# Patient Record
Sex: Female | Born: 1960 | Race: White | Hispanic: No | Marital: Married | Smoking: Former smoker
Health system: Southern US, Community
[De-identification: ages and names within clinical notes are randomized; demographics above are authoritative.]

## PROBLEM LIST (undated history)

## (undated) DIAGNOSIS — C801 Malignant (primary) neoplasm, unspecified: Secondary | ICD-10-CM

## (undated) DIAGNOSIS — I10 Essential (primary) hypertension: Secondary | ICD-10-CM

## (undated) DIAGNOSIS — Z8711 Personal history of peptic ulcer disease: Secondary | ICD-10-CM

## (undated) DIAGNOSIS — F419 Anxiety disorder, unspecified: Secondary | ICD-10-CM

## (undated) DIAGNOSIS — Z8719 Personal history of other diseases of the digestive system: Secondary | ICD-10-CM

## (undated) DIAGNOSIS — R87619 Unspecified abnormal cytological findings in specimens from cervix uteri: Secondary | ICD-10-CM

## (undated) DIAGNOSIS — IMO0002 Reserved for concepts with insufficient information to code with codable children: Secondary | ICD-10-CM

## (undated) DIAGNOSIS — D649 Anemia, unspecified: Secondary | ICD-10-CM

## (undated) DIAGNOSIS — F32A Depression, unspecified: Secondary | ICD-10-CM

## (undated) DIAGNOSIS — N9089 Other specified noninflammatory disorders of vulva and perineum: Secondary | ICD-10-CM

## (undated) DIAGNOSIS — G4730 Sleep apnea, unspecified: Secondary | ICD-10-CM

## (undated) DIAGNOSIS — N871 Moderate cervical dysplasia: Secondary | ICD-10-CM

## (undated) DIAGNOSIS — F329 Major depressive disorder, single episode, unspecified: Secondary | ICD-10-CM

## (undated) DIAGNOSIS — E46 Unspecified protein-calorie malnutrition: Secondary | ICD-10-CM

## (undated) HISTORY — DX: Moderate cervical dysplasia: N87.1

## (undated) HISTORY — PX: CHOLECYSTECTOMY: SHX55

## (undated) HISTORY — DX: Reserved for concepts with insufficient information to code with codable children: IMO0002

## (undated) HISTORY — DX: Unspecified abnormal cytological findings in specimens from cervix uteri: R87.619

## (undated) HISTORY — DX: Other specified noninflammatory disorders of vulva and perineum: N90.89

## (undated) HISTORY — DX: Unspecified protein-calorie malnutrition: E46

## (undated) HISTORY — DX: Essential (primary) hypertension: I10

## (undated) HISTORY — PX: REVISION GASTRIC RESTRICTIVE PROCEDURE FOR MORBID OBESITY: SUR1273

---
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
## 1999-09-02 HISTORY — PX: GASTRIC BYPASS OPEN: SUR638

## 2000-01-19 ENCOUNTER — Emergency Department (HOSPITAL_COMMUNITY): Admission: EM | Admit: 2000-01-19 | Discharge: 2000-01-19 | Payer: Self-pay | Admitting: Emergency Medicine

## 2000-01-19 ENCOUNTER — Encounter: Payer: Self-pay | Admitting: Emergency Medicine

## 2000-09-01 HISTORY — PX: LAPAROSCOPIC CHOLECYSTECTOMY: SUR755

## 2002-09-01 HISTORY — PX: ABDOMINOPLASTY: SUR9

## 2009-01-18 ENCOUNTER — Ambulatory Visit: Payer: Self-pay | Admitting: Hematology & Oncology

## 2009-06-01 HISTORY — PX: LEEP: SHX91

## 2009-08-27 ENCOUNTER — Emergency Department (HOSPITAL_COMMUNITY): Admission: EM | Admit: 2009-08-27 | Discharge: 2009-08-27 | Payer: Self-pay | Admitting: Emergency Medicine

## 2010-12-02 LAB — CBC
HCT: 39.7 % (ref 36.0–46.0)
Hemoglobin: 13.2 g/dL (ref 12.0–15.0)
MCHC: 33.3 g/dL (ref 30.0–36.0)
MCV: 84 fL (ref 78.0–100.0)
RDW: 14.6 % (ref 11.5–15.5)

## 2010-12-02 LAB — COMPREHENSIVE METABOLIC PANEL
Alkaline Phosphatase: 157 U/L — ABNORMAL HIGH (ref 39–117)
BUN: 12 mg/dL (ref 6–23)
Glucose, Bld: 119 mg/dL — ABNORMAL HIGH (ref 70–99)
Potassium: 3.2 mEq/L — ABNORMAL LOW (ref 3.5–5.1)
Total Protein: 6 g/dL (ref 6.0–8.3)

## 2010-12-02 LAB — URINALYSIS, ROUTINE W REFLEX MICROSCOPIC
Ketones, ur: 15 mg/dL — AB
Nitrite: NEGATIVE
Protein, ur: NEGATIVE mg/dL
Urobilinogen, UA: 1 mg/dL (ref 0.0–1.0)
pH: 6 (ref 5.0–8.0)

## 2010-12-02 LAB — DIFFERENTIAL
Basophils Absolute: 0.1 10*3/uL (ref 0.0–0.1)
Basophils Relative: 1 % (ref 0–1)
Monocytes Relative: 6 % (ref 3–12)
Neutro Abs: 8.2 10*3/uL — ABNORMAL HIGH (ref 1.7–7.7)
Neutrophils Relative %: 77 % (ref 43–77)

## 2010-12-02 LAB — POCT I-STAT, CHEM 8
BUN: 15 mg/dL (ref 6–23)
Chloride: 101 mEq/L (ref 96–112)
Creatinine, Ser: 0.6 mg/dL (ref 0.4–1.2)
Glucose, Bld: 122 mg/dL — ABNORMAL HIGH (ref 70–99)
Potassium: 3.1 mEq/L — ABNORMAL LOW (ref 3.5–5.1)

## 2010-12-02 LAB — LACTIC ACID, PLASMA: Lactic Acid, Venous: 1.3 mmol/L (ref 0.5–2.2)

## 2012-08-26 ENCOUNTER — Ambulatory Visit: Payer: Self-pay | Admitting: Family Medicine

## 2012-08-26 VITALS — BP 124/82 | HR 96 | Temp 98.2°F | Resp 18

## 2012-08-26 DIAGNOSIS — K121 Other forms of stomatitis: Secondary | ICD-10-CM

## 2012-08-26 DIAGNOSIS — K140 Glossitis: Secondary | ICD-10-CM

## 2012-08-26 DIAGNOSIS — K0510 Chronic gingivitis, plaque induced: Secondary | ICD-10-CM

## 2012-08-26 DIAGNOSIS — E538 Deficiency of other specified B group vitamins: Secondary | ICD-10-CM

## 2012-08-26 LAB — VITAMIN B12: Vitamin B-12: 382 pg/mL (ref 211–911)

## 2012-08-26 MED ORDER — CYANOCOBALAMIN 1000 MCG/ML IJ SOLN
1000.0000 ug | Freq: Once | INTRAMUSCULAR | Status: AC
  Administered 2012-08-26: 1000 ug via INTRAMUSCULAR

## 2012-08-26 NOTE — Progress Notes (Signed)
Is a 51 year old woman who years ago had a bypass surgery for obesity at which time she was wearing close to 400 pounds. She now weighs 87 pounds.  Over the last month she's developed stomatitis, glossitis, and gingivitis. The pain involves only her mouth but radiates into her years. She also has nasal source. She is miserable. She seen with her son.  Objective: Ulcerations in the nasal opening, gingivitis, stomatitis, and diffuse white patches at along the mucosa of her mouth.  Patient is no acute distress but she is obviously had significant wasting.  Assessment: Stomatitis, gingivitis  Plan: Diflucan 150 by mouth, B12 injections 1 mg every week x4, then every month  Differential Magic mouthwash 4 times a day, Hydromet when necessary every 4 hours

## 2012-09-01 DIAGNOSIS — Z0271 Encounter for disability determination: Secondary | ICD-10-CM

## 2012-09-02 ENCOUNTER — Telehealth: Payer: Self-pay

## 2012-09-02 MED ORDER — FLUCONAZOLE 150 MG PO TABS: 150.0000 mg | ORAL_TABLET | Freq: Once | ORAL | Status: DC

## 2012-09-02 NOTE — Telephone Encounter (Signed)
Patient asking for longer course of diflucan. Please advise.

## 2012-09-02 NOTE — Telephone Encounter (Signed)
I have sent 150mg  Diflucan to the pharmacy.  If she is still symptomatic in one week she will need to RTC for further evaluation

## 2012-09-02 NOTE — Telephone Encounter (Signed)
Yeast infection pills need more than one day to clear this up.   [phone]

## 2012-09-03 NOTE — Telephone Encounter (Signed)
Patient advised this was sent in.

## 2012-09-30 ENCOUNTER — Telehealth: Payer: Self-pay

## 2012-09-30 NOTE — Telephone Encounter (Signed)
Pt said we gave her needles and b12 stuff to take home with her she is out and has no insurance would like to know if she could have more of this [phone]

## 2012-09-30 NOTE — Telephone Encounter (Signed)
Pt said we gave her the materials to do her own b12 shots at home she is out and would like to know if we could supply this again please call [phone]

## 2012-10-01 ENCOUNTER — Telehealth: Payer: Self-pay

## 2012-10-01 MED ORDER — CYANOCOBALAMIN 1000 MCG/ML IJ SOLN: INTRAMUSCULAR | Status: DC

## 2012-10-01 MED ORDER — NEEDLES & SYRINGES MISC: Status: DC

## 2012-10-01 MED ORDER — CYANOCOBALAMIN 1000 MCG/ML IJ SOLN: 1000.0000 ug | Freq: Once | INTRAMUSCULAR | Status: DC

## 2012-10-01 NOTE — Telephone Encounter (Signed)
This is done, called patient to advise these were sent in to [facility]

## 2012-10-01 NOTE — Telephone Encounter (Signed)
Yes.  Please call in vitamin b12 vial of 10 cc, 1mg  per cc, and a box of needles.

## 2012-10-01 NOTE — Addendum Note (Signed)
Addended byCaffie Damme on: 10/01/2012 12:05 PM   Modules accepted: Orders

## 2012-10-01 NOTE — Telephone Encounter (Signed)
Pt called back and I advised her that her Rxs were sent to pharmacy.

## 2012-10-01 NOTE — Telephone Encounter (Signed)
Can we give to pt?

## 2012-10-01 NOTE — Telephone Encounter (Signed)
Pharmacist called for clarification on B12 Rx because it reads to inject "once". After reviewing OV notes and discussing w/Heather I am resending the Rx to read "once a month", which is the instr's in OV notes.

## 2012-10-05 ENCOUNTER — Ambulatory Visit: Payer: Self-pay | Admitting: Family Medicine

## 2012-10-05 VITALS — BP 146/87 | HR 87 | Temp 97.8°F | Resp 16 | Ht 64.75 in | Wt 101.4 lb

## 2012-10-05 DIAGNOSIS — B370 Candidal stomatitis: Secondary | ICD-10-CM

## 2012-10-05 DIAGNOSIS — K1379 Other lesions of oral mucosa: Secondary | ICD-10-CM

## 2012-10-05 DIAGNOSIS — N898 Other specified noninflammatory disorders of vagina: Secondary | ICD-10-CM

## 2012-10-05 MED ORDER — FLUCONAZOLE 150 MG PO TABS: 150.0000 mg | ORAL_TABLET | Freq: Once | ORAL | Status: DC

## 2012-10-05 MED ORDER — TRAMADOL HCL 50 MG PO TABS: 50.0000 mg | ORAL_TABLET | Freq: Three times a day (TID) | ORAL | Status: DC | PRN

## 2012-10-05 NOTE — Progress Notes (Signed)
52 yo woman with recurrence of mouth sores.  She did better with the Diflucan, but the soreness has returned.  Also, has sore on labia. Ears feel blocked. S/P gastric bypass, but weight has stabilized.  No polyuria  She also has left labial sore that has started as nonpainful growth about a year.  It has gotten sore over the last two months.  Objective:  NAD Broken tooth number 8, white patches in mouth c/w thrush. Ears: no cerumen, dull TM's   2 cm eroding lesion left labia with overlying yellow eschar  Assessment:  Vulvar cancer, thrush  Plan:  1. Thrush  fluconazole (DIFLUCAN) 150 MG tablet  2. Oral pain  traMADol (ULTRAM) 50 MG tablet  3. Vaginal lesion  Ambulatory referral to Gynecology

## 2012-10-20 ENCOUNTER — Telehealth: Payer: Self-pay

## 2012-10-20 NOTE — Telephone Encounter (Signed)
Pt is wanting someone to call [facility] to see if they can move up her appt from April for a lesion  Best number [phone]

## 2012-10-20 NOTE — Telephone Encounter (Signed)
Can you see if we can get her in sooner?  

## 2012-10-27 ENCOUNTER — Encounter: Payer: Self-pay | Admitting: Obstetrics & Gynecology

## 2012-11-03 ENCOUNTER — Telehealth: Payer: Self-pay

## 2012-11-03 DIAGNOSIS — K1379 Other lesions of oral mucosa: Secondary | ICD-10-CM

## 2012-11-03 MED ORDER — TRAMADOL HCL 50 MG PO TABS: 50.0000 mg | ORAL_TABLET | Freq: Three times a day (TID) | ORAL | Status: DC | PRN

## 2012-11-03 NOTE — Telephone Encounter (Signed)
He sent this in with 3 refills tramadol she is using this more than prescribed she uses this more frequently than every 8 hours. Please advise if we can send in something else for her.

## 2012-11-03 NOTE — Telephone Encounter (Signed)
Pt needs a refill on pain medication.  She is a patient of Dr. Milus Glazier.  She found out she has a tumor and won't be able to get in to see her other doctor for a while.  Hopes we can refill. [phone]

## 2012-11-03 NOTE — Telephone Encounter (Signed)
I have sent tramadol to the pharmacy.  Pt needs to take as directed.  And pt needs to f/u with Dr. Milus Glazier for more.

## 2012-11-04 NOTE — Telephone Encounter (Signed)
Spoke with pt, advised Rx sent to pharmacy and to follow up with Dr Milus Glazier.

## 2012-11-05 ENCOUNTER — Other Ambulatory Visit: Payer: Self-pay | Admitting: Family Medicine

## 2012-11-10 ENCOUNTER — Telehealth: Payer: Self-pay

## 2012-11-10 NOTE — Telephone Encounter (Signed)
Dr Milus Glazier  Patient does not have insurance  Requesting fluconazole (DIFLUCAN) 150 MG tablet - Was being treated for yeast infection in mouth.  Has an appointment next week with a specialist.   [phone]

## 2012-11-11 ENCOUNTER — Other Ambulatory Visit: Payer: Self-pay | Admitting: Family Medicine

## 2012-11-11 DIAGNOSIS — B370 Candidal stomatitis: Secondary | ICD-10-CM

## 2012-11-11 MED ORDER — FLUCONAZOLE 150 MG PO TABS: 150.0000 mg | ORAL_TABLET | Freq: Once | ORAL | Status: DC

## 2012-11-17 ENCOUNTER — Encounter: Payer: Self-pay | Admitting: Gynecologic Oncology

## 2012-11-18 ENCOUNTER — Encounter: Payer: Self-pay | Admitting: Gynecologic Oncology

## 2012-11-18 ENCOUNTER — Ambulatory Visit: Payer: Self-pay | Attending: Gynecologic Oncology | Admitting: Gynecologic Oncology

## 2012-11-18 VITALS — BP 120/74 | HR 86 | Temp 98.4°F | Resp 20 | Ht 66.0 in | Wt 99.7 lb

## 2012-11-18 DIAGNOSIS — C519 Malignant neoplasm of vulva, unspecified: Secondary | ICD-10-CM | POA: Insufficient documentation

## 2012-11-18 NOTE — Progress Notes (Signed)
Consult Note: Gyn-Onc  Autumn Turner 52 y.o. female  CC:  Chief Complaint  Patient presents with  . Vulvar Cancer    New patient    HPI: Patient is seen today in consultation at the request of Dr. Richardson for newly diagnosed vulvar cancer.  Patient is a 52-year-old gravida 2 has been having itching and pain in the vulva for more than 6 months. She states it would flare about every 3-4 months with burning and irritation and then go away. Past summer of 2013 it flared much more significantly and she had significant increase in pain and itching. She looks that the lesion with the mirror she sought white exudate and she took a fungal medication that needed improved. She got that was most likely yeast infection as the antifungal medication helped. She went back to the physician to get a refill on the fungal medication and her daughter encouraged her to have a pelvic exam. At that time the pelvic exam revealed a probable vulvar cancer she was referred to Dr. Richardson.  Dr. 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 Dr. Richardson was not able to do a Pap smear at that time as the patient refused. The patient is a history of abnormal Pap smears and underwent a LEEP in 2010 and had one normal Pap smear in 2012 in his had no followup since that time as she has lost her insurance. She underwent a gastric bypass in 2001 and in 2010 she had complications from her bypass to scarring and could not eat and became no nourished her weight is 85 pounds. She revision of her bypass in May of 2013 with lysis of adhesions and was able to gain back to just under 100 pounds has lost weight again. She states that she has fairly significant nausea she has difficulty keeping her food and without growing up. It sounds report that she has a gastric ulcers and may have developed a gastric outlet obstruction. Today her weight is fairly  stable.  Review of Systems: She denies any vaginal bleeding. She has a bleeding from the ulcer. She has a significant change about bladder habits. She does have gastric cramping and abdominal pain. She has pain from the lesion and she did not use the lidocaine jelly as it cannot really relieve her symptoms. She's never had a mammogram. Her last colonoscopy was in 2013. She does smoke and smokes half a pack to pack per day she's temperature 20 years. 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 She states that due to her malnutrition and weakness she's not able to work a full day.  Current Meds:  Outpatient Encounter Prescriptions as of 11/18/2012  Medication Sig Dispense Refill  . cyanocobalamin (,VITAMIN B-12,) 1000 MCG/ML injection Inject 1 mL (1,000 mcg total) into the muscle once a month.  10 mL  0  . Needles & Syringes MISC 3 cc syringes with 25 gauge  1/2 needle  1 each  0  . fluconazole (DIFLUCAN) 150 MG tablet Take 1 tablet (150 mg total) by mouth once. Repeat if needed  7 tablet  2  . traMADol (ULTRAM) 50 MG tablet Take 1 tablet (50 mg total) by mouth every 8 (eight) hours as needed for pain.  30 tablet  0   No facility-administered encounter medications   on file as of 11/18/2012.    Allergy:  Allergies  Allergen Reactions  . Ciprocinonide (Fluocinolone)   . Iohexol      Code: HIVES   . Nsaids     Due to Hx of gastric bypass    Social Hx:   History   Social History  . Marital Status: Divorced    Spouse Name: N/A    Number of Children: N/A  . Years of Education: N/A   Occupational History  . Not on file.   Social History Main Topics  . Smoking status: Current Every Day Smoker -- 0.50 packs/day for 20 years    Types: Cigarettes  . Smokeless tobacco: Not on file  . Alcohol Use: No  . Drug Use: No  . Sexually Active: Not on file   Other Topics Concern   . Not on file   Social History Narrative  . No narrative on file    Past Surgical Hx:  Past Surgical History  Procedure Laterality Date  . Cesarean section    . Gastric bypass  2001  . Gallbladder surgery  2002  . Tummy tuck  2004  . Leep  06/2009    Past Medical Hx:  Past Medical History  Diagnosis Date  . Hypertension   . Abnormal Pap smear   . Vulvar lesion   . CIN II (cervical intraepithelial neoplasia II)   . Malnourished     Family Hx:  Family History  Problem Relation Age of Onset  . Cancer Father   . Hypertension Father   . Lung cancer Father     Vitals:  Blood pressure 120/74, pulse 86, temperature 98.4 F (36.9 C), resp. rate 20, height 5' 6" (1.676 m), weight 99 lb 11.2 oz (45.224 kg).  Physical Exam: Thin ill-appearing female in no acute distress.  Neck: Supple, no lymphadenopathy, no thyromegaly.  Lungs: Clear to auscultation bilaterally.  Cardiac Astro: Regular rate rhythm.  Abdomen: Well-healed surgical incisions and drain sites. Abdomen is soft, nontender, nondistended. There is no palpable masses or hepatosplenomegaly. Evidence of a plastic surgery procedures evident.  Groins: 2 (2 pulse is no lymphadenopathy.  Extremities: No edema.  Pelvic: External genitalia is notable for a 3 x 2 cm ulcerative lesion arising in the left inferior aspect of the labia majora crossing the perineal body at the midline. There is approximate centimeter and a half from the anal verge. Patient is not tolerate a digital vaginal finger or speculum examination. Rectal was performed. Rectal on for this patient does have a rectocele grade 1 and on rectal exam I was able to lift up the vaginal mucosa showed that the vaginal mucosa is not involved.  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 I believe she is a candidate for a radical vulvectomy and bilateral groin dissection. I spoke to the patient regarding the  surgery and postoperative care that would be required. She understands this lesion does cross midline that she'll require a bilateral vulvectomy's with drains. She is prior experience managing and taking care of drains she's comfortable with this. We also discussed postoperative care. Her surgery is scheduled for April 8. She understands that Dr. Wendy Brewster will be her surgeon and she'll have the opportunity to see her the day of surgery. She will have a Pap smear under anesthesia. Her questions were elicited in answer to her satisfaction. Risks of surgery including bleeding, infection, injury to surrounding organs, vulvar breakdown, lymphedema were discussed with   the patient. She wishes to proceed. She was given a prescription today for narcotics. She'll come in for preop visit. Smoking cessation was encouraged.  Farhana Fellows A., MD 11/18/2012, 3:06 PM  

## 2012-11-18 NOTE — Patient Instructions (Signed)
Vulvectomy  Care After The vulva is the external female genitalia, outside and around the vagina and pubic bone. It consists of:  The skin on, and in front of, the pubic bone.  The clitoris.  The labia majora (large lips) on the outside of the vagina.  The labia minora (small lips) around the opening of the vagina.  The opening and the skin in and around the vagina. A vulvectomy is the removal of the tissue of the vulva, which sometimes includes removal of the lymph nodes and tissue in the groin areas. These discharge instructions provide you with general information on caring for yourself after you leave the hospital. It is also important that you know the warning signs of complications, so that you can seek treatment. Please read the instructions outlined below and refer to this sheet in the next few weeks. Your caregiver may also give you specific information and medicines. If you have any questions or complications after discharge, please call your caregiver. ACTIVITY  Rest as much as possible the first two weeks after discharge.  Arrange to have help from family or others with your daily activities when you go home.  Avoid heavy lifting (more than 5 pounds), pushing, or pulling.  If you feel tired, balance your activity with rest periods.  Follow your caregiver's instruction about climbing stairs and driving a car.  Increase activity gradually.  Do not exercise until you have permission from your caregiver. LEG AND FOOT CARE If your doctor has removed lymph nodes from your groin area, there may be an increase in swelling of your legs and feet. You can help prevent swelling by doing the following:  Elevate your legs while sitting or lying down.  If your caregiver has ordered special stockings, wear them according to instructions.  Avoid standing in one place for long periods of time.  Call the physical therapy department if you have any questions about swelling or treatment  for swelling.  Avoid salt in your diet. It can cause fluid retention and swelling.  Do not cross your legs, especially when sitting. NUTRITION  You may resume your normal diet.  Drink 6 to 8 glasses of fluids a day.  Eat a healthy, balanced diet including portions of food from the meat (protein), milk, fruit, vegetable, and bread groups.  Your caregiver may recommend you take a multivitamin with iron. ELIMINATION  You may notice that your stream of urine is at a different angle, and may tend to spray. Using a plastic funnel may help to decrease urine spray.  If constipation occurs, drink more liquids, and add more fruits, vegetables, and bran to your diet. You may take a mild laxative, such as Milk of Magnesia, Metamucil or a stool softener, such as Colace with permission from your caregiver. HYGIENE  You may shower and wash your hair.  Check with your caregiver about tub baths.  Do not add any bath oils or chemicals to your bath water, after you have permission to take baths.  Clean yourself well after moving your bowels.  After urinating, wipe from top to bottom.  A sitz bath will help keep your perineal area clean, reduce swelling, and provide comfort. HOME CARE INSTRUCTIONS   Take your temperature twice a day and record it, especially if you feel feverish or have chills.  Follow your caregiver's instructions about medicines, activity, and follow-up appointments after surgery.  Do not drink alcohol while taking pain medicine.  Change your dressing as advised by your caregiver.    You may take over-the-counter medicine for pain, recommended by your caregiver.  If your pain is not relieved with medicine, call your caregiver.  Do not take aspirin, because it can cause bleeding.  Do not douche or use tampons (use a non-perfumed sanitary pad).  Do not have sexual intercourse until your caregiver gives you permission. Hugging, kissing, and playful sexual activity is  fine with your caregiver's permission.  Warm sitz baths, with your caregiver's permission, are helpful to control swelling and discomfort.  Take showers instead of baths, until your caregiver gives you permission to take baths.  You may take a mild medicine for constipation, recommended by your caregiver. Bran foods and drinking a lot of fluids will help with constipation.  Make sure your family understands everything about your operation and recovery. SEEK MEDICAL CARE IF:   You notice swelling and redness around the wound area.  You notice a foul smell coming from the wound or on the surgical dressing.  You notice the wound is separating.  You have painful or bloody urination.  You develop nausea and vomiting.  You develop diarrhea.  You develop a rash.  You have a reaction or allergy from the medicine.  You feel dizzy or light headed.  You need stronger pain medicine. SEEK IMMEDIATE MEDICAL CARE IF:   You develop a temperature of 102 F (38.9 C) or higher.  You pass out.  You develop leg or chest pain.  You develop abdominal pain.  You develop shortness of breath.  You develop bleeding from the wound area.  You see pus in the wound area. MAKE SURE YOU:   Understand these instructions.  Will watch your condition.  Will get help right away if you are not doing well or get worse. Document Released: 04/01/2004 Document Revised: 11/10/2011 Document Reviewed: 07/20/2009 ExitCare Patient Information 2013 [employer].  

## 2012-11-29 ENCOUNTER — Encounter (HOSPITAL_COMMUNITY): Payer: Self-pay | Admitting: Pharmacy Technician

## 2012-12-02 ENCOUNTER — Other Ambulatory Visit (HOSPITAL_COMMUNITY): Payer: Self-pay | Admitting: Gynecologic Oncology

## 2012-12-02 NOTE — Patient Instructions (Signed)
[name]  12/02/2012   Your procedure is scheduled on: 12-07-2012  Report to [facility] at 1045 AM.  Call this number if you have problems the morning of surgery [phone]   Remember:   Do not eat food or drink liquids :After Midnight.     Take these medicines the morning of surgery with A SIP OF WATER:                                 SEE [facility] PREPARING FOR SURGERY SHEET   Do not wear jewelry, make-up or nail polish.  Do not wear lotions, powders, or perfumes. You may wear deodorant.   Men may shave face and neck.  Do not bring valuables to the hospital.  Contacts, dentures or bridgework may not be worn into surgery.  Leave suitcase in the car. After surgery it may be brought to your room.  For patients admitted to the hospital, checkout time is 11:00 AM the day of discharge.   Patients discharged the day of surgery will not be allowed to drive home.  Name and phone number of your driver:  Special Instructions: N/A   Please read over the following fact sheets that you were given: MRSA Information., incentive spirometer fact sheet Call Birdie Sons RN pre op nurse if needed [phone]    FAILURE TO FOLLOW THESE INSTRUCTIONS MAY RESULT IN THE CANCELLATION OF YOUR SURGERY. PATIENT SIGNATURE___________________________________________

## 2012-12-03 ENCOUNTER — Encounter (HOSPITAL_COMMUNITY)
Admission: RE | Admit: 2012-12-03 | Discharge: 2012-12-03 | Disposition: A | Discharge status: Home / Self Care | Payer: Self-pay | Source: Ambulatory Visit | Attending: Gynecologic Oncology | Admitting: Gynecologic Oncology

## 2012-12-03 ENCOUNTER — Encounter (HOSPITAL_COMMUNITY): Payer: Self-pay

## 2012-12-03 ENCOUNTER — Ambulatory Visit (HOSPITAL_COMMUNITY)
Admission: RE | Admit: 2012-12-03 | Discharge: 2012-12-03 | Disposition: A | Discharge status: Home / Self Care | Payer: Self-pay | Source: Ambulatory Visit | Attending: Gynecologic Oncology | Admitting: Gynecologic Oncology

## 2012-12-03 DIAGNOSIS — C519 Malignant neoplasm of vulva, unspecified: Secondary | ICD-10-CM | POA: Insufficient documentation

## 2012-12-03 DIAGNOSIS — R9431 Abnormal electrocardiogram [ECG] [EKG]: Secondary | ICD-10-CM | POA: Insufficient documentation

## 2012-12-03 DIAGNOSIS — Z01812 Encounter for preprocedural laboratory examination: Secondary | ICD-10-CM | POA: Insufficient documentation

## 2012-12-03 DIAGNOSIS — Z01810 Encounter for preprocedural cardiovascular examination: Secondary | ICD-10-CM | POA: Insufficient documentation

## 2012-12-03 DIAGNOSIS — Z01818 Encounter for other preprocedural examination: Secondary | ICD-10-CM | POA: Insufficient documentation

## 2012-12-03 HISTORY — DX: Malignant (primary) neoplasm, unspecified: C80.1

## 2012-12-03 HISTORY — DX: Anemia, unspecified: D64.9

## 2012-12-03 HISTORY — DX: Sleep apnea, unspecified: G47.30

## 2012-12-03 LAB — COMPREHENSIVE METABOLIC PANEL
ALT: 12 U/L (ref 0–35)
AST: 23 U/L (ref 0–37)
CO2: 32 mEq/L (ref 19–32)
Chloride: 104 mEq/L (ref 96–112)
GFR calc Af Amer: >90 mL/min (ref >90)
GFR calc non Af Amer: >90 mL/min (ref >90)
Glucose, Bld: 93 mg/dL (ref 70–99)
Sodium: 144 mEq/L (ref 135–145)
Total Bilirubin: 0.1 mg/dL — ABNORMAL LOW (ref 0.3–1.2)

## 2012-12-03 LAB — CBC WITH DIFFERENTIAL/PLATELET
Basophils Relative: 2 % — ABNORMAL HIGH (ref 0–1)
Eosinophils Relative: 6 % — ABNORMAL HIGH (ref 0–5)
Hemoglobin: 8.6 g/dL — ABNORMAL LOW (ref 12.0–15.0)
Lymphs Abs: 1.6 10*3/uL (ref 0.7–4.0)
MCH: 18 pg — ABNORMAL LOW (ref 26.0–34.0)
MCV: 64.5 fL — ABNORMAL LOW (ref 78.0–100.0)
Monocytes Absolute: 0.4 10*3/uL (ref 0.1–1.0)
Monocytes Relative: 7 % (ref 3–12)
Neutrophils Relative %: 59 % (ref 43–77)
RBC: 4.79 MIL/uL (ref 3.87–5.11)

## 2012-12-03 NOTE — Pre-Procedure Instructions (Addendum)
[name]  12/03/2012   Your procedure is scheduled on:  Tuesday, April 08,2014  Report to [facility] at  AM.1045  Call this number if you have problems the morning of surgery: [phone]   Remember:   Do not drink liquids or  eat food:After Midnight. Clear liquids until 0715 12-07-2012 then Nothing by mouth until surgery      Take these medicines the morning of surgery with A SIP OF WATER: Hydrocodone if needed                          SEE [facility] PREPARING FOR SURGERY SHEET    Do not wear jewelry, make-up or nail polish.  Do not wear lotions, powders, or perfumes. You may wear deodorant.             Men may shave face and neck.  Do not bring valuables to the hospital.  Contacts, dentures or bridgework may not be worn into surgery.  Leave suitcase in the car. After surgery it may be brought to your room.  For patients admitted to the hospital, checkout time is 11:00 AM the day of                         discharge.   Patients discharged the day of surgery will not be allowed to drive home.  Name and phone number of your driver: Lajuan Lines [phone]     Please read over the following fact sheets that you were given:MRSA Information,Incentive spirometry                    Call Jolyn Nap, RN pre op nurse if needed [phone]               FAILURE TO FOLLOW THESE INSTRUCTIONS MAY RESULT IN THE CANCELLATION OF YOUR SURGERY. PATIENT SIGNATURE___________________________________________________

## 2012-12-06 NOTE — Progress Notes (Addendum)
Late entry for 12-03-2012 abnormal labs reported to Dagmar Hait, R.N. For Dr. Nelly Rout CBC w diff, CMET and PCR screen. 12-06-2012 Spoke with Dr. Forrestine Him office this am for follow up no additional orders needed for surgery will proceed, aware that patient will be on contact isolation because of positive PCR screen started her treatment Friday evening. EKG confirmed from 12-03-2012  and CXR final report from  12-03-2012 in Epic.

## 2012-12-07 ENCOUNTER — Encounter (HOSPITAL_COMMUNITY)
Admission: RE | Disposition: A | Discharge status: Home / Self Care | Payer: Self-pay | Source: Ambulatory Visit | Attending: Gynecologic Oncology

## 2012-12-07 ENCOUNTER — Encounter (HOSPITAL_COMMUNITY): Payer: Self-pay | Admitting: *Deleted

## 2012-12-07 ENCOUNTER — Ambulatory Visit (HOSPITAL_COMMUNITY): Payer: Self-pay | Admitting: *Deleted

## 2012-12-07 ENCOUNTER — Ambulatory Visit (HOSPITAL_COMMUNITY)
Admission: RE | Admit: 2012-12-07 | Discharge: 2012-12-08 | Disposition: A | Discharge status: Home / Self Care | Payer: Self-pay | Source: Ambulatory Visit | Attending: Obstetrics & Gynecology | Admitting: Obstetrics & Gynecology

## 2012-12-07 DIAGNOSIS — R110 Nausea: Secondary | ICD-10-CM | POA: Insufficient documentation

## 2012-12-07 DIAGNOSIS — R5381 Other malaise: Secondary | ICD-10-CM | POA: Insufficient documentation

## 2012-12-07 DIAGNOSIS — E46 Unspecified protein-calorie malnutrition: Secondary | ICD-10-CM | POA: Insufficient documentation

## 2012-12-07 DIAGNOSIS — C519 Malignant neoplasm of vulva, unspecified: Secondary | ICD-10-CM

## 2012-12-07 DIAGNOSIS — K259 Gastric ulcer, unspecified as acute or chronic, without hemorrhage or perforation: Secondary | ICD-10-CM | POA: Insufficient documentation

## 2012-12-07 DIAGNOSIS — Z9884 Bariatric surgery status: Secondary | ICD-10-CM | POA: Insufficient documentation

## 2012-12-07 DIAGNOSIS — I10 Essential (primary) hypertension: Secondary | ICD-10-CM | POA: Insufficient documentation

## 2012-12-07 HISTORY — PX: VULVECTOMY: SHX1086

## 2012-12-07 SURGERY — VULVECTOMY, WITH LYMPHADENECTOMY
Anesthesia: General | Laterality: Bilateral | Wound class: Clean Contaminated

## 2012-12-07 MED ORDER — NEOSTIGMINE METHYLSULFATE 1 MG/ML IJ SOLN
INTRAMUSCULAR | Status: DC | PRN
  Administered 2012-12-07: 3 mg via INTRAVENOUS

## 2012-12-07 MED ORDER — BUPIVACAINE HCL (PF) 0.25 % IJ SOLN
INTRAMUSCULAR | Status: DC | PRN
  Administered 2012-12-07: 10 mL

## 2012-12-07 MED ORDER — HYDROMORPHONE HCL PF 1 MG/ML IJ SOLN
0.5000 mg | INTRAMUSCULAR | Status: DC | PRN
  Administered 2012-12-08: 1 mg via INTRAVENOUS
  Filled 2012-12-07 (×2): qty 1

## 2012-12-07 MED ORDER — FENTANYL CITRATE 0.05 MG/ML IJ SOLN: 25.0000 ug | INTRAMUSCULAR | Status: DC | PRN

## 2012-12-07 MED ORDER — HYDROMORPHONE HCL PF 1 MG/ML IJ SOLN
INTRAMUSCULAR | Status: DC | PRN
  Administered 2012-12-07 (×2): 1 mg via INTRAVENOUS

## 2012-12-07 MED ORDER — ACETAMINOPHEN 10 MG/ML IV SOLN
INTRAVENOUS | Status: AC
  Filled 2012-12-07: qty 100

## 2012-12-07 MED ORDER — ACETAMINOPHEN 10 MG/ML IV SOLN
INTRAVENOUS | Status: DC | PRN
  Administered 2012-12-07: 1000 mg via INTRAVENOUS

## 2012-12-07 MED ORDER — MIDAZOLAM HCL 5 MG/5ML IJ SOLN
INTRAMUSCULAR | Status: DC | PRN
  Administered 2012-12-07: 2 mg via INTRAVENOUS

## 2012-12-07 MED ORDER — LACTATED RINGERS IV SOLN
INTRAVENOUS | Status: DC | PRN
  Administered 2012-12-07 (×2): via INTRAVENOUS

## 2012-12-07 MED ORDER — LIDOCAINE HCL (PF) 2 % IJ SOLN
INTRAMUSCULAR | Status: DC | PRN
  Administered 2012-12-07: 10 mL

## 2012-12-07 MED ORDER — ACETAMINOPHEN 10 MG/ML IV SOLN
1000.0000 mg | Freq: Four times a day (QID) | INTRAVENOUS | Status: DC
  Administered 2012-12-07: 1000 mg via INTRAVENOUS
  Filled 2012-12-07 (×2): qty 100

## 2012-12-07 MED ORDER — LIDOCAINE HCL 2 % IJ SOLN
INTRAMUSCULAR | Status: AC
  Filled 2012-12-07: qty 20

## 2012-12-07 MED ORDER — OXYCODONE-ACETAMINOPHEN 5-325 MG PO TABS
1.0000 | ORAL_TABLET | ORAL | Status: DC | PRN
  Administered 2012-12-07 (×2): 2 via ORAL
  Administered 2012-12-08: 1 via ORAL
  Administered 2012-12-08 (×3): 2 via ORAL
  Filled 2012-12-07: qty 2
  Filled 2012-12-07: qty 1
  Filled 2012-12-07 (×4): qty 2

## 2012-12-07 MED ORDER — GLYCOPYRROLATE 0.2 MG/ML IJ SOLN
INTRAMUSCULAR | Status: DC | PRN
  Administered 2012-12-07: 0.4 mg via INTRAVENOUS

## 2012-12-07 MED ORDER — FENTANYL CITRATE 0.05 MG/ML IJ SOLN
INTRAMUSCULAR | Status: AC
  Filled 2012-12-07: qty 2

## 2012-12-07 MED ORDER — HYDROMORPHONE HCL PF 1 MG/ML IJ SOLN
0.2500 mg | INTRAMUSCULAR | Status: DC | PRN
  Administered 2012-12-07: 0.5 mg via INTRAVENOUS

## 2012-12-07 MED ORDER — ROCURONIUM BROMIDE 100 MG/10ML IV SOLN
INTRAVENOUS | Status: DC | PRN
  Administered 2012-12-07: 40 mg via INTRAVENOUS

## 2012-12-07 MED ORDER — DEXAMETHASONE SODIUM PHOSPHATE 10 MG/ML IJ SOLN
INTRAMUSCULAR | Status: DC | PRN
  Administered 2012-12-07: 5 mg via INTRAVENOUS

## 2012-12-07 MED ORDER — ONDANSETRON HCL 4 MG/2ML IJ SOLN
INTRAMUSCULAR | Status: DC | PRN
  Administered 2012-12-07: 4 mg via INTRAVENOUS

## 2012-12-07 MED ORDER — KCL IN DEXTROSE-NACL 20-5-0.45 MEQ/L-%-% IV SOLN
INTRAVENOUS | Status: DC
  Administered 2012-12-07 – 2012-12-08 (×2): via INTRAVENOUS
  Filled 2012-12-07 (×4): qty 1000

## 2012-12-07 MED ORDER — PROPOFOL 10 MG/ML IV BOLUS
INTRAVENOUS | Status: DC | PRN
  Administered 2012-12-07: 150 mg via INTRAVENOUS

## 2012-12-07 MED ORDER — LIDOCAINE HCL (CARDIAC) 20 MG/ML IV SOLN
INTRAVENOUS | Status: DC | PRN
  Administered 2012-12-07: 50 mg via INTRAVENOUS

## 2012-12-07 MED ORDER — PROMETHAZINE HCL 25 MG/ML IJ SOLN: 6.2500 mg | INTRAMUSCULAR | Status: DC | PRN

## 2012-12-07 MED ORDER — HYDROMORPHONE HCL PF 1 MG/ML IJ SOLN
INTRAMUSCULAR | Status: AC
  Filled 2012-12-07: qty 1

## 2012-12-07 MED ORDER — LACTATED RINGERS IV SOLN: INTRAVENOUS | Status: DC

## 2012-12-07 MED ORDER — ZOLPIDEM TARTRATE 5 MG PO TABS: 5.0000 mg | ORAL_TABLET | Freq: Every evening | ORAL | Status: DC | PRN

## 2012-12-07 MED ORDER — BUPIVACAINE HCL (PF) 0.25 % IJ SOLN
INTRAMUSCULAR | Status: AC
  Filled 2012-12-07: qty 30

## 2012-12-07 MED ORDER — MEPERIDINE HCL 50 MG/ML IJ SOLN: 6.2500 mg | INTRAMUSCULAR | Status: DC | PRN

## 2012-12-07 MED ORDER — PHENYLEPHRINE HCL 10 MG/ML IJ SOLN
INTRAMUSCULAR | Status: DC | PRN
  Administered 2012-12-07: 80 ug via INTRAVENOUS

## 2012-12-07 MED ORDER — FENTANYL CITRATE 0.05 MG/ML IJ SOLN
INTRAMUSCULAR | Status: DC | PRN
  Administered 2012-12-07 (×7): 50 ug via INTRAVENOUS

## 2012-12-07 SURGICAL SUPPLY — 42 items
APL SKNCLS STERI-STRIP NONHPOA (GAUZE/BANDAGES/DRESSINGS) ×2
BANDAGE GAUZE ELAST BULKY 4 IN (GAUZE/BANDAGES/DRESSINGS) ×1 IMPLANT
BENZOIN TINCTURE PRP APPL 2/3 (GAUZE/BANDAGES/DRESSINGS) ×2 IMPLANT
BLADE SURG 15 STRL LF DISP TIS (BLADE) ×2 IMPLANT
BLADE SURG 15 STRL SS (BLADE) ×2
CANISTER SUCTION 2500CC (MISCELLANEOUS) ×2 IMPLANT
CLOTH BEACON ORANGE TIMEOUT ST (SAFETY) ×2 IMPLANT
CLSR STERI-STRIP ANTIMIC 1/2X4 (GAUZE/BANDAGES/DRESSINGS) ×1 IMPLANT
COVER SURGICAL LIGHT HANDLE (MISCELLANEOUS) ×1 IMPLANT
DRAIN CHANNEL 10M FLAT 3/4 FLT (DRAIN) ×4 IMPLANT
DRAPE LG THREE QUARTER DISP (DRAPES) ×4 IMPLANT
DRSG TEGADERM 4X4.75 (GAUZE/BANDAGES/DRESSINGS) ×1 IMPLANT
ELECT NEEDLE TIP 2.8 STRL (NEEDLE) ×2 IMPLANT
EVACUATOR SILICONE 100CC (DRAIN) ×4 IMPLANT
GLOVE BIO SURGEON STRL SZ 6.5 (GLOVE) ×1 IMPLANT
GLOVE BIO SURGEON STRL SZ7.5 (GLOVE) ×7 IMPLANT
GLOVE BIOGEL PI IND STRL 6.5 (GLOVE) ×2 IMPLANT
GLOVE BIOGEL PI IND STRL 7.5 (GLOVE) IMPLANT
GLOVE BIOGEL PI INDICATOR 6.5 (GLOVE) ×2
GLOVE BIOGEL PI INDICATOR 7.5 (GLOVE) ×3
GLOVE INDICATOR 8.0 STRL GRN (GLOVE) ×2 IMPLANT
GOWN PREVENTION PLUS XLARGE (GOWN DISPOSABLE) ×1 IMPLANT
GOWN STRL NON-REIN LRG LVL3 (GOWN DISPOSABLE) ×8 IMPLANT
NDL SAFETY ECLIPSE 18X1.5 (NEEDLE) IMPLANT
NEEDLE HYPO 18GX1.5 SHARP (NEEDLE) ×2
NS IRRIG 1000ML POUR BTL (IV SOLUTION) ×1 IMPLANT
PACK COOL COMFORT PERI COLD (SET/KITS/TRAYS/PACK) ×6 IMPLANT
PACK MINOR VAGINAL W LONG (CUSTOM PROCEDURE TRAY) ×2 IMPLANT
PENCIL BUTTON HOLSTER BLD 10FT (ELECTRODE) ×2 IMPLANT
SHEET LAVH (DRAPES) ×1 IMPLANT
SPONGE GAUZE 4X4 12PLY (GAUZE/BANDAGES/DRESSINGS) ×2 IMPLANT
SPONGE LAP 18X18 X RAY DECT (DISPOSABLE) ×4 IMPLANT
STRIP CLOSURE SKIN 1/2X4 (GAUZE/BANDAGES/DRESSINGS) ×2 IMPLANT
SUT ETHILON 3 0 PS 1 (SUTURE) ×4 IMPLANT
SUT MNCRL AB 3-0 PS2 18 (SUTURE) ×4 IMPLANT
SUT VIC AB 2-0 SH 27 (SUTURE) ×14
SUT VIC AB 2-0 SH 27X BRD (SUTURE) IMPLANT
SUT VIC AB 3-0 SH 27 (SUTURE) ×8
SUT VIC AB 3-0 SH 27X BRD (SUTURE) IMPLANT
SYR CONTROL 10ML LL (SYRINGE) ×2 IMPLANT
TOWEL NATURAL 10PK STERILE (DISPOSABLE) ×1 IMPLANT
TOWEL OR NON WOVEN STRL DISP B (DISPOSABLE) ×2 IMPLANT

## 2012-12-07 NOTE — Op Note (Signed)
3:24 PM  PATIENT:  Autumn Turner  52 y.o. female  PRE-OPERATIVE DIAGNOSIS:  VULVAR CANCER   POST-OPERATIVE DIAGNOSIS:  VULVAR CANCER   PROCEDURE:  Procedure(s): VULVECTOMY AND BILATERAL INGUINAL LYMPH NODE DISSECTION, PAP SMEAR (Bilateral) Pap test  SURGEON:  Surgeon(s) and Role:    * Laurette Schimke, MD - Primary    * Antionette Char, MD - Assisting  ANESTHESIA:   General  FINDINGS:  3 cm perineal mass predominantly left-sided.  No inguinal inguinal adenopathy normal appearing cervix;  PROCEDURE:  Autumn Turner was taken to the operating room and the appropriate time out procedures performed. She was intubated and then placed in the dorsolithotomy position.  A Pap test was collected inguinal lymph nodes were assessed and were noted to be negative she was then prepped and draped in usual sterile fashion.  An 8 cm incision was made in the right inguinal crease. Dissection was taken down to the adductor muscle fascia and the sartorius muscle fascia. Skin flaps were created and were inferior to Scarpa's fascia.  Nodal tissue at this period aspect of the inguinal canal was dissected. Nodal tissue deep within the femoral triangle was also dissected. Large vessels were ligated. The saphenous vein was identified as it coursed towards the inguinal fossa. Nodal tissue was dissected off this vessel and the vein was preserved. The nodal tissue medial to the vessels in no case area were resected .  The surgical site was copiously irrigated and drained.  A JP drain was placed within the area of dissection. The drain was secured with nylon. The subcutaneou tissues were reapproximated with a running 2-0 Vicryl suture.  The skin was closed with a running subcuticular 3-0 Vicryl.  On the left similarly an 8 cm incision was made. Flaps were created both inferiorly and superiorly for a depth of at least 4 mm an inferior to Scarpa's fascia.  Nodal tissue was removed from the area of the inguinal canal and  to the femoral triangle. The left saphenous vein was identified as it coursed medially to the inguinal fossa. The saphenous vein was preserved. Tributaries to this vessel were ligated with suture. The nodal tissue medial to the inguinal vein that was resected as was the tissue in the area for case node. The area was copiously irrigated and drained hemostasis was assured. A flat J-P drain was then placed in the area of dissection and TRAM secured to the skin with nylon. The subcutaneous tissue was reapproximated with a running 2-0 Vicryl suture and the skin was closed uterine subcuticular 3-0 Vicryl suture.  Dressings were placed over the inguinal incision and attention was turned inferiorly.   A perineal block was administered with Marcaine and lidocaine.  The perineal lesion was appreciated and a margin of  1.5 - 2 cm was obtained circumferentially in an elliptical fashion. The digit placed in the rectum the dissection was continued inferiorly and care taken not to enter the rectum. Hemostasis was assured with the the Bovie cautery or placement of figure-of-eight suture. The deep  dissection was taken to the perineal aponeurosis. The dissection was copiously irrigated and hemostasis was assured. The defect was closed in several layers. Deep interrupted layers of interrupted 2-0 Vicryl. The skin edge and vaginal which were approximated with undyed 3-0 Vicryl suture.   EBL:  Total I/O In: 1000 [I.V.:1000] Out: 250 [Urine:200; Blood:50]  BLOOD ADMINISTERED:none  DRAINS: Penrose drain in the bilateral groins   LOCAL MEDICATIONS USED:  MARCAINE  and LIDOCAINE   SPECIMEN: :  Bilateral inguinal lymph nodes; posterior radical hemipelvectomy, Pap test  DISPOSITION OF SPECIMEN:  PATHOLOGY  COUNTS:  YES  PLAN OF CARE: Admit for overnight observation  PATIENT DISPOSITION:  PACU - hemodynamically stable.

## 2012-12-07 NOTE — H&P (View-Only) (Signed)
Consult Note: Gyn-Onc  Autumn Turner 52 y.o. female  CC:  Chief Complaint  Patient presents with  . Vulvar Cancer    New patient    HPI: Patient is seen today in consultation at the request of Dr. Senaida Ores for newly diagnosed vulvar cancer.  Patient is a 52 year old gravida 2 has been having itching and pain in the vulva for more than 6 months. She states it would flare about every 3-4 months with burning and irritation and then go away. Past summer of 2013 it flared much more significantly and she had significant increase in pain and itching. She looks that the lesion with the mirror she sought white exudate and she took a fungal medication that needed improved. She got that was most likely yeast infection as the antifungal medication helped. She went back to the physician to get a refill on the fungal medication and her daughter encouraged her to have a pelvic exam. At that time the pelvic exam revealed a probable vulvar cancer she was referred to Dr. Senaida Ores.  Dr. 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 Dr. Senaida Ores was not able to do a Pap smear at that time as the patient refused. The patient is a history of abnormal Pap smears and underwent a LEEP in 2010 and had one normal Pap smear in 2012 in his had no followup since that time as she has lost her insurance. She underwent a gastric bypass in 2001 and in 2010 she had complications from her bypass to scarring and could not eat and became no nourished her weight is 85 pounds. She revision of her bypass in May of 2013 with lysis of adhesions and was able to gain back to just under 100 pounds has lost weight again. She states that she has fairly significant nausea she has difficulty keeping her food and without growing up. It sounds report that she has a gastric ulcers and may have developed a gastric outlet obstruction. Today her weight is fairly  stable.  Review of Systems: She denies any vaginal bleeding. She has a bleeding from the ulcer. She has a significant change about bladder habits. She does have gastric cramping and abdominal pain. She has pain from the lesion and she did not use the lidocaine jelly as it cannot really relieve her symptoms. She's never had a mammogram. Her last colonoscopy was in 2013. She does smoke and smokes half a pack to pack per day she's temperature 20 years. 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 She states that due to her malnutrition and weakness she's not able to work a full day.  Current Meds:  Outpatient Encounter Prescriptions as of 11/18/2012  Medication Sig Dispense Refill  . cyanocobalamin (,VITAMIN B-12,) 1000 MCG/ML injection Inject 1 mL (1,000 mcg total) into the muscle once a month.  10 mL  0  . Needles & Syringes MISC 3 cc syringes with 25 gauge  1/2 needle  1 each  0  . fluconazole (DIFLUCAN) 150 MG tablet Take 1 tablet (150 mg total) by mouth once. Repeat if needed  7 tablet  2  . traMADol (ULTRAM) 50 MG tablet Take 1 tablet (50 mg total) by mouth every 8 (eight) hours as needed for pain.  30 tablet  0   No facility-administered encounter medications  on file as of 11/18/2012.    Allergy:  Allergies  Allergen Reactions  . Ciprocinonide (Fluocinolone)   . Iohexol      Code: HIVES   . Nsaids     Due to Hx of gastric bypass    Social Hx:   History   Social History  . Marital Status: Divorced    Spouse Name: N/A    Number of Children: N/A  . Years of Education: N/A   Occupational History  . Not on file.   Social History Main Topics  . Smoking status: Current Every Day Smoker -- 0.50 packs/day for 20 years    Types: Cigarettes  . Smokeless tobacco: Not on file  . Alcohol Use: No  . Drug Use: No  . Sexually Active: Not on file   Other Topics Concern   . Not on file   Social History Narrative  . No narrative on file    Past Surgical Hx:  Past Surgical History  Procedure Laterality Date  . Cesarean section    . Gastric bypass  2001  . Gallbladder surgery  2002  . Tummy tuck  2004  . Leep  06/2009    Past Medical Hx:  Past Medical History  Diagnosis Date  . Hypertension   . Abnormal Pap smear   . Vulvar lesion   . CIN II (cervical intraepithelial neoplasia II)   . Malnourished     Family Hx:  Family History  Problem Relation Age of Onset  . Cancer Father   . Hypertension Father   . Lung cancer Father     Vitals:  Blood pressure 120/74, pulse 86, temperature 98.4 F (36.9 C), resp. rate 20, height 5\' 6"  (1.676 m), weight 99 lb 11.2 oz (45.224 kg).  Physical Exam: Thin ill-appearing female in no acute distress.  Neck: Supple, no lymphadenopathy, no thyromegaly.  Lungs: Clear to auscultation bilaterally.  Cardiac Astro: Regular rate rhythm.  Abdomen: Well-healed surgical incisions and drain sites. Abdomen is soft, nontender, nondistended. There is no palpable masses or hepatosplenomegaly. Evidence of a plastic surgery procedures evident.  Groins: 2 (2 pulse is no lymphadenopathy.  Extremities: No edema.  Pelvic: External genitalia is notable for a 3 x 2 cm ulcerative lesion arising in the left inferior aspect of the labia majora crossing the perineal body at the midline. There is approximate centimeter and a half from the anal verge. Patient is not tolerate a digital vaginal finger or speculum examination. Rectal was performed. Rectal on for this patient does have a rectocele grade 1 and on rectal exam I was able to lift up the vaginal mucosa showed that the vaginal mucosa is not involved.  Assessment/Plan:  52 year old with a stage clinical stage IB invasive squamous cell carcinoma vulva. I believe she is a candidate for a radical vulvectomy and bilateral groin dissection. I spoke to the patient regarding the  surgery and postoperative care that would be required. She understands this lesion does cross midline that she'll require a bilateral vulvectomy's with drains. She is prior experience managing and taking care of drains she's comfortable with this. We also discussed postoperative care. Her surgery is scheduled for April 8. She understands that Dr. Laurette Schimke will be her surgeon and she'll have the opportunity to see her the day of surgery. She will have a Pap smear under anesthesia. Her questions were elicited in answer to her satisfaction. Risks of surgery including bleeding, infection, injury to surrounding organs, vulvar breakdown, lymphedema were discussed with  the patient. She wishes to proceed. She was given a prescription today for narcotics. She'll come in for preop visit. Smoking cessation was encouraged.  Leilene Diprima A., MD 11/18/2012, 3:06 PM

## 2012-12-07 NOTE — Interval H&P Note (Signed)
History and Physical Interval Note:  12/07/2012 11:43 AM  Autumn Turner  has presented today for surgery, with the diagnosis of VULVAR CANCER   The various methods of treatment have been discussed with the patient and family. After consideration of risks, benefits and other options for treatment, the patient has consented to  Procedure(s): VULVECTOMY AND BILATERAL INGUINAL LYMPH NODE DISSECTION (Bilateral) as a surgical intervention .  The patient's history has been reviewed, patient examined, no change in status, stable for surgery.  I have reviewed the patient's chart and labs.  Questions were answered to the patient's satisfaction.     [place], [facility]

## 2012-12-07 NOTE — Transfer of Care (Signed)
Immediate Anesthesia Transfer of Care Note  Patient: Autumn Turner  Procedure(s) Performed: Procedure(s): VULVECTOMY AND BILATERAL INGUINAL LYMPH NODE DISSECTION, PAP SMEAR (Bilateral)  Patient Location: PACU  Anesthesia Type:General  Level of Consciousness: awake, alert , oriented and patient cooperative  Airway & Oxygen Therapy: Patient Spontanous Breathing and Patient connected to face mask oxygen  Post-op Assessment: Report given to PACU RN, Post -op Vital signs reviewed and stable and Patient moving all extremities X 4  Post vital signs: stable  Complications: No apparent anesthesia complications

## 2012-12-07 NOTE — Anesthesia Preprocedure Evaluation (Signed)
Anesthesia Evaluation  Patient identified by MRN, date of birth, ID band Patient awake    Reviewed: Allergy & Precautions, H&P , NPO status , Patient's Chart, lab work & pertinent test results  Airway Mallampati: II TM Distance: >3 FB Neck ROM: Full    Dental no notable dental hx.    Pulmonary neg pulmonary ROS, neg sleep apnea,  breath sounds clear to auscultation  Pulmonary exam normal       Cardiovascular hypertension, Pt. on medications Rhythm:Regular Rate:Normal     Neuro/Psych negative neurological ROS  negative psych ROS   GI/Hepatic negative GI ROS, Neg liver ROS,   Endo/Other  negative endocrine ROS  Renal/GU negative Renal ROS  negative genitourinary   Musculoskeletal negative musculoskeletal ROS (+)   Abdominal   Peds negative pediatric ROS (+)  Hematology negative hematology ROS (+)   Anesthesia Other Findings Chipped upper front teeth  Reproductive/Obstetrics negative OB ROS                           Anesthesia Physical Anesthesia Plan  ASA: III  Anesthesia Plan: General   Post-op Pain Management:    Induction: Intravenous  Airway Management Planned: LMA  Additional Equipment:   Intra-op Plan:   Post-operative Plan: Extubation in OR  Informed Consent: I have reviewed the patients History and Physical, chart, labs and discussed the procedure including the risks, benefits and alternatives for the proposed anesthesia with the patient or authorized representative who has indicated his/her understanding and acceptance.   Dental advisory given  Plan Discussed with: CRNA  Anesthesia Plan Comments:         Anesthesia Quick Evaluation

## 2012-12-07 NOTE — Anesthesia Postprocedure Evaluation (Signed)
  Anesthesia Post-op Note  Patient: Autumn Turner  Procedure(s) Performed: Procedure(s) (LRB): VULVECTOMY AND BILATERAL INGUINAL LYMPH NODE DISSECTION, PAP SMEAR (Bilateral)  Patient Location: PACU  Anesthesia Type: General  Level of Consciousness: awake and alert   Airway and Oxygen Therapy: Patient Spontanous Breathing  Post-op Pain: mild  Post-op Assessment: Post-op Vital signs reviewed, Patient's Cardiovascular Status Stable, Respiratory Function Stable, Patent Airway and No signs of Nausea or vomiting  Last Vitals:  Filed Vitals:   12/07/12 1630  BP: 136/68  Pulse: 66  Temp: 36.7 C  Resp: 14    Post-op Vital Signs: stable   Complications: No apparent anesthesia complications

## 2012-12-08 ENCOUNTER — Encounter: Payer: Self-pay | Admitting: *Deleted

## 2012-12-08 ENCOUNTER — Encounter (HOSPITAL_COMMUNITY): Payer: Self-pay | Admitting: Gynecologic Oncology

## 2012-12-08 LAB — BASIC METABOLIC PANEL
Calcium: 8.3 mg/dL — ABNORMAL LOW (ref 8.4–10.5)
Creatinine, Ser: 0.4 mg/dL — ABNORMAL LOW (ref 0.50–1.10)
GFR calc non Af Amer: >90 mL/min (ref >90)
Glucose, Bld: 117 mg/dL — ABNORMAL HIGH (ref 70–99)
Sodium: 141 mEq/L (ref 135–145)

## 2012-12-08 LAB — CBC
MCH: 18.3 pg — ABNORMAL LOW (ref 26.0–34.0)
Platelets: 315 10*3/uL (ref 150–400)
RBC: 4.16 MIL/uL (ref 3.87–5.11)
RDW: 19.2 % — ABNORMAL HIGH (ref 11.5–15.5)
WBC: 9.6 10*3/uL (ref 4.0–10.5)

## 2012-12-08 MED ORDER — HYDROCODONE-ACETAMINOPHEN 5-325 MG PO TABS: 1.0000 | ORAL_TABLET | Freq: Four times a day (QID) | ORAL | Status: DC | PRN

## 2012-12-08 MED ORDER — HYDROCODONE-ACETAMINOPHEN 10-325 MG PO TABS
1.0000 | ORAL_TABLET | ORAL | Status: DC | PRN
  Administered 2012-12-08: 1 via ORAL
  Filled 2012-12-08: qty 1

## 2012-12-08 MED ORDER — DOCUSATE SODIUM 100 MG PO CAPS
100.0000 mg | ORAL_CAPSULE | Freq: Every day | ORAL | Status: DC
  Administered 2012-12-08: 100 mg via ORAL
  Filled 2012-12-08: qty 1

## 2012-12-08 NOTE — Discharge Summary (Signed)
Physician Discharge Summary  Patient ID: Autumn Turner MRN: [id number] DOB/AGE: Jan 27, 1961 52 y.o.  Admit date: 12/07/2012 Discharge date: 12/08/2012  Admission Diagnoses: Vulvar cancer  Discharge Diagnoses:  Principal Problem:   Vulvar cancer  Discharged Condition:  The patient is in good condition and stable for discharge after lunch.  Hospital Course:  On 12/07/2012, the patient underwent the following: Procedure(s):  VULVECTOMY AND BILATERAL INGUINAL LYMPH NODE DISSECTION, PAP SMEAR.  The postoperative course was uneventful.  She was discharged to home on postoperative day 1 tolerating a regular diet.  Consults: None  Significant Diagnostic Studies: None  Treatments: surgery: see above  Discharge Exam: Blood pressure 134/80, pulse 76, temperature 97.9 F (36.6 C), temperature source Oral, resp. rate 16, height 5\' 6"  (1.676 m), weight 99 lb 11.2 oz (45.224 kg), SpO2 100.00%. General appearance: alert, cooperative and no distress Resp: clear to auscultation bilaterally Cardio: regular rate and rhythm, S1, S2 normal, no murmur, click, rub or gallop GI: soft, non-tender; bowel sounds normal; no masses,  no organomegaly Extremities: extremities normal, atraumatic, no cyanosis or edema Incision/Wound: Bilateral inguinal incisions clean, dry, and intact.  JP x2 charged with minimal amount of serous drainage present.  Minimal amount of vaginal bleeding on peri-pad.  Sutures intact.  Disposition: Home      Discharge Orders   Future Appointments Provider Department Dept Phone   12/16/2012 12:30 PM Laurette Schimke, MD [facility] [phone]   Future Orders Complete By Expires     Call MD for:  difficulty breathing, headache or visual disturbances  As directed     Call MD for:  extreme fatigue  As directed     Call MD for:  hives  As directed     Call MD for:  persistant dizziness or light-headedness  As directed     Call MD for:  persistant nausea  and vomiting  As directed     Call MD for:  redness, tenderness, or signs of infection (pain, swelling, redness, odor or green/yellow discharge around incision site)  As directed     Call MD for:  severe uncontrolled pain  As directed     Call MD for:  temperature >100.4  As directed     Diet - low sodium heart healthy  As directed     Driving Restrictions  As directed     Comments:      No driving for 2 weeks.  Do not take narcotics and drive.    Increase activity slowly  As directed     Lifting restrictions  As directed     Comments:      No lifting greater than 10 lbs.    Sexual Activity Restrictions  As directed     Comments:      No sexual activity, nothing in the vagina, for 8 weeks.        Medication List    TAKE these medications       cyanocobalamin 1000 MCG/ML injection  Commonly known as:  (VITAMIN B-12)  Inject 1,000 mcg into the muscle once a week.     HYDROcodone-acetaminophen 5-325 MG per tablet  Commonly known as:  NORCO/VICODIN  Take 1-2 tablets by mouth every 6 (six) hours as needed for pain.       Follow-up Information   Follow up with Laurette Schimke, MD On 12/16/2012.   Contact information:   [address] [place] [address] [phone]       Signed:  CROSS, MELISSA DEAL 12/08/2012, 10:23 AM

## 2012-12-08 NOTE — Progress Notes (Signed)
Pt for d/c home today with bilateral JP drains. IV d/c'd. Dressing CDI. SS to bil. Inguinal area intact & JP drain x2 intact & patent. D/C instructions & RX given with verbalized understanding. Pt tolerated ambulation on hallway although complained of constant pain to vulvular area. New pain med given to try prior to d/c. Will d/c pt once pain is more manageable.Husband at bedside to assist with d/c.

## 2012-12-08 NOTE — Progress Notes (Signed)
Pt instructed in peri care and JP management.  She has had JP drains in the past and is quite comfortable with the care.

## 2012-12-15 ENCOUNTER — Telehealth: Payer: Self-pay | Admitting: Gynecologic Oncology

## 2012-12-15 NOTE — Telephone Encounter (Signed)
Post op visit. Treatment Planning: Gyn-Onc  Neta Ehlers 52 y.o. female  CC: Vulvar cancer.  Assessment/Plan:  52 year old with stage IB invasive squamous cell carcinoma vulva.S/P radical vulvectomy and bilateral groin dissection December 07, 2012.     HPI: Autumn Turner  is a 52 y.o. gravida 2 has been having itching and pain in the vulva for more than 6 months. She states it would flare about every 3-4 months with burning and irritation and then go away. Past summer of 2013 it flared much more significantly and she had significant increase in pain and itching. She looks that the lesion with the mirror she sought white exudate and she took a fungal medication that needed improved. She got that was most likely yeast infection as the antifungal medication helped. She went back to the physician to get a refill on the fungal medication and her daughter encouraged her to have a pelvic exam. At that time the pelvic exam revealed a probable vulvar cancer she was referred to Dr. Senaida Ores.  Dr. 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 Dr. Senaida Ores was not able to do a Pap smear at that time as the patient refused. The patient is a history of abnormal Pap smears and underwent a LEEP in 2010 and had one normal Pap smear in 2012 in his had no followup since that time as she has lost her insurance. She underwent a gastric bypass in 2001 and in 2010 she had complications from her bypass to scarring and could not eat and became no nourished her weight is 85 pounds. She revision of her bypass in May of 2013 with lysis of adhesions and was able to gain back to just under 100 pounds has lost weight again.   On 12/07/2012 she underwent posterior radical vulvecatomy Bilateral inguinal LND.  Path 1. Lymph nodes, regional resection, left pelvic - EIGHT LYMPH NODES, NEGATIVE FOR METASTATIC CARCINOMA (0/8). 2. Lymph nodes, regional  resection, right pelvic - SIX LYMPH NODES, NEGATIVE FOR METASTATIC CARCINOMA (0/6). 3. Vulva, vulvectomy- INVASIVE MODERATELY DIFFERENTIATED SQUAMOUS CELL CARCINOMA, 2.3 CM - ADJACENT VULVAR INTRAEPITHELIAL NEOPLASIA (VIN-III, WARTY-TYPE). - NO EVIDENCE OF ANGIOLYMPHATIC INVASION OR PERINEURAL INVASION IDENTIFIED. - RESECTION MARGINS, NEGATIVE FOR DYSPLASIA OR MALIGNANCY.  Pap negative.  Social Hx:   History   Social History  . Marital Status: Divorced    Spouse Name: N/A    Number of Children: N/A  . Years of Education: N/A   Occupational History  . Not on file.   Social History Main Topics  . Smoking status: Current Every Day Smoker -- 0.50 packs/day for 20 years    Types: Cigarettes  . Smokeless tobacco: Never Used  . Alcohol Use: No  . Drug Use: No  . Sexually Active: Not on file   Other Topics Concern  . Not on file   Social History Narrative  . No narrative on file    Past Surgical Hx:  Past Surgical History  Procedure Laterality Date  . Cesarean section    . Gastric bypass  2001  . Gallbladder surgery  2002  . Tummy tuck  2004  . Leep  06/2009  . Cholecystectomy    . Tonsillectomy      age 11 adnoids  . Vulvectomy Bilateral 12/07/2012    Procedure: VULVECTOMY AND BILATERAL INGUINAL LYMPH NODE DISSECTION, PAP SMEAR;  Surgeon: Laurette Schimke, MD;  Location: [facility];  Service: Gynecology;  Laterality: Bilateral;    Past Medical Hx:  Past Medical History  Diagnosis Date  . Hypertension   . Abnormal Pap smear   . Vulvar lesion   . CIN II (cervical intraepithelial neoplasia II)   . Malnourished   . Sleep apnea     Loss weight with gastric bypass  . Cancer     vulvar  . Anemia     blood transfusion    Family Hx:  Family History  Problem Relation Age of Onset  . Cancer Father   . Hypertension Father   . Lung cancer Father     Vitals:  Blood pressure 120/74, pulse 86, temperature 98.4 F (36.9 C), resp. rate 20, height 5\' 6"  (1.676 m), weight 99 lb  11.2 oz (45.224 kg).  Physical Exam: Thin ill-appearing female in no acute distress.  Neck: Supple, no lymphadenopathy, no thyromegaly.  Lungs: Clear to auscultation bilaterally.  Groins: 2 (2 pulse is no lymphadenopathy.  Extremities: No edema.  Pelvic: External genitalia is notable for  Laurette Schimke, MD 12/15/2012, 12:17 PM

## 2012-12-16 ENCOUNTER — Encounter: Payer: Self-pay | Admitting: Gynecologic Oncology

## 2012-12-16 ENCOUNTER — Ambulatory Visit: Payer: Self-pay | Attending: Gynecologic Oncology | Admitting: Gynecologic Oncology

## 2012-12-16 VITALS — BP 116/78 | HR 92 | Temp 97.5°F | Resp 18 | Ht 66.0 in | Wt 104.6 lb

## 2012-12-16 DIAGNOSIS — L0390 Cellulitis, unspecified: Secondary | ICD-10-CM

## 2012-12-16 DIAGNOSIS — C519 Malignant neoplasm of vulva, unspecified: Secondary | ICD-10-CM | POA: Insufficient documentation

## 2012-12-16 DIAGNOSIS — T8579XA Infection and inflammatory reaction due to other internal prosthetic devices, implants and grafts, initial encounter: Secondary | ICD-10-CM | POA: Insufficient documentation

## 2012-12-16 DIAGNOSIS — L02219 Cutaneous abscess of trunk, unspecified: Secondary | ICD-10-CM | POA: Insufficient documentation

## 2012-12-16 DIAGNOSIS — L03314 Cellulitis of groin: Secondary | ICD-10-CM

## 2012-12-16 DIAGNOSIS — Y849 Medical procedure, unspecified as the cause of abnormal reaction of the patient, or of later complication, without mention of misadventure at the time of the procedure: Secondary | ICD-10-CM | POA: Insufficient documentation

## 2012-12-16 MED ORDER — LEVOFLOXACIN 250 MG PO TABS: 250.0000 mg | ORAL_TABLET | Freq: Every day | ORAL | Status: DC

## 2012-12-16 MED ORDER — HYDROCODONE-ACETAMINOPHEN 5-325 MG PO TABS: 1.0000 | ORAL_TABLET | Freq: Four times a day (QID) | ORAL | Status: DC | PRN

## 2012-12-16 NOTE — Patient Instructions (Addendum)
Stage IB vulvar cancer.  No additional radiation or chemotherapy is required  Please record the output from the groin drain this so that we can remove them as soon as possible.  Please take Keflex as prescribed for 7 days for treatment of the right groin cellulitis. Please call if there is extension of the brain area on the thigh outside the area marked by pen.  Hydrocodone as been prescribed to help with the discomfort caused by the surgery in the groin and the pelvis.  Followup in one week for reassessment of the surgical sites.

## 2012-12-16 NOTE — Progress Notes (Signed)
Post op visit. Treatment Planning: Gyn-Onc  Autumn Turner 52 y.o. female  CC: Vulvar cancer.  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  On physical examination today there appears to be a cellulitis of the right groin. A 7 day course of liquid Keflex has been prescribed.  I've also provided and extension of the patient's hydrocodone for management of pain. Asked her to followup in one week for reevaluation of the groin incision. She has received instruction again regarding charging of the groin fat take out the period of being very comfortable for the patient if these groin JP drains can be removed as soon as possible.  She's been advised to continue the excellent perineal care  HPI: Autumn Turner  is a 52 y.o. gravida 2 has been having itching and pain in the vulva for more than 6 months. She states it would flare about every 3-4 months with burning and irritation and then go away. Past summer of 2013 it flared much more significantly and she had significant increase in pain and itching. She looks that the lesion with the mirror she sought white exudate and she took a fungal medication that needed improved. She got that was most likely yeast infection as the antifungal medication helped. She went back to the physician to get a refill on the fungal medication and her daughter encouraged her to have a pelvic exam. At that time the pelvic exam revealed a probable vulvar cancer she was referred to Dr. Senaida Ores.  Dr. 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 Dr. Senaida Ores was not able to do a Pap smear at that time as the patient refused. The patient is a history of abnormal Pap smears and underwent a LEEP in 2010 and had one normal Pap smear in 2012 in his had no followup since that time as she has lost  her insurance. She underwent a gastric bypass in 2001 and in 2010 she had complications from her bypass to scarring and could not eat and became no nourished her weight is 85 pounds. She revision of her bypass in May of 2013 with lysis of adhesions and was able to gain back to just under 100 pounds has lost weight again.   On 12/07/2012 she underwent posterior radical vulvecatomy Bilateral inguinal LND.  Path 1. Lymph nodes, regional resection, left pelvic - EIGHT LYMPH NODES, NEGATIVE FOR METASTATIC CARCINOMA (0/8). 2. Lymph nodes, regional resection, right pelvic - SIX LYMPH NODES, NEGATIVE FOR METASTATIC CARCINOMA (0/6). 3. Vulva, vulvectomy- INVASIVE MODERATELY DIFFERENTIATED SQUAMOUS CELL CARCINOMA, 2.3 CM - ADJACENT VULVAR INTRAEPITHELIAL NEOPLASIA (VIN-III, WARTY-TYPE). - NO EVIDENCE OF ANGIOLYMPHATIC INVASION OR PERINEURAL INVASION IDENTIFIED. - RESECTION MARGINS, NEGATIVE FOR DYSPLASIA OR MALIGNANCY.  Pap negative.  Patient reports significant output from bilateral groin drains. She complains of the discomfort of the drains.  She also reports one the of increasing erythema at the site of the right groin.  Patient states that there is still significant pain both at the area of the perineum and bilateral groins.  Social Hx:   History   Social History  . Marital Status: Divorced    Spouse Name: N/A    Number of Children: N/A  . Years of Education: N/A   Occupational History  . Not on file.   Social History Main Topics  . Smoking status: Current Every Day  Smoker -- 0.50 packs/day for 20 years    Types: Cigarettes  . Smokeless tobacco: Never Used  . Alcohol Use: No  . Drug Use: No  . Sexually Active: Not on file   Other Topics Concern  . Not on file   Social History Narrative  . No narrative on file    Past Surgical Hx:  Past Surgical History  Procedure Laterality Date  . Cesarean section    . Gastric bypass  2001  . Gallbladder surgery  2002  . Tummy tuck  2004  .  Leep  06/2009  . Cholecystectomy    . Tonsillectomy      age 34 adnoids  . Vulvectomy Bilateral 12/07/2012    Procedure: VULVECTOMY AND BILATERAL INGUINAL LYMPH NODE DISSECTION, PAP SMEAR;  Surgeon: Laurette Schimke, MD;  Location: [facility];  Service: Gynecology;  Laterality: Bilateral;    Past Medical Hx:  Past Medical History  Diagnosis Date  . Hypertension   . Abnormal Pap smear   . Vulvar lesion   . CIN II (cervical intraepithelial neoplasia II)   . Malnourished   . Sleep apnea     Loss weight with gastric bypass  . Cancer     vulvar  . Anemia     blood transfusion    Family Hx:  Family History  Problem Relation Age of Onset  . Cancer Father   . Hypertension Father   . Lung cancer Father     Vitals:  Blood pressure 116/78, pulse 92, temperature 97.5 F (36.4 C), resp. rate 18, height 5\' 6"  (1.676 m), weight 104 lb 9.6 oz (47.446 kg).  Physical Exam: Thin anxious appearing female in no acute distress.  Neck: Supple, no lymphadenopathy, no thyromegaly.  Lungs: Clear to auscultation bilaterally.  Groins: Drains in place. Cellulitis surrounding the right groin with extension towards the medial thigh  Extremities: No edema.  Pelvic: External genitalia is notable for breakdown of the vulva repair.  No evidence of cellulitis in this area  Laurette Schimke, MD 12/16/2012, 3:16 PM

## 2012-12-21 ENCOUNTER — Encounter: Payer: Self-pay | Admitting: Gynecologic Oncology

## 2012-12-21 ENCOUNTER — Ambulatory Visit: Payer: Self-pay | Attending: Gynecologic Oncology | Admitting: Gynecologic Oncology

## 2012-12-21 VITALS — BP 110/70 | HR 88 | Temp 98.4°F | Resp 20 | Wt 100.9 lb

## 2012-12-21 DIAGNOSIS — C519 Malignant neoplasm of vulva, unspecified: Secondary | ICD-10-CM | POA: Insufficient documentation

## 2012-12-21 DIAGNOSIS — L02219 Cutaneous abscess of trunk, unspecified: Secondary | ICD-10-CM | POA: Insufficient documentation

## 2012-12-21 DIAGNOSIS — I890 Lymphedema, not elsewhere classified: Secondary | ICD-10-CM | POA: Insufficient documentation

## 2012-12-21 DIAGNOSIS — Z9884 Bariatric surgery status: Secondary | ICD-10-CM | POA: Insufficient documentation

## 2012-12-21 NOTE — Patient Instructions (Addendum)
Please decreased tobacco use Please change right inguinal dressings at least once daily preferably twice. Please call the office for fever chills pain redness or concern about the incision To followup in one week

## 2012-12-21 NOTE — Progress Notes (Signed)
Post op visit.  Gyn-Onc  Autumn Turner 52 y.o. female  CC: Vulvar cancer.  Assessment/Plan:  52 y.o.  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  On physical examination April 7 there appears to be a cellulitis of the right groin. A 7 day course of liquid Keflex has been prescribed.   At this visit evidence of a right inguinal incision breakdown was appreciated. Bilateral groin drains were removed and the right inguinal incision debridement.  Patient's been advised to pack the right inguinal incision at least once daily but preferably twice. She's been advised that there will be lymphatic drainage from the incisions. She's been advised to continue intake of vitamin to see and to decrease tobacco use.  A posterior to complete her course of antibiotics and to followup in one week. She's been advised to contact us if there is any worsening in pain fever chills or discomfort at the site of the incision.    HPI: Autumn Turner  is a 52 y.o. gravida 2 has been having itching and pain in the vulva for more than 6 months. She states it would flare about every 3-4 months with burning and irritation and then go away. Past summer of 2013 it flared much more significantly and she had significant increase in pain and itching. She looks that the lesion with the mirror she sought white exudate and she took a fungal medication that needed improved. She got that was most likely yeast infection as the antifungal medication helped. She went back to the physician to get a refill on the fungal medication and her daughter encouraged her to have a pelvic exam. At that time the pelvic exam revealed a probable vulvar cancer she was referred to Dr. Senaida Ores.  Dr. 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 Dr. Senaida Ores was not able to do a Pap smear at  that time as the patient refused. The patient is a history of abnormal Pap smears and underwent a LEEP in 2010 and had one normal Pap smear in 2012 in his had no followup since that time as she has lost her insurance. She underwent a gastric bypass in 2001 and in 2010 she had complications from her bypass to scarring and could not eat and became no nourished her weight is 85 pounds. She revision of her bypass in May of 2013 with lysis of adhesions and was able to gain back to just under 100 pounds has lost weight again.   On 12/07/2012 she underwent posterior radical vulvecatomy Bilateral inguinal LND.  Path 1. Lymph nodes, regional resection, left pelvic - EIGHT LYMPH NODES, NEGATIVE FOR METASTATIC CARCINOMA (0/8). 2. Lymph nodes, regional resection, right pelvic - SIX LYMPH NODES, NEGATIVE FOR METASTATIC CARCINOMA (0/6). 3. Vulva, vulvectomy- INVASIVE MODERATELY DIFFERENTIATED SQUAMOUS CELL CARCINOMA, 2.3 CM - ADJACENT VULVAR INTRAEPITHELIAL NEOPLASIA (VIN-III, WARTY-TYPE). - NO EVIDENCE OF ANGIOLYMPHATIC INVASION OR PERINEURAL INVASION IDENTIFIED. - RESECTION MARGINS, NEGATIVE FOR DYSPLASIA OR MALIGNANCY.  Pap negative.  At the visit on December 16 2012 cellulitis was noted at the site of the right inguinal lymph node dissection. She is currently completing a course of Keflex. She presents with complaints of extrusion of bilateral drains. She reports continued lymphedema and separation of the skin on the right the patient states that the cellulitis is much improved. Denies fevers or chills she denies difficulty in ambulation.  Vitals:  Blood pressure 110/70, pulse 88, temperature 98.4 F (36.9 C), temperature source Oral, resp. rate 20, weight 100 lb 14.4 oz (45.768 kg).  Physical Exam: Thin anxious appearing female in no acute distress.   Groins: Evidence of extrusion of the drains from both groins. The left drain was easily removed. The right groin was noted to have separation of the incision.  Progress tissue was appreciated. The drain was removed and the entire incision debridement. The skin bridge was appreciated at the lateralmost aspect of the incision and this was transected. There was irrigated and packed. The patient and her daughter were instructed to pack the incision at least once but preferably twice daily. The cellulitis appears resolved  Extremities: No edema.  Pelvic: External genitalia is notable for breakdown of the vulva repair.  No evidence of cellulitis in this area  Laurette Schimke, MD 12/21/2012, 2:49 PM

## 2012-12-28 ENCOUNTER — Ambulatory Visit: Payer: Self-pay | Attending: Gynecologic Oncology | Admitting: Gynecologic Oncology

## 2012-12-28 ENCOUNTER — Encounter: Payer: Self-pay | Admitting: Gynecologic Oncology

## 2012-12-28 ENCOUNTER — Ambulatory Visit: Payer: Self-pay | Admitting: Gynecologic Oncology

## 2012-12-28 VITALS — BP 92/70 | HR 98 | Temp 98.0°F | Resp 18 | Ht 66.0 in | Wt 100.9 lb

## 2012-12-28 DIAGNOSIS — C519 Malignant neoplasm of vulva, unspecified: Secondary | ICD-10-CM | POA: Insufficient documentation

## 2012-12-28 NOTE — Progress Notes (Signed)
Post op visit.  Gyn-Onc  Autumn Turner 52 y.o. female  CC: Vulvar cancer.  Assessment/Plan:  52 y.o.  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  The patient was instructed at this visit on how to pack the incision. Have asked her to followup in the clinic with our nurse practitioner next week. I've asked her to followup with me in 2 weeks.   HPI: Autumn Turner  is a 52 y.o. gravida 2 has been having itching and pain in the vulva for more than 6 months. She states it would flare about every 3-4 months with burning and irritation and then go away. Past summer of 2013 it flared much more significantly and she had significant increase in pain and itching. She looks that the lesion with the mirror she sought white exudate and she took a fungal medication that needed improved. She got that was most likely yeast infection as the antifungal medication helped. She went back to the physician to get a refill on the fungal medication and her daughter encouraged her to have a pelvic exam. At that time the pelvic exam revealed a probable vulvar cancer she was referred to Dr. Senaida Turner.  Dr. Senaida Turner saw the patient on February 21 performed a vulvar biopsy that revealed an invasive squamous cell carcinoma of the vulva. Dr. Senaida Turner was not able to do a Pap smear at that time as the patient refused. The patient is a history of abnormal Pap smears and underwent a LEEP in 2010 and had one normal Pap smear in 2012 in his had no followup since that time as she has lost her insurance. She underwent a gastric bypass in 2001 and in 2010 she had complications from her bypass to scarring and could not eat and became no nourished her weight is 85 pounds. She revision of her bypass in May of 2013 with lysis of adhesions and was able to gain back to just under 100 pounds has lost weight again.   On 12/07/2012 she underwent posterior radical  vulvecatomy Bilateral inguinal LND.  Path 1. Lymph nodes, regional resection, left pelvic - EIGHT LYMPH NODES, NEGATIVE FOR METASTATIC CARCINOMA (0/8). 2. Lymph nodes, regional resection, right pelvic - SIX LYMPH NODES, NEGATIVE FOR METASTATIC CARCINOMA (0/6). 3. Vulva, vulvectomy- INVASIVE MODERATELY DIFFERENTIATED SQUAMOUS CELL CARCINOMA, 2.3 CM - ADJACENT VULVAR INTRAEPITHELIAL NEOPLASIA (VIN-III, WARTY-TYPE). - NO EVIDENCE OF ANGIOLYMPHATIC INVASION OR PERINEURAL INVASION IDENTIFIED. - RESECTION MARGINS, NEGATIVE FOR DYSPLASIA OR MALIGNANCY.  Pap negative.  At the visit on December 16 2012 cellulitis was noted at the site of the right inguinal lymph node dissection.  She was treated with Keflex with resolution of the cellulitis. The patient continues to superficially do dressing changes  Vitals:  Blood pressure 92/70, pulse 98, temperature 98 F (36.7 C), resp. rate 18, height 5\' 6"  (1.676 m), weight 100 lb 14.4 oz (45.768 kg).  Physical Exam: Thin anxious appearing female in no acute distress.   Groins: The right groin was noted to have separation of the incision. Excellent granulation tissue is appreciated Extremities: No edema.  Pelvic: External genitalia is notable for breakdown of the vulva repair.  No evidence of cellulitis in this area  Autumn Schimke, MD 12/28/2012, 4:54 PM

## 2012-12-31 ENCOUNTER — Telehealth: Payer: Self-pay | Admitting: *Deleted

## 2012-12-31 NOTE — Telephone Encounter (Signed)
Ret call to pt for c/o tape irritating skin @ right groin incision and it is difficult to keep packing in place.  Pt advised to use skin prep, use peri pad to absorb drainage and pad the area and use underpants to hold in place.  Skin prep, peri pads and mesh panties provided and pt will pick-up later today. Per pt incisional separation is improving, no sx infection.

## 2013-01-04 ENCOUNTER — Ambulatory Visit: Payer: Self-pay | Attending: Gynecology | Admitting: Gynecology

## 2013-01-04 ENCOUNTER — Encounter: Payer: Self-pay | Admitting: Gynecology

## 2013-01-04 VITALS — BP 122/70 | HR 98 | Temp 97.7°F | Resp 16 | Ht 66.0 in | Wt 101.0 lb

## 2013-01-04 DIAGNOSIS — C519 Malignant neoplasm of vulva, unspecified: Secondary | ICD-10-CM | POA: Insufficient documentation

## 2013-01-04 DIAGNOSIS — I898 Other specified noninfective disorders of lymphatic vessels and lymph nodes: Secondary | ICD-10-CM | POA: Insufficient documentation

## 2013-01-04 DIAGNOSIS — N9089 Other specified noninflammatory disorders of vulva and perineum: Secondary | ICD-10-CM | POA: Insufficient documentation

## 2013-01-04 NOTE — Progress Notes (Signed)
Consult Note: Gyn-Onc   Autumn Turner 52 y.o. female  Chief Complaint  Patient presents with  . Vulvar Cancer    Follow up    Assessment : Left inguinal lymphocyst.  Plan: The cyst was drained. The patient keep pressure on the cyst area. If the cyst recurs I would recommend interventional radiology place a new drain to suction. She returned to see Dr. Nelly Rout as previously scheduled.  Interval History: Patient presents today because of a recent onset of a relatively large left inguinal lymphocyst. Previously she has separation of the right inguinal incision. She is managing this with wound dressing changes visit she's doing well. She noted the onset of lymphocyst just days ago. She denies any lymphedema, fever, or cellulitis.  HPI: Patient underwent modified radical vulvectomy and bilateral inguinal lymphadenectomy for a stage IB squamous cell carcinoma of the vulva on 12/07/2012. Final pathology showed negative margins and negative nodes. She has separation of the right inguinal incision which is healing by secondary intention.  Review of Systems:10 point review of systems is negative except as noted in interval history.   Vitals: Blood pressure 122/70, pulse 98, temperature 97.7 F (36.5 C), resp. rate 16, height 5\' 6"  (1.676 m), weight 101 lb (45.813 kg).  Physical Exam: General : The patient is a healthy woman in no acute distress.  HEENT: normocephalic, extraoccular movements normal; neck is supple without thyromegally  Lynphnodes: Supraclavicular and inguinal nodes not enlarged  The right inguinal incision is granulating nicely.  The left inguinal region has a large lymphocyst. Overall the cyst measured approximately 10 x 12 cm. There is no evidence of erythema. Abdomen: Soft, non-tender, no ascites, no organomegally, no masses, no hernias status post abdominoplasty Pelvic:   Deferred    Lower extremities: No edema or varicosities. Normal range of motion no evidence of  lymphedema or cellulitis   Procedure note: A timeout was taken. After cleansing with Betadine, 1% Xylocaine was injected in the center portion of the skin overlying the left inguinal lymphocyst. Using a 14-gauge Angiocath the cyst is drained of 300 cc of straw-colored fluid. A pressure dressing was applied. Patient tolerated procedure well..     Allergies  Allergen Reactions  . Iohexol Hives     Code: HIVES   . Nsaids     Due to Hx of gastric bypass  . Ciprocinonide (Fluocinolone) Hives and Rash    Past Medical History  Diagnosis Date  . Hypertension   . Abnormal Pap smear   . Vulvar lesion   . CIN II (cervical intraepithelial neoplasia II)   . Malnourished   . Sleep apnea     Loss weight with gastric bypass  . Cancer     vulvar  . Anemia     blood transfusion    Past Surgical History  Procedure Laterality Date  . Cesarean section    . Gastric bypass  2001  . Gallbladder surgery  2002  . Tummy tuck  2004  . Leep  06/2009  . Cholecystectomy    . Tonsillectomy      age 57 adnoids  . Vulvectomy Bilateral 12/07/2012    Procedure: VULVECTOMY AND BILATERAL INGUINAL LYMPH NODE DISSECTION, PAP SMEAR;  Surgeon: Laurette Schimke, MD;  Location: [facility];  Service: Gynecology;  Laterality: Bilateral;    Current Outpatient Prescriptions  Medication Sig Dispense Refill  . cyanocobalamin (,VITAMIN B-12,) 1000 MCG/ML injection Inject 1,000 mcg into the muscle once a week.      Marland Kitchen HYDROcodone-acetaminophen (NORCO/VICODIN)  5-325 MG per tablet Take 1-2 tablets by mouth every 6 (six) hours as needed for pain.  60 tablet  0  . levofloxacin (LEVAQUIN) 250 MG tablet Take 1 tablet (250 mg total) by mouth daily.  7 tablet  0   No current facility-administered medications for this visit.    History   Social History  . Marital Status: Divorced    Spouse Name: N/A    Number of Children: N/A  . Years of Education: N/A   Occupational History  . Not on file.   Social History Main Topics   . Smoking status: Current Some Day Smoker -- 0.50 packs/day for 20 years    Types: Cigarettes  . Smokeless tobacco: Never Used  . Alcohol Use: No  . Drug Use: No  . Sexually Active: Not on file   Other Topics Concern  . Not on file   Social History Narrative  . No narrative on file    Family History  Problem Relation Age of Onset  . Cancer Father   . Hypertension Father   . Lung cancer Father       Jeannette Corpus, MD 01/04/2013, 1:51 PM

## 2013-01-04 NOTE — Patient Instructions (Signed)
Return to see Dr. Nelly Rout as previously scheduled. Continue to keep pressure on the left groin area.

## 2013-01-06 ENCOUNTER — Ambulatory Visit: Payer: Self-pay | Admitting: Gynecologic Oncology

## 2013-01-13 ENCOUNTER — Ambulatory Visit: Payer: Self-pay | Attending: Gynecologic Oncology | Admitting: Gynecologic Oncology

## 2013-01-13 ENCOUNTER — Encounter: Payer: Self-pay | Admitting: Gynecologic Oncology

## 2013-01-13 VITALS — BP 122/70 | HR 60 | Temp 98.1°F | Resp 16 | Ht 66.0 in | Wt 105.4 lb

## 2013-01-13 DIAGNOSIS — L02419 Cutaneous abscess of limb, unspecified: Secondary | ICD-10-CM | POA: Insufficient documentation

## 2013-01-13 DIAGNOSIS — C519 Malignant neoplasm of vulva, unspecified: Secondary | ICD-10-CM | POA: Insufficient documentation

## 2013-01-13 DIAGNOSIS — T8131XA Disruption of external operation (surgical) wound, not elsewhere classified, initial encounter: Secondary | ICD-10-CM | POA: Insufficient documentation

## 2013-01-13 DIAGNOSIS — R609 Edema, unspecified: Secondary | ICD-10-CM | POA: Insufficient documentation

## 2013-01-13 DIAGNOSIS — Z9884 Bariatric surgery status: Secondary | ICD-10-CM | POA: Insufficient documentation

## 2013-01-13 DIAGNOSIS — N9489 Other specified conditions associated with female genital organs and menstrual cycle: Secondary | ICD-10-CM | POA: Insufficient documentation

## 2013-01-13 DIAGNOSIS — L03119 Cellulitis of unspecified part of limb: Secondary | ICD-10-CM | POA: Insufficient documentation

## 2013-01-13 DIAGNOSIS — I898 Other specified noninfective disorders of lymphatic vessels and lymph nodes: Secondary | ICD-10-CM

## 2013-01-13 DIAGNOSIS — Y838 Other surgical procedures as the cause of abnormal reaction of the patient, or of later complication, without mention of misadventure at the time of the procedure: Secondary | ICD-10-CM | POA: Insufficient documentation

## 2013-01-13 NOTE — Patient Instructions (Addendum)
Compress right groin for 1 week Follow-up in 1 week Will replace groin drain if there is reaccumulation of lymphocele Will schedule for lymphedema clinic Keflex 500 mg twice daily for 7 days for right lower extremity cellulitis

## 2013-01-13 NOTE — Progress Notes (Signed)
Post op visit.  Gyn-Onc  Autumn Turner 52 y.o. female  CC: Vulvar cancer.  Assessment/Plan:  52 y.o.  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 Patient with recurring right inguinal lymphocyst.  It has been drained 01/04/2013 and 01/13/2013.  Patient is reluctant to have another drain placed because of concerns for drain erosion. She has consented to activity apply pressure to the groin over the next few week with a plan for drain placement by interventional radiology as scheduled.  RLE cellulitis Rx oral Keflex 500mg  tid x 7 weeks  B lymhedema: Referral to lymphedema clinic   HPI: Autumn Turner  is a 52 y.o. gravida 2 has been having itching and pain in the vulva for more than 6 months. She states it would flare about every 3-4 months with burning and irritation and then go away. Past summer of 2013 it flared much more significantly and she had significant increase in pain and itching. She looks that the lesion with the mirror she sought white exudate and she took a fungal medication that needed improved. She got that was most likely yeast infection as the antifungal medication helped. She went back to the physician to get a refill on the fungal medication and her daughter encouraged her to have a pelvic exam. At that time the pelvic exam revealed a probable vulvar cancer she was referred to Dr. Senaida Ores.  Dr. 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 Dr. Senaida Ores was not able to do a Pap smear at that time as the patient refused. The patient is a history of abnormal Pap smears and underwent a LEEP in 2010 and had one normal Pap smear in 2012 in his had no followup since that time as she has lost her insurance. She underwent a gastric bypass in 2001 and in 2010 she had complications from her bypass to scarring and could not  eat and became no nourished her weight is 85 pounds. She revision of her bypass in May of 2013 with lysis of adhesions and was able to gain back to just under 100 pounds has lost weight again.   On 12/07/2012 she underwent posterior radical vulvecatomy Bilateral inguinal LND.  Path 1. Lymph nodes, regional resection, left pelvic - EIGHT LYMPH NODES, NEGATIVE FOR METASTATIC CARCINOMA (0/8). 2. Lymph nodes, regional resection, right pelvic - SIX LYMPH NODES, NEGATIVE FOR METASTATIC CARCINOMA (0/6). 3. Vulva, vulvectomy- INVASIVE MODERATELY DIFFERENTIATED SQUAMOUS CELL CARCINOMA, 2.3 CM - ADJACENT VULVAR INTRAEPITHELIAL NEOPLASIA (VIN-III, WARTY-TYPE). - NO EVIDENCE OF ANGIOLYMPHATIC INVASION OR PERINEURAL INVASION IDENTIFIED. - RESECTION MARGINS, NEGATIVE FOR DYSPLASIA OR MALIGNANCY.  Pap negative.  At the visit on December 16 2012 cellulitis was noted at the site of the right inguinal lymph node dissection.  She was treated with Keflex with resolution of the cellulitis. The patient presented with left groin lymphocyst 01/04/2013 300 cc removed.  Patient reports re-accumulation of left inguinal cyst fluid  Vitals:  Blood pressure 122/70, pulse 60, temperature 98.1 F (36.7 C), temperature source Oral, resp. rate 16, height 5\' 6"  (1.676 m), weight 105 lb 6.4 oz (47.809 kg).  Physical Exam: Thin anxious appearing female in no acute distress.  Groins: The right groin was noted to have separation of the incision. Excellent granulation tissue is appreciated.  Left groin incision with lymhocyst.  380 cc clear fluid removed under sterile conditions.  Pressure  dressing placed. Pelvic: External genitalia is healing well.   No evidence of cellulitis in this area Extremities: 2+  Edema BLE with excoriations and cellulitis of the left leg.   Laurette Schimke, MD 01/13/2013, 3:11 PM

## 2013-01-18 ENCOUNTER — Ambulatory Visit: Payer: Self-pay | Attending: Gynecologic Oncology | Admitting: Gynecologic Oncology

## 2013-01-18 ENCOUNTER — Encounter: Payer: Self-pay | Admitting: Gynecologic Oncology

## 2013-01-18 ENCOUNTER — Encounter (HOSPITAL_COMMUNITY): Payer: Self-pay | Admitting: Pharmacy Technician

## 2013-01-18 VITALS — BP 142/88 | HR 80 | Temp 98.3°F | Resp 18 | Ht 66.0 in | Wt 103.5 lb

## 2013-01-18 DIAGNOSIS — I890 Lymphedema, not elsewhere classified: Secondary | ICD-10-CM | POA: Insufficient documentation

## 2013-01-18 DIAGNOSIS — L02419 Cutaneous abscess of limb, unspecified: Secondary | ICD-10-CM | POA: Insufficient documentation

## 2013-01-18 DIAGNOSIS — C519 Malignant neoplasm of vulva, unspecified: Secondary | ICD-10-CM | POA: Insufficient documentation

## 2013-01-18 DIAGNOSIS — T8131XA Disruption of external operation (surgical) wound, not elsewhere classified, initial encounter: Secondary | ICD-10-CM | POA: Insufficient documentation

## 2013-01-18 DIAGNOSIS — Y838 Other surgical procedures as the cause of abnormal reaction of the patient, or of later complication, without mention of misadventure at the time of the procedure: Secondary | ICD-10-CM | POA: Insufficient documentation

## 2013-01-18 DIAGNOSIS — Z9884 Bariatric surgery status: Secondary | ICD-10-CM | POA: Insufficient documentation

## 2013-01-18 NOTE — Patient Instructions (Signed)
Lymphedema Lymphedema is a swelling caused by the abnormal collection of lymph under the skin. The lymph is fluid from the tissues in your body that travels in the lymphatic system. This system is part of the immune system that includes lymph nodes and vessels. The lymph vessels collect and carry the excess fluid, fats, proteins, and wastes from the tissues of the body to the bloodstream. This system also works to clean and remove bacteria and waste products from the body.  Lymphedema occurs when the lymphatic system is blocked. When the lymph vessels or lymph nodes are blocked or damaged, lymph does not drain properly. This causes abnormal build up of lymph. This leads to swelling in the arms or legs. Lymphedema cannot be cured by medicines. But the swelling can be reduced by physical methods. CAUSES  There are two types of Lymphedema. Primary lymphedema is caused by the absence or abnormality of the lymph vessel at birth. It is also known as inherited lymphedema, which occurs rarely. Secondary or acquired lymphedema occurs when the lymph vessel is damaged or blocked. The causes of lymph vessel blockage are:   Skin infection like cellulites.  Infection by parasites (filariasis).  Injury.  Cancer.  Radiation therapy.  Formation of scar tissue.  Surgery. SYMPTOMS  The symptoms of lymphedema are:  Abnormal swelling of the arm or leg.  Heavy or tight feeling in your arm or leg.  Tight-fitting shoes or rings.  Redness of skin over the affected area.  Limited movement of the affected limb.  Some patients complain about sensitivity to touch and discomfort in the limb(s) affected. You may not have these symptoms immediately following injury. They usually appear within a few days or even years after injury. Inform your caregiver, if you have any of these symptoms. Early treatment can avoid further problems.  DIAGNOSIS  First, your caregiver will inquire about any surgery you have had or  medicines you are taking. He will then examine you. Your caregiver may order special imaging tests, such as:  Lymphoscintigraphy (a test in which a low dose of radioactive substance is injected to trace the flow of lymph through the lymph vessels).  MRI (imaging tests using sound waves).  Computed tomography (test using special cross-sectional X-rays).  Duplex ultrasound (test using high-frequency sound waves to show the vessels and the blood flow on a screen).  Lymphangiography (special X-ray taken after injecting a contrast dye into the lymph vessel). It is now rarely done. TREATMENT  Lymphedema can be treated in different ways. Your caregiver will decide the type of treatment depending on the cause. Treatment may include:  Exercise: Special exercises will help fluid move out easily from the affected part. This should be done as per your caregiver's advice.  Manual lymph drainage: Gentle massage of the affected limb makes the fluid to move out more freely.  Compression: Compression stockings or external pump apply pressure over the affected limb. This helps the fluid to move out from the arm or leg. Bandaging can also help to move the fluid out from the affected part. Your caregiver will decide the method that suits you the most.   Medicines: Your caregiver may prescribe antibiotics, if you have infection.  Surgery: Your caregiver may advise surgery for severe lymphedema. It is reserved for special cases when the patient has difficulty moving. Your surgeon may remove excess tissue from the arm or leg. This will help to ease your movement. Physical therapy may have to be continued after surgery. HOME CARE  INSTRUCTIONS   Eat a healthy diet.  Exercise regularly as per advice.  Keep the affected area clean and dry.  Use gloves while cooking or gardening.  Protect your skin from cuts.  Use electric razor to shave the affected area.  Keep affected limb elevated.  Do not wear tight  clothes, shoes, or jewels as it may cause the tissue to be strangled.  Do not use heat pads over the affected area.  Do not sit with cross legs.  Do not walk barefoot.  Do not carry weight on the affected arm.  Avoid having blood pressure checked on the affected limb.  The area is very fragile and is predisposed to injury and infection. SEEK MEDICAL CARE IF:  You continue to have swelling in your limb. SEEK IMMEDIATE MEDICAL CARE IF:   You have high fever.  You have skin rash.  You have chills or sweats.  You have pain or redness.  You have a cut that does not heal. MAKE SURE YOU:   Understand these instructions.  Will watch your condition.  Will get help right away if you are not doing well or get worse. Document Released: 06/15/2007 Document Revised: 11/10/2011 Document Reviewed: 05/21/2009 [facility] Patient Information 2013 [place], [place].

## 2013-01-18 NOTE — Progress Notes (Signed)
Post op visit.  Gyn-Onc  Autumn Turner 52 y.o. female  CC: Vulvar cancer.  Assessment/Plan:  52 y.o.  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 Patient with recurring right inguinal lymphocyst.  It has been drained 01/04/2013 and 01/13/2013.  Patient is reluctant to have another drain placed because of concerns for drain erosion.  She has consented to activity apply pressure to the groin over the next few week with a plan for drain placement by interventional radiology as scheduled on Tuesday 01/25/2013.  RLE cellulitis Rx oral Keflex 500mg  tid x 7 weeks  B lymhedema: Referral to lymphedema clinic   HPI: Autumn Turner  is a 52 y.o. gravida 2 has been having itching and pain in the vulva for more than 6 months. She states it would flare about every 3-4 months with burning and irritation and then go away. Past summer of 2013 it flared much more significantly and she had significant increase in pain and itching. She looks that the lesion with the mirror she sought white exudate and she took a fungal medication that needed improved. She got that was most likely yeast infection as the antifungal medication helped. She went back to the physician to get a refill on the fungal medication and her daughter encouraged her to have a pelvic exam. At that time the pelvic exam revealed a probable vulvar cancer she was referred to Dr. Senaida Ores.  Dr. 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 Dr. Senaida Ores was not able to do a Pap smear at that time as the patient refused. The patient is a history of abnormal Pap smears and underwent a LEEP in 2010 and had one normal Pap smear in 2012 in his had no followup since that time as she has lost her insurance. She underwent a gastric bypass in 2001 and in 2010 she had complications from her bypass to  scarring and could not eat and became no nourished her weight is 85 pounds. She revision of her bypass in May of 2013 with lysis of adhesions and was able to gain back to just under 100 pounds has lost weight again.   On 12/07/2012 she underwent posterior radical vulvecatomy Bilateral inguinal LND.  Path 1. Lymph nodes, regional resection, left pelvic - EIGHT LYMPH NODES, NEGATIVE FOR METASTATIC CARCINOMA (0/8). 2. Lymph nodes, regional resection, right pelvic - SIX LYMPH NODES, NEGATIVE FOR METASTATIC CARCINOMA (0/6). 3. Vulva, vulvectomy- INVASIVE MODERATELY DIFFERENTIATED SQUAMOUS CELL CARCINOMA, 2.3 CM - ADJACENT VULVAR INTRAEPITHELIAL NEOPLASIA (VIN-III, WARTY-TYPE). - NO EVIDENCE OF ANGIOLYMPHATIC INVASION OR PERINEURAL INVASION IDENTIFIED. - RESECTION MARGINS, NEGATIVE FOR DYSPLASIA OR MALIGNANCY.  Pap negative.  At the visit on December 16 2012 cellulitis was noted at the site of the right inguinal lymph node dissection.  She was treated with Keflex with resolution of the cellulitis. The patient presented with left groin lymphocyst 01/04/2013 300 cc removed and again last week 380 cc clear fluid removed.  Patient reports re-accumulation of left inguinal cyst fluid  Vitals:  Blood pressure 142/88, pulse 80, temperature 98.3 F (36.8 C), resp. rate 18, height 5\' 6"  (1.676 m), weight 103 lb 8 oz (46.947 kg).  Physical Exam: Thin anxious appearing female in no acute distress.  Groins: The right groin was noted to have separation of the incision. Excellent granulation tissue is appreciated.  Left groin incision with lymhocyst.  Verbal consent was obtained.  The area was cleaned with betadine and 1 cc of 1% plain lidocaine was injected, using an 18 gauge angiocath, the lymphcyst was entered. 300 cc clear fluid removed under sterile conditions.  Pressure dressing placed.   Urvi Imes A., MD 01/18/2013, 3:50 PM

## 2013-01-19 ENCOUNTER — Telehealth: Payer: Self-pay

## 2013-01-19 MED ORDER — CYANOCOBALAMIN 1000 MCG/ML IJ SOLN: 1000.0000 ug | INTRAMUSCULAR | Status: DC

## 2013-01-19 NOTE — Telephone Encounter (Signed)
Pended please advise. B12 injections 1 mg every week x4, then every month, pended for once monthly

## 2013-01-19 NOTE — Telephone Encounter (Signed)
Pt was wanting to make sure when B12 injection was sent to pharmacy that the syringes and needles would be included because she said sometimes they aren't Call back number is [phone] Pharmacy is [facility] on [address]

## 2013-01-19 NOTE — Telephone Encounter (Signed)
Sent to pharmacy 

## 2013-01-20 ENCOUNTER — Encounter: Payer: Self-pay | Admitting: Gynecologic Oncology

## 2013-01-20 ENCOUNTER — Ambulatory Visit: Payer: Self-pay | Attending: Gynecologic Oncology | Admitting: Gynecologic Oncology

## 2013-01-20 ENCOUNTER — Other Ambulatory Visit: Payer: Self-pay | Admitting: Radiology

## 2013-01-20 ENCOUNTER — Ambulatory Visit: Payer: Self-pay

## 2013-01-20 VITALS — BP 92/60 | HR 70 | Temp 97.5°F | Resp 14 | Ht 66.0 in | Wt 101.6 lb

## 2013-01-20 DIAGNOSIS — R609 Edema, unspecified: Secondary | ICD-10-CM | POA: Insufficient documentation

## 2013-01-20 DIAGNOSIS — I890 Lymphedema, not elsewhere classified: Secondary | ICD-10-CM | POA: Insufficient documentation

## 2013-01-20 DIAGNOSIS — C519 Malignant neoplasm of vulva, unspecified: Secondary | ICD-10-CM | POA: Insufficient documentation

## 2013-01-20 MED ORDER — HYDROCODONE-ACETAMINOPHEN 5-325 MG PO TABS: 1.0000 | ORAL_TABLET | Freq: Four times a day (QID) | ORAL | Status: DC | PRN

## 2013-01-20 NOTE — Progress Notes (Signed)
Checked in patient with appt with Toniann Fail B. She has no insurance. She has applied for disability and I will give her an application for medicaid also.I also gave her an EPP and it is only her hubby that is working. She is aware of possible assistance if she is approved for assistance.

## 2013-01-20 NOTE — Telephone Encounter (Signed)
Patient notified and voiced understanding.

## 2013-01-20 NOTE — Progress Notes (Signed)
Post op visit.  Gyn-Onc  Autumn Turner 52 y.o. female  CC: Vulvar cancer.  Assessment/Plan:  52 y.o.  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 Patient with recurring right inguinal lymphocyst.  It has been drained 01/04/2013 and 01/13/2013, 5/20 and today .  Patient is reluctant to have another drain placed because of concerns for drain erosion. Followup for drain placement by interventional radiology as scheduled.  RLE cellulitis Rx oral Keflex 500mg  tid x 7 days completed B lymhedema: Referral to lymphedema clinic   HPI: Autumn Turner  is a 52 y.o. gravida 2 has been having itching and pain in the vulva for more than 6 months. She states it would flare about every 3-4 months with burning and irritation and then go away. Past summer of 2013 it flared much more significantly and she had significant increase in pain and itching. She looks that the lesion with the mirror she sought white exudate and she took a fungal medication that needed improved. She got that was most likely yeast infection as the antifungal medication helped. She went back to the physician to get a refill on the fungal medication and her daughter encouraged her to have a pelvic exam. At that time the pelvic exam revealed a probable vulvar cancer she was referred to Dr. Senaida Ores.  Dr. 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 Dr. Senaida Ores was not able to do a Pap smear at that time as the patient refused. The patient is a history of abnormal Pap smears and underwent a LEEP in 2010 and had one normal Pap smear in 2012 in his had no followup since that time as she has lost her insurance. She underwent a gastric bypass in 2001 and in 2010 she had complications from her bypass to scarring and could not eat and became no nourished her weight is 85 pounds. She  revision of her bypass in May of 2013 with lysis of adhesions and was able to gain back to just under 100 pounds has lost weight again.   On 12/07/2012 she underwent posterior radical vulvecatomy Bilateral inguinal LND.  Path 1. Lymph nodes, regional resection, left pelvic - EIGHT LYMPH NODES, NEGATIVE FOR METASTATIC CARCINOMA (0/8). 2. Lymph nodes, regional resection, right pelvic - SIX LYMPH NODES, NEGATIVE FOR METASTATIC CARCINOMA (0/6). 3. Vulva, vulvectomy- INVASIVE MODERATELY DIFFERENTIATED SQUAMOUS CELL CARCINOMA, 2.3 CM - ADJACENT VULVAR INTRAEPITHELIAL NEOPLASIA (VIN-III, WARTY-TYPE). - NO EVIDENCE OF ANGIOLYMPHATIC INVASION OR PERINEURAL INVASION IDENTIFIED. - RESECTION MARGINS, NEGATIVE FOR DYSPLASIA OR MALIGNANCY.  Pap negative.  At the visit on December 16 2012 cellulitis was noted at the site of the right inguinal lymph node dissection.  She was treated with Keflex with resolution of the cellulitis. The patient presented with left groin lymphocyst.  We are awaiting drain placement by interventional radiology next week.  Patient reports re-accumulation of left inguinal cyst fluid interpreted on 01/20/2013. She wishes to drain so that her clothes will fit well with her brothers [facility] planned for Sunday.  Vitals:  Blood pressure 92/60, pulse 70, temperature 97.5 F (36.4 C), resp. rate 14, height 5\' 6"  (1.676 m), weight 101 lb 9.6 oz (46.085 kg).  Physical Exam: Thin anxious appearing female in no acute distress.  Groins: The right groin was noted to have separation of the incision.  Left groin incision with lymphocyst.  300 cc clear  fluid removed under sterile conditions.  Pressure dressing placed. Pelvic: External genitalia is healing well.   Extremities: 1-2+  Edema BLE with excoriations and cellulitis of the left leg.   Laurette Schimke, MD 01/20/2013, 4:51 PM

## 2013-01-21 ENCOUNTER — Encounter (HOSPITAL_COMMUNITY): Payer: Self-pay | Admitting: Pharmacy Technician

## 2013-01-21 ENCOUNTER — Other Ambulatory Visit: Payer: Self-pay | Admitting: Radiology

## 2013-01-25 ENCOUNTER — Other Ambulatory Visit: Payer: Self-pay | Admitting: Gynecologic Oncology

## 2013-01-25 ENCOUNTER — Ambulatory Visit (HOSPITAL_COMMUNITY): Admission: RE | Admit: 2013-01-25 | Payer: Self-pay | Source: Ambulatory Visit

## 2013-01-25 ENCOUNTER — Ambulatory Visit (HOSPITAL_COMMUNITY)
Admission: RE | Admit: 2013-01-25 | Discharge: 2013-01-25 | Disposition: A | Discharge status: Home / Self Care | Payer: Self-pay | Source: Ambulatory Visit | Attending: Gynecologic Oncology | Admitting: Gynecologic Oncology

## 2013-01-25 DIAGNOSIS — Z9889 Other specified postprocedural states: Secondary | ICD-10-CM | POA: Insufficient documentation

## 2013-01-25 DIAGNOSIS — I898 Other specified noninfective disorders of lymphatic vessels and lymph nodes: Secondary | ICD-10-CM | POA: Insufficient documentation

## 2013-01-25 LAB — CBC
Hemoglobin: 8.4 g/dL — ABNORMAL LOW (ref 12.0–15.0)
MCH: 18.3 pg — ABNORMAL LOW (ref 26.0–34.0)
MCV: 63.3 fL — ABNORMAL LOW (ref 78.0–100.0)
RBC: 4.58 MIL/uL (ref 3.87–5.11)

## 2013-01-25 MED ORDER — MIDAZOLAM HCL 2 MG/2ML IJ SOLN
INTRAMUSCULAR | Status: AC | PRN
  Administered 2013-01-25: 0.5 mg via INTRAVENOUS
  Administered 2013-01-25 (×2): 2 mg via INTRAVENOUS

## 2013-01-25 MED ORDER — SODIUM CHLORIDE 0.9 % IV SOLN: INTRAVENOUS | Status: DC

## 2013-01-25 MED ORDER — FENTANYL CITRATE 0.05 MG/ML IJ SOLN
INTRAMUSCULAR | Status: AC
  Filled 2013-01-25: qty 4

## 2013-01-25 MED ORDER — FENTANYL CITRATE 0.05 MG/ML IJ SOLN
INTRAMUSCULAR | Status: AC | PRN
  Administered 2013-01-25 (×2): 50 ug via INTRAVENOUS

## 2013-01-25 MED ORDER — MIDAZOLAM HCL 2 MG/2ML IJ SOLN
INTRAMUSCULAR | Status: AC
  Filled 2013-01-25: qty 6

## 2013-01-25 NOTE — H&P (Signed)
Autumn Turner is an 52 y.o. female.   Chief Complaint: "I'm having a drain put in my left groin cyst" HPI: Patient with history of vulvar carcinoma, s/p radical vulvectomy/bilateral groin dissection 11/2012, and recurrent inguinal lymphocysts presents today for aspiration/possible drainage of the left inguinal lymphocyst.  Past Medical History  Diagnosis Date  . Hypertension   . Abnormal Pap smear   . Vulvar lesion   . CIN II (cervical intraepithelial neoplasia II)   . Malnourished   . Sleep apnea     Loss weight with gastric bypass  . Cancer     vulvar  . Anemia     blood transfusion    Past Surgical History  Procedure Laterality Date  . Cesarean section    . Gastric bypass  2001  . Gallbladder surgery  2002  . Tummy tuck  2004  . Leep  06/2009  . Cholecystectomy    . Tonsillectomy      age 32 adnoids  . Vulvectomy Bilateral 12/07/2012    Procedure: VULVECTOMY AND BILATERAL INGUINAL LYMPH NODE DISSECTION, PAP SMEAR;  Surgeon: Laurette Schimke, MD;  Location: [facility];  Service: Gynecology;  Laterality: Bilateral;    Family History  Problem Relation Age of Onset  . Cancer Father   . Hypertension Father   . Lung cancer Father    Social History:  reports that she has been smoking Cigarettes.  She has a 10 pack-year smoking history. She has never used smokeless tobacco. She reports that she does not drink alcohol or use illicit drugs.  Allergies:  Allergies  Allergen Reactions  . Iohexol Hives     Code: HIVES   . Nsaids     Due to Hx of gastric bypass  . Ciprocinonide (Fluocinolone) Hives and Rash    Results for orders placed during the hospital encounter of 12/07/12  BASIC METABOLIC PANEL      Result Value Range   Sodium 141  135 - 145 mEq/L   Potassium 3.8  3.5 - 5.1 mEq/L   Chloride 107  96 - 112 mEq/L   CO2 29  19 - 32 mEq/L   Glucose, Bld 117 (*) 70 - 99 mg/dL   BUN 8  6 - 23 mg/dL   Creatinine, Ser 1.61 (*) 0.50 - 1.10 mg/dL   Calcium 8.3 (*) 8.4 - 10.5 mg/dL    GFR calc non Af Amer >90  >90 mL/min   GFR calc Af Amer >90  >90 mL/min  CBC      Result Value Range   WBC 9.6  4.0 - 10.5 K/uL   RBC 4.16  3.87 - 5.11 MIL/uL   Hemoglobin 7.6 (*) 12.0 - 15.0 g/dL   HCT 09.6 (*) 04.5 - 40.9 %   MCV 65.1 (*) 78.0 - 100.0 fL   MCH 18.3 (*) 26.0 - 34.0 pg   MCHC 28.0 (*) 30.0 - 36.0 g/dL   RDW 81.1 (*) 91.4 - 78.2 %   Platelets 315  150 - 400 K/uL    No results found for this or any previous visit (from the past 48 hour(s)). No results found.  Review of Systems  Constitutional: Positive for weight loss. Negative for fever and chills.  Respiratory: Negative for cough and shortness of breath.   Cardiovascular: Negative for chest pain.  Gastrointestinal: Negative for nausea and vomiting.       Left pelvic discomfort  Musculoskeletal: Positive for back pain.  Neurological: Negative for headaches.  Psychiatric/Behavioral: The patient is  nervous/anxious.     Blood pressure 124/86, pulse 77, temperature 98.6 F (37 C), temperature source Oral, resp. rate 18, height 5\' 6"  (1.676 m), weight 100 lb (45.36 kg), SpO2 99.00%. Physical Exam  Constitutional: She is oriented to person, place, and time.  Thin WF in NAD, appears older than stated age  Cardiovascular: Normal rate and regular rhythm.   Respiratory: Effort normal.  Distant BS bilat  GI: Soft. Bowel sounds are normal. There is no tenderness.  moderately large left inguinal lymphocyst , mildly tender to palpation  Musculoskeletal: Normal range of motion. She exhibits edema.  Neurological: She is alert and oriented to person, place, and time.     Assessment/Plan Pt with hx vulvar carcinoma , s/p radical vulvectomy with bilateral groin LN dissection 11/2012 ; now with recurrent left inguinal lymphocyst. Plan is for US guided drainage of lymphocyst today. Details/risks of above d/w pt/husband with their understanding and consent.  ALLRED,D KEVIN 01/25/2013, 1:11 PM

## 2013-01-25 NOTE — Discharge Instructions (Signed)
Bulb Drain Home Care A bulb drain consists of a thin rubber tube and a soft, round bulb that creates a gentle suction. The rubber tube is placed in the area where you had surgery. A bulb is attached to the end of the tube that is outside the body. The bulb drain removes excess fluid that normally builds up in a surgical wound after surgery. The color and amount of fluid will vary. Immediately after surgery, the fluid is bright red and is a little thicker than water. It may gradually change to a yellow or pink color and become more thin and water-like. When the amount decreases to about 1 or 2 tbs in 24 hours, your caregiver will usually remove it. DAILY CARE  Keep the bulb flat (compressed) at all times, except while emptying it. The flatness creates suction. You can flatten the bulb by squeezing it firmly in the middle and then closing the cap.  Keep sites where the tube enters the skin dry and covered with a bandage (dressing).  Tape the tube to your skin, 1 2 inches (2.5 5 cm) below the insertion sites, to keep it from pulling on your stitches. The tube is stitched in place and will not slip out.  Pin the bulb to your shirt (not to your pants) with a safety pin.  For the first 3 days after surgery, there usually is more fluid in the bulb. Empty the bulb whenever it becomes half full because the bulb does not create enough suction if it is too full. The bulb could also overflow. Write down how much fluid you remove each time you empty your drain. Add up the amount removed in 24 hours.  Empty the bulb at the same time every day once the amount of fluid decreases and you only need to empty it once a day. Write down the amounts and the 24 hour totals to give to your caregiver. This helps your caregiver know when the tubes can be removed. EMPTYING THE BULB DRAIN Before emptying the bulb, get a measuring cup, a piece of paper, and a pen.  Open the bulb cap to release suction, which causes it to  inflate. Do not touch the inside of the cap.  Gently run your fingers down the tube (stripping) to empty any drainage from the tubing into the bulb.  Hold the cap out of the way, and pour fluid into the measuring cup.   Squeeze the bulb to provide suction.  Replace the cap.   Check the tape that holds the tube to your skin. If it is becoming loose, you can remove the loose piece of tape and apply a new one. Then, pin the bulb to your shirt.   Write down the amount of fluid you emptied out. Write down the date and each time you emptied your bulb drain. (If there are 2 bulbs, note the amount of drainage from each bulb and keep the totals separate. Your caregiver will want to know the total amounts for each drain and which tube is draining more.)   Flush the fluid down the toilet and wash your hands.   Call your caregiver once you have less than 2 tbs of fluid collecting in the bulb drain every 24 hours. If you see a clot in the tube, leave it alone. However, if the tube does not appear to be draining, let your caregiver know. If there is drainage around the tube site, change dressings and keep the area dry. Cleanse around  tube with sterile saline and place dry gauze around site. This gauze should be changed when it is soiled. If it stays clean and unsoiled, it should still be changed every 2 3 days.  SEEK MEDICAL CARE IF:  Your drainage has a bad smell or is cloudy.   You have a fever.   Your drainage is increasing instead of decreasing.   Your tube fell out.   You have redness or swelling around the tube site.   You have drainage from a surgical wound.   Your bulb drain will not stay flat after you empty it.  MAKE SURE YOU:   Understand these instructions.  Will watch your condition.  Will get help right away if you are not doing well or get worse. Document Released: 08/15/2000 Document Revised: 08/04/2012 Document Reviewed: 01/21/2012 [facility] Patient  Information 2014 [place], [place]. Moderate Sedation, Adult Moderate sedation is given to help you relax or even sleep through a procedure. You may remain sleepy, be clumsy, or have poor balance for several hours following this procedure. Arrange for a responsible adult, family member, or friend to take you home. A responsible adult should stay with you for at least 24 hours or until the medicines have worn off.  Do not participate in any activities where you could become injured for the next 24 hours, or until you feel normal again. Do not:  Drive.  Swim.  Ride a bicycle.  Operate heavy machinery.  Cook.  Use power tools.  Climb ladders.  Work at [employer].  Do not make important decisions or sign legal documents until you are improved.  Vomiting may occur if you eat too soon. When you can drink without vomiting, try water, juice, or soup. Try solid foods if you feel little or no nausea.  Only take over-the-counter or prescription medications for pain, discomfort, or fever as directed by your caregiver.If pain medications have been prescribed for you, ask your caregiver how soon it is safe to take them.  Make sure you and your family fully understands everything about the medication given to you. Make sure you understand what side effects may occur.  You should not drink alcohol, take sleeping pills, or medications that cause drowsiness for at least 24 hours.  If you smoke, do not smoke alone.  If you are feeling better, you may resume normal activities 24 hours after receiving sedation.  Keep all appointments as scheduled. Follow all instructions.  Ask questions if you do not understand. SEEK MEDICAL CARE IF:   Your skin is pale or bluish in color.  You continue to feel sick to your stomach (nauseous) or throw up (vomit).  Your pain is getting worse and not helped by medication.  You have bleeding or swelling.  You are still sleepy or feeling clumsy after 24  hours. SEEK IMMEDIATE MEDICAL CARE IF:   You develop a rash.  You have difficulty breathing.  You develop any type of allergic problem.  You have a fever. Document Released: 05/13/2001 Document Revised: 11/10/2011 Document Reviewed: 10/04/2007 [facility] Patient Information 2014 [place], [place].

## 2013-01-25 NOTE — Procedures (Signed)
Procedure:  Ultrasound guided left groin lymphocele drainage Findings:  Clear fluid in left inguinal lymphocele.  10 Fr drain placed and connected to suction bulb.  300 mL removed at time of drainage.

## 2013-01-25 NOTE — H&P (Signed)
Agree 

## 2013-01-26 ENCOUNTER — Other Ambulatory Visit (HOSPITAL_COMMUNITY): Payer: Self-pay

## 2013-01-28 ENCOUNTER — Encounter: Payer: Self-pay | Admitting: Gynecologic Oncology

## 2013-01-28 NOTE — Progress Notes (Signed)
Received EPP from patient. Her brother in law lives with them and they claim in on taxes(disability).  She is not working just her hubby and she is trying to get disability and I have medicaid application to apply(see prev notes) 25% ind 01/28/13-07/31/13. I will send letter and card in mail. All documents are scanned in.

## 2013-01-31 ENCOUNTER — Other Ambulatory Visit: Payer: Self-pay | Admitting: Gynecologic Oncology

## 2013-01-31 DIAGNOSIS — C519 Malignant neoplasm of vulva, unspecified: Secondary | ICD-10-CM

## 2013-01-31 MED ORDER — TRAMADOL HCL 50 MG PO TABS: 50.0000 mg | ORAL_TABLET | Freq: Four times a day (QID) | ORAL | Status: DC | PRN

## 2013-01-31 NOTE — Progress Notes (Signed)
Patient called requesting additional pain medication.  Reporting moderate, intermittent, sharp pains around the drain insertion site.  No issues reported with the drain.  She states it has been draining 200-300cc daily.  No drainage or redness around the site.  Instructed that Tramadol 50 mg Q6H PRN moderate to severe pain would be sent over to [facility].  Reportable signs and symptoms reviewed.  Instructed to call for any needs.

## 2013-02-01 ENCOUNTER — Ambulatory Visit: Payer: Self-pay | Admitting: Physical Therapy

## 2013-02-03 ENCOUNTER — Telehealth: Payer: Self-pay | Admitting: Gynecologic Oncology

## 2013-02-03 NOTE — Telephone Encounter (Signed)
Patient called with complaints of moderate itching of the nose when taking Ultram.  "I am about to scratch my nose off."  She continues to report pain of a 4 to 5 around the left groin due to drain in place.  Reporting 250 cc to 300 cc daily output.  Lymph fluid remains clear.  Asking if she would be able to get into a swimming pool when she goes on vacation mid June with the drain in place.  Advised to avoid submerging herself into water with the drain in place.  Verbalizing understanding.  Advised to stop taking Tramadol.  Advised that a refill of Hydrocodone/APAP 5/325 1 to 2 tablets every six hours as needed for moderate to severe pain, #45, 0 refills, would be called into [facility].  Reportable signs and symptoms reviewed.  Advised to call the office with an update on drain output and to call for any needs or concerns.

## 2013-02-08 ENCOUNTER — Encounter: Payer: Self-pay | Admitting: Gynecologic Oncology

## 2013-02-08 NOTE — Progress Notes (Signed)
Patient left message about discount and paying balance. I called her back and advised her of calling billing and would have to set up payment plan for difference. She said not sure if she can do that. I did advise her cervical grant [phone] to help with meds. I Insurance claims handler. She will take the card and/or letter when getting meds.

## 2013-02-14 ENCOUNTER — Telehealth: Payer: Self-pay | Admitting: Gynecologic Oncology

## 2013-02-14 NOTE — Telephone Encounter (Signed)
Patient called stating drain insertion site is "becoming angry."  Reporting mild erythema around the site with minimal drainage.  25 cc of clear, yellow fluid draining every two to three hours.  Continuing to report moderate pain at the insertion site.  She is leaving to go out of town on [place].  Appointment made for tomorrow to assess drain site.  Instructed to call for any concerns.

## 2013-02-15 ENCOUNTER — Encounter: Payer: Self-pay | Admitting: Gynecologic Oncology

## 2013-02-15 ENCOUNTER — Ambulatory Visit: Payer: Self-pay | Attending: Gynecologic Oncology | Admitting: Gynecologic Oncology

## 2013-02-15 VITALS — BP 110/62 | HR 84 | Temp 97.9°F | Resp 12 | Wt 100.3 lb

## 2013-02-15 DIAGNOSIS — B370 Candidal stomatitis: Secondary | ICD-10-CM

## 2013-02-15 DIAGNOSIS — C519 Malignant neoplasm of vulva, unspecified: Secondary | ICD-10-CM

## 2013-02-15 MED ORDER — FLUCONAZOLE 150 MG PO TABS: 150.0000 mg | ORAL_TABLET | Freq: Once | ORAL | Status: DC

## 2013-02-15 MED ORDER — TRAMADOL HCL 50 MG PO TABS: 50.0000 mg | ORAL_TABLET | Freq: Four times a day (QID) | ORAL | Status: DC | PRN

## 2013-02-15 NOTE — Patient Instructions (Signed)
Avoid scratching your left shin.  We will arrange your IR procedure.  Please call for any questions or concerns.

## 2013-02-15 NOTE — Progress Notes (Signed)
Follow Up Note: Gyn-Onc  Autumn Turner 52 y.o. female  CC:  Chief Complaint  Patient presents with  . 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  S/P radical vulvectomy and bilateral groin dissection December 07, 2012 by Dr. Nelly Rout. Post-operatively, she has developed a recurring left inguinal lymphocyst.  It had been drained four times in the office.  On Jan 25, 2013, she underwent ultrasound guided left groin lymphocele drain placement by Dr. Fredia Sorrow.  Interval History:  She presents today for evaluation of complaints of drainage and redness around the left groin drain site.  She reports that the drain insertion site is "becoming angry." Reporting mild erythema around the site with minimal drainage. 25 cc of clear, yellow fluid draining every two to three hours. She reports having 100 cc total daily output with limited activity and 200 cc when ambulating frequently.  Continuing to report moderate pain at the insertion site. She is leaving to go out of town on Wednesday.  She reports having chronic issues with oral thrush and is requesting a refill on diflucan.  She also requests a refill on Tramadol due to moderate pain at the drain site.  She continues to scratch the area of irritation to the shin of her left lower extremity.  No other concerns voiced.  Review of Systems  Constitutional: Feels well.  Cardiovascular: No chest pain, shortness of breath, or edema.  Pulmonary: No cough or wheeze.  Gastrointestinal: No nausea, vomiting, or diarrhea. No bright red blood per rectum or change in bowel movement.  Genitourinary: No frequency, urgency, or dysuria. No vaginal bleeding or discharge.  Musculoskeletal: No myalgia or joint pain. Neurologic: No weakness, numbness, or change in gait.  Psychology: No depression, anxiety, or  insomnia.  Current Meds:  Outpatient Encounter Prescriptions as of 02/15/2013  Medication Sig Dispense Refill  . cephALEXin (KEFLEX) 250 MG/5ML suspension Take 500 mg by mouth 3 (three) times daily. Take for 7 days      . cyanocobalamin (,VITAMIN B-12,) 1000 MCG/ML injection Inject 1 mL (1,000 mcg total) into the muscle every 30 (thirty) days.  10 mL  0  . diphenhydrAMINE (BENADRYL) 12.5 MG chewable tablet Chew 12.5 mg by mouth 4 (four) times daily as needed for allergies.      . fluconazole (DIFLUCAN) 150 MG tablet Take 1 tablet (150 mg total) by mouth once.  2 tablet  2  . HYDROcodone-acetaminophen (NORCO/VICODIN) 5-325 MG per tablet Take 1 tablet by mouth 3 (three) times daily as needed for pain.      . traMADol (ULTRAM) 50 MG tablet Take 1 tablet (50 mg total) by mouth every 6 (six) hours as needed (moderate to severe pain).  30 tablet  0  . vitamin C (ASCORBIC ACID) 500 MG tablet Take 500 mg by mouth daily.      . [DISCONTINUED] traMADol (ULTRAM) 50 MG tablet Take 1 tablet (50 mg total) by mouth every 6 (six) hours as needed (moderate to severe pain).  60 tablet  0   No facility-administered encounter medications on file as of 02/15/2013.    Allergy:  Allergies  Allergen Reactions  . Iohexol Hives     Code: HIVES   . Nsaids     Due to Hx of gastric bypass  . Ciprocinonide (Fluocinolone) Hives and Rash    Social Hx:   History   Social History  .  Marital Status: Married    Spouse Name: N/A    Number of Children: N/A  . Years of Education: N/A   Occupational History  . Not on file.   Social History Main Topics  . Smoking status: Current Some Day Smoker -- 0.50 packs/day for 20 years    Types: Cigarettes  . Smokeless tobacco: Never Used  . Alcohol Use: No  . Drug Use: No  . Sexually Active: Not on file   Other Topics Concern  . Not on file   Social History Narrative  . No narrative on file    Past Surgical Hx:  Past Surgical History  Procedure Laterality Date   . Cesarean section    . Gastric bypass  2001  . Gallbladder surgery  2002  . Tummy tuck  2004  . Leep  06/2009  . Cholecystectomy    . Tonsillectomy      age 8 adnoids  . Vulvectomy Bilateral 12/07/2012    Procedure: VULVECTOMY AND BILATERAL INGUINAL LYMPH NODE DISSECTION, PAP SMEAR;  Surgeon: Laurette Schimke, MD;  Location: [facility];  Service: Gynecology;  Laterality: Bilateral;    Past Medical Hx:  Past Medical History  Diagnosis Date  . Hypertension   . Abnormal Pap smear   . Vulvar lesion   . CIN II (cervical intraepithelial neoplasia II)   . Malnourished   . Sleep apnea     Loss weight with gastric bypass  . Cancer     vulvar  . Anemia     blood transfusion    Family Hx:  Family History  Problem Relation Age of Onset  . Cancer Father   . Hypertension Father   . Lung cancer Father     Vitals:  Blood pressure 110/62, pulse 84, temperature 97.9 F (36.6 C), temperature source Oral, resp. rate 12, weight 100 lb 4.8 oz (45.496 kg).  Physical Exam:  General: Thin, malnourished appearing female in no acute distress. Alert and oriented x 3.  Cardiovascular: Regular rate and rhythm. S1 and S2 normal.  Lungs: Clear to auscultation bilaterally. No wheezes/crackles/rhonchi noted.  Skin:  Large area of erythema and excoriation on the shin of the left lower extremity.  No drainage present.  Right groin incision well healed with no drainage or erythema.  Drain insertion site to anterior left upper thigh with mild erythema and trace drainage.  No signs of infection.  Drain care and skin care reinforced.  Clear, light yellow fluid draining into drain bulb. Back: No CVA tenderness.  Abdomen: Abdomen soft, non-tender and thin. Active bowel sounds in all quadrants. No evidence of a fluid wave or abdominal masses.  Extremities: No bilateral cyanosis, edema, or clubbing.   Assessment/Plan:  She is advised to avoid scratching her left lower leg shin and to use triple antibiotic ointment to  that area.  Diflucan reordered as a one time event until she can see her [facility] physician.  Tramadol refilled per patient request.  Reportable signs and symptoms reviewed.  Dr. Nelly Rout in the room for the discussion.  She is informed of the recommendation to have the left groin lymph channel sclerosed by Dr. Fredia Sorrow per Dr. Nelly Rout.  She is advised to call for any questions or concerns.  She will be returning from her vacation on June 30 and the IR procedure will be arranged.     CROSS, MELISSA DEAL, NP 02/15/2013, 5:03 PM

## 2013-02-17 ENCOUNTER — Other Ambulatory Visit: Payer: Self-pay | Admitting: Gynecologic Oncology

## 2013-02-17 DIAGNOSIS — I898 Other specified noninfective disorders of lymphatic vessels and lymph nodes: Secondary | ICD-10-CM

## 2013-03-03 ENCOUNTER — Other Ambulatory Visit: Payer: Self-pay | Admitting: Radiology

## 2013-03-03 ENCOUNTER — Telehealth: Payer: Self-pay | Admitting: Gynecologic Oncology

## 2013-03-03 ENCOUNTER — Other Ambulatory Visit: Payer: Self-pay | Admitting: Gynecologic Oncology

## 2013-03-03 DIAGNOSIS — C519 Malignant neoplasm of vulva, unspecified: Secondary | ICD-10-CM

## 2013-03-03 NOTE — Telephone Encounter (Signed)
Patient informed of upcoming appt on Monday, July 7 for possible drain removal.  Instructed to arrive at 2:00pm.  Verbalizing understanding.  Instructed to call for any needs.

## 2013-03-07 ENCOUNTER — Other Ambulatory Visit: Payer: Self-pay | Admitting: Gynecologic Oncology

## 2013-03-07 ENCOUNTER — Encounter (HOSPITAL_COMMUNITY): Payer: Self-pay | Admitting: Pharmacy Technician

## 2013-03-07 ENCOUNTER — Ambulatory Visit (HOSPITAL_COMMUNITY)
Admission: RE | Admit: 2013-03-07 | Discharge: 2013-03-07 | Disposition: A | Discharge status: Home / Self Care | Payer: Self-pay | Source: Ambulatory Visit | Attending: Gynecologic Oncology | Admitting: Gynecologic Oncology

## 2013-03-07 DIAGNOSIS — C519 Malignant neoplasm of vulva, unspecified: Secondary | ICD-10-CM

## 2013-03-07 DIAGNOSIS — B370 Candidal stomatitis: Secondary | ICD-10-CM

## 2013-03-07 MED ORDER — FLUCONAZOLE 100 MG PO TABS: 100.0000 mg | ORAL_TABLET | Freq: Every day | ORAL | Status: DC

## 2013-03-07 MED ORDER — TRAMADOL HCL 50 MG PO TABS: 50.0000 mg | ORAL_TABLET | Freq: Four times a day (QID) | ORAL | Status: DC | PRN

## 2013-03-11 ENCOUNTER — Inpatient Hospital Stay (HOSPITAL_COMMUNITY): Admission: RE | Admit: 2013-03-11 | Payer: Self-pay | Source: Ambulatory Visit

## 2013-03-21 ENCOUNTER — Telehealth: Payer: Self-pay | Admitting: Gynecologic Oncology

## 2013-03-21 DIAGNOSIS — R3915 Urgency of urination: Secondary | ICD-10-CM

## 2013-03-21 DIAGNOSIS — R350 Frequency of micturition: Secondary | ICD-10-CM

## 2013-03-21 NOTE — Telephone Encounter (Signed)
Patient called with complaints of urinary urgency and frequency.  Stating since yesterday "I have got to go all the time and when I get there, there is only a dribble.  Denies hematuria or burning.  Reporting temp of 99.2 with her normal around 97.8.  No chills.  "Have to urinate all the time."  Advised to come in tomorrow am to give a urine sample for analysis and culture.  Informed that she would be contacted with the results of the analysis mid morning.  Advised to call with any concerns prior to that time.

## 2013-03-22 ENCOUNTER — Other Ambulatory Visit: Payer: Self-pay | Admitting: Lab

## 2013-03-22 ENCOUNTER — Other Ambulatory Visit: Payer: Self-pay | Admitting: Gynecologic Oncology

## 2013-03-22 ENCOUNTER — Other Ambulatory Visit: Payer: Self-pay | Admitting: Family Medicine

## 2013-03-22 DIAGNOSIS — B370 Candidal stomatitis: Secondary | ICD-10-CM

## 2013-03-22 DIAGNOSIS — R3915 Urgency of urination: Secondary | ICD-10-CM

## 2013-03-22 DIAGNOSIS — R350 Frequency of micturition: Secondary | ICD-10-CM

## 2013-03-22 DIAGNOSIS — C519 Malignant neoplasm of vulva, unspecified: Secondary | ICD-10-CM

## 2013-03-22 LAB — URINALYSIS, MICROSCOPIC - CHCC
Bilirubin (Urine): POSITIVE
Blood: NEGATIVE
Ketones: NEGATIVE mg/dL
pH: 6 (ref 4.6–8.0)

## 2013-03-22 MED ORDER — TRAMADOL HCL 50 MG PO TABS: 50.0000 mg | ORAL_TABLET | Freq: Four times a day (QID) | ORAL | Status: DC | PRN

## 2013-03-22 MED ORDER — SULFAMETHOXAZOLE-TRIMETHOPRIM 800-160 MG PO TABS: 1.0000 | ORAL_TABLET | Freq: Two times a day (BID) | ORAL | Status: DC

## 2013-03-22 NOTE — Progress Notes (Signed)
Notified of urinalysis results.  Advised that Bactrim would be sent in.  Reportable signs and symptoms reviewed.  Instructed that she would be notified of urine culture results when available.

## 2013-03-22 NOTE — Progress Notes (Signed)
Patient called requesting refill on tramadol.  Reporting moderate groin pain at times.  Informed that one additional refill would be sent to [facility] outpatient pharmacy.  Informed that she would be contacted with the results of her urine sample.

## 2013-03-23 LAB — URINE CULTURE

## 2013-03-24 ENCOUNTER — Telehealth: Payer: Self-pay | Admitting: Gynecologic Oncology

## 2013-03-24 NOTE — Telephone Encounter (Signed)
Patient informed of urine culture results.  Reporting that she feels somewhat better.  Reportable signs and symptoms reviewed.  Instructed to call if symptoms worsen or persist.

## 2013-03-28 ENCOUNTER — Telehealth: Payer: Self-pay | Admitting: Gynecologic Oncology

## 2013-03-28 DIAGNOSIS — R3915 Urgency of urination: Secondary | ICD-10-CM

## 2013-03-28 MED ORDER — SULFAMETHOXAZOLE-TRIMETHOPRIM 800-160 MG PO TABS: 1.0000 | ORAL_TABLET | Freq: Two times a day (BID) | ORAL | Status: DC

## 2013-03-28 NOTE — Telephone Encounter (Signed)
Patient called with complaints of continued urinary symptoms.  "When I urinate, it is not a full stream.  Denies dysuria, urgency, frequency, or hematuria.  Reporting urine as clear with no odor.  She has been increasing her water intake.  Reporting some relief when taking Bactrim for three days.  Requesting additional three days of Bactrim treatment to treat symptoms even though culture resulted no growth.  She reports taking Ultram "very often throughout the day."  Instructed to stop taking ultram at this time.  Reporting moderate spasm-like pain intermittently in the lower abdomen.  She states that she does not want to come in for lab work at this time.  Instructed that another 3 day supply of Bactrim would be sent to her pharmacy to take if symptoms persist.  Instructed to stop taking Ultram and that hydrocodone/APAP would be called in for her to take AS NEEDED.  She is to contact the office by Wednesday if symptoms worsen or persist and she is agreeable to come in for lab work at that time.  Instructed to call for any needs until that time and reportable signs and symptoms reviewed.

## 2013-03-31 ENCOUNTER — Telehealth: Payer: Self-pay | Admitting: Gynecologic Oncology

## 2013-03-31 DIAGNOSIS — B370 Candidal stomatitis: Secondary | ICD-10-CM

## 2013-03-31 DIAGNOSIS — C519 Malignant neoplasm of vulva, unspecified: Secondary | ICD-10-CM

## 2013-03-31 MED ORDER — TRAMADOL HCL 50 MG PO TABS: 50.0000 mg | ORAL_TABLET | Freq: Four times a day (QID) | ORAL | Status: DC | PRN

## 2013-03-31 NOTE — Telephone Encounter (Signed)
Patient called requesting refill on Tramadol.  Reporting improvement in urinary symptoms.  States that she did not take Vicodin because it made her severely nauseated.  Informed that she would receive only one more refill on Tramadol for left groin pain.  Reportable signs and symptoms reviewed.  Instructed to call for any needs.

## 2013-05-03 ENCOUNTER — Encounter: Payer: Self-pay | Admitting: Gynecologic Oncology

## 2013-05-03 ENCOUNTER — Ambulatory Visit: Payer: Self-pay | Attending: Gynecologic Oncology | Admitting: Gynecologic Oncology

## 2013-05-03 VITALS — BP 130/75 | HR 81 | Temp 99.2°F | Resp 16 | Ht 66.0 in | Wt 98.0 lb

## 2013-05-03 DIAGNOSIS — R609 Edema, unspecified: Secondary | ICD-10-CM | POA: Insufficient documentation

## 2013-05-03 DIAGNOSIS — R634 Abnormal weight loss: Secondary | ICD-10-CM | POA: Insufficient documentation

## 2013-05-03 DIAGNOSIS — R209 Unspecified disturbances of skin sensation: Secondary | ICD-10-CM | POA: Insufficient documentation

## 2013-05-03 DIAGNOSIS — C519 Malignant neoplasm of vulva, unspecified: Secondary | ICD-10-CM | POA: Insufficient documentation

## 2013-05-03 NOTE — Patient Instructions (Addendum)
F/U with Dr. Kevil Bing F/U with Gyn Onc in 3 months   Thank you very much Ms. Autumn Turner for allowing me to provide care for you today.  I appreciate your confidence in choosing our Gynecologic Oncology team.  If you have any questions about your visit today please call our office and we will get back to you as soon as possible.  [name]. Ramar Nobrega MD., PhD Gynecologic Oncology

## 2013-05-03 NOTE — Progress Notes (Signed)
Gyn-Onc visit.    Autumn Turner 52 y.o. female  CC: Vulvar cancer.  Assessment/Plan:  52 y.o.  with stage II invasive squamous cell carcinoma vulva.S/P radical vulvectomy and bilateral groin dissection December 07, 2012. Complicated by lymphocyst.  Surgical sites healed.  Limited pelvic examination because of perineal discomfort.  Followup in  3 months Follow-up with Dr. Senaida Ores in 6 months  Weight loss Follow-up with Dr. [place] Bing    HPI: Autumn Turner  is a 52 y.o. gravida 2 has been having itching and pain in the vulva for more than 6 months. She states it would flare about every 3-4 months with burning and irritation and then go away. Past summer of 2013 it flared much more significantly and she had significant increase in pain and itching. She looks that the lesion with the mirror she sought white exudate and she took a fungal medication that needed improved. She got that was most likely yeast infection as the antifungal medication helped. She went back to the physician to get a refill on the fungal medication and her daughter encouraged her to have a pelvic exam. At that time the pelvic exam revealed a probable vulvar cancer she was referred to Dr. Senaida Ores.  Dr. Senaida Ores saw the patient on October 22 2012 performed a vulvar biopsy that revealed an invasive squamous cell carcinoma of the vulva.   On 12/07/2012 she underwent posterior radical vulvecatomy Bilateral inguinal LND.  Path c/w stage II moderately differentiated squamous cell cancer.  No LVSI, perineural involvement.   Resection margins negative. Pap negative  At the visit on December 16 2012, cellulitis was noted at the site of the right inguinal lymph node dissection. She was treated with Keflex with resolution of the cellulitis. The patient subsequently  presented with bilateral groin breakdown and  left groin lymphocyst with voluminous output. Drain was placed by interventional radiology.  ROS:  Poor apetite, weight loss,  unable to eat without tramadol. Bilateral inner thigh numbness, no change in motor strength Vitals:  Blood pressure 130/75, pulse 81, temperature 99.2 F (37.3 C), temperature source Oral, resp. rate 16, height 5\' 6"  (1.676 m), weight 98 lb (44.453 kg).  Physical Exam: Thin anxious appearing female in no acute distress. Patient is not optimistic or positive about any subject broached today CHEST:  CTA LN:  No cervical supra clavicular or inguinal adenopathy BACK:  No CVAT PELVIC:  Groin incisions well healed without lymphocyst.  Patient unable to tolerate speculum or digital examination.  Incisions healed.  On limited exam, no palpable masses. Extremities: 1-2+  Edema BLE with excoriations of the left leg.   Laurette Schimke, MD 05/03/2013, 11:53 AM

## 2013-06-09 ENCOUNTER — Ambulatory Visit: Payer: Self-pay | Admitting: Family Medicine

## 2013-06-09 ENCOUNTER — Encounter: Payer: Self-pay | Admitting: Family Medicine

## 2013-06-09 ENCOUNTER — Telehealth: Payer: Self-pay

## 2013-06-09 VITALS — BP 130/82 | HR 82 | Temp 98.5°F | Resp 16 | Ht 64.5 in | Wt 101.2 lb

## 2013-06-09 DIAGNOSIS — E46 Unspecified protein-calorie malnutrition: Secondary | ICD-10-CM

## 2013-06-09 DIAGNOSIS — E538 Deficiency of other specified B group vitamins: Secondary | ICD-10-CM

## 2013-06-09 MED ORDER — TRAMADOL HCL 50 MG PO TABS: 50.0000 mg | ORAL_TABLET | Freq: Three times a day (TID) | ORAL | Status: DC | PRN

## 2013-06-09 MED ORDER — CYANOCOBALAMIN 1000 MCG/ML IJ SOLN: 1000.0000 ug | INTRAMUSCULAR | Status: DC

## 2013-06-09 NOTE — Telephone Encounter (Signed)
Pt saw Dr.L today. Would like tramadol called in for nose and mouth pain. Will use [facility] on the [address] of [place] and [address]. Call at [phone].

## 2013-06-09 NOTE — Progress Notes (Signed)
52 yo vulvar cancer survivor (surgery last April complicated by post op dehiscence, dental sores and broken teeth, paresthesias in fingers.  No radiation or chemotherapy.  Married  Concerned about malnutrition as weight has been a problem.  She is having problems with food because so much of it tastes sweet.  She can eat [place] Yogurt, cheese, vegetables (legumes).  She cannot eat much at a time.  She has not been able to work.  She works sometime at [employer].  (got a second degree a few years ago)  Objective:  NAD Petite woman with wasted thighs HEENT: unremarkable except for obvious thrush Chest:  Clear Heart:  Reg, no murmur Abdomen:  Soft without masses or tenderness Skin:  No rash Results for orders placed in visit on 03/22/13  URINE CULTURE      Result Value Range   Urine Culture, Routine Culture, Urine    URINALYSIS, MICROSCOPIC - [facility]      Result Value Range   Glucose Negative  Negative mg/dL   Bilirubin (Urine) Positive by Dipstix  Negative   Ketones Negative  Negative mg/dL   Specific Gravity, Urine 1.025  1.003 - 1.035   Blood Negative  Negative   pH 6.0  4.6 - 8.0   Protein < 30  Negative- <30 mg/dL   Urobilinogen, UR 0.2  0.2 - 1 mg/dL   Nitrite Negative  Negative   Leukocyte Esterase Negative  Negative   RBC / HPF 0-2  0 - 2   WBC, UA 3-6  0 - 2   Bacteria, UA Moderate  Negative- Trace   Epithelial Cells Many  Negative- Few   Mucus, UA Large  Negative- Small   Assessment:  Patient has developed a weak and wasted body habitus following treatment and complications for vulvar cancer.  She is unable to work at this point   Plan:  Increase caloric intake with multiple smaller meals, vitamin supplementation (Vitamin B12, C, D).  Recheck in one month Vitamin B12 deficiency - Plan: cyanocobalamin ((VITAMIN B-12)) injection 1,000 mcg  Unspecified protein-calorie malnutrition  Signed, Elvina Sidle, MD

## 2013-06-09 NOTE — Telephone Encounter (Signed)
Spoke w/Dr L and he advised me to send Rx for Tramadol 50 mg 1 tab Q8hrs prn #30, NR. Called in Rx (entered in EPIC verbal w/readback) and notified pt.

## 2013-06-10 ENCOUNTER — Telehealth: Payer: Self-pay | Admitting: Family Medicine

## 2013-06-10 NOTE — Telephone Encounter (Signed)
Error in opening chart

## 2013-06-10 NOTE — Telephone Encounter (Signed)
LMOM to CB to get her B12 injection. Pt was here for appt on 06/08/13 but did not receive this injection?

## 2013-06-27 ENCOUNTER — Other Ambulatory Visit: Payer: Self-pay | Admitting: Radiology

## 2013-06-27 ENCOUNTER — Other Ambulatory Visit: Payer: Self-pay | Admitting: Family Medicine

## 2013-07-21 ENCOUNTER — Other Ambulatory Visit: Payer: Self-pay | Admitting: Family Medicine

## 2013-08-01 ENCOUNTER — Other Ambulatory Visit: Payer: Self-pay | Admitting: Family Medicine

## 2013-08-03 ENCOUNTER — Other Ambulatory Visit: Payer: Self-pay

## 2013-08-11 ENCOUNTER — Encounter: Payer: Self-pay | Admitting: Family Medicine

## 2013-08-11 ENCOUNTER — Ambulatory Visit (INDEPENDENT_AMBULATORY_CARE_PROVIDER_SITE_OTHER): Payer: Self-pay | Admitting: Family Medicine

## 2013-08-11 ENCOUNTER — Ambulatory Visit: Payer: Self-pay | Admitting: Gynecologic Oncology

## 2013-08-11 VITALS — BP 141/87 | HR 86 | Temp 97.8°F | Resp 16 | Ht 64.5 in | Wt 93.2 lb

## 2013-08-11 DIAGNOSIS — E440 Moderate protein-calorie malnutrition: Secondary | ICD-10-CM

## 2013-08-11 DIAGNOSIS — C519 Malignant neoplasm of vulva, unspecified: Secondary | ICD-10-CM

## 2013-08-11 DIAGNOSIS — F329 Major depressive disorder, single episode, unspecified: Secondary | ICD-10-CM

## 2013-08-11 DIAGNOSIS — F32A Depression, unspecified: Secondary | ICD-10-CM

## 2013-08-11 DIAGNOSIS — B370 Candidal stomatitis: Secondary | ICD-10-CM

## 2013-08-11 LAB — COMPREHENSIVE METABOLIC PANEL
ALT: 27 U/L (ref 0–35)
AST: 32 U/L (ref 0–37)
Albumin: 2.4 g/dL — ABNORMAL LOW (ref 3.5–5.2)
Alkaline Phosphatase: 145 U/L — ABNORMAL HIGH (ref 39–117)
BUN: 11 mg/dL (ref 6–23)
CO2: 29 mEq/L (ref 19–32)
Calcium: 8.5 mg/dL (ref 8.4–10.5)
Chloride: 103 mEq/L (ref 96–112)
Creat: 0.52 mg/dL (ref 0.50–1.10)
Glucose, Bld: 68 mg/dL — ABNORMAL LOW (ref 70–99)
Potassium: 3.9 mEq/L (ref 3.5–5.3)
Sodium: 143 mEq/L (ref 135–145)
Total Bilirubin: 0.2 mg/dL — ABNORMAL LOW (ref 0.3–1.2)
Total Protein: 5 g/dL — ABNORMAL LOW (ref 6.0–8.3)

## 2013-08-11 LAB — VITAMIN B12: Vitamin B-12: >2000 pg/mL — ABNORMAL HIGH (ref 211–911)

## 2013-08-11 LAB — TSH: TSH: 0.65 u[IU]/mL (ref 0.350–4.500)

## 2013-08-11 MED ORDER — FLUCONAZOLE 100 MG PO TABS: 100.0000 mg | ORAL_TABLET | Freq: Every day | ORAL | Status: DC

## 2013-08-11 MED ORDER — SERTRALINE HCL 50 MG PO TABS: 50.0000 mg | ORAL_TABLET | Freq: Every day | ORAL | Status: DC

## 2013-08-11 NOTE — Progress Notes (Signed)
This is a 52 year old woman has continued to lose weight. She is applying for disability.  Patient's problems really began when she had gastric bypass surgery in 2001. After that surgery she had hyperalimentation treatments which reversed a rapid weight loss. Nevertheless over the last year, patient has lost over 20 pounds. In the last month she's lost 8 pounds. She's been trying nutritional supplementation with Ensure but she finds that she throws up much of these supplements.  Her husband continues to work the patient has no insurance. She says that she has become tired but that she's able to do all other activities of daily living. She does get a little short of breath when she is going up stairs. She has no localized tenderness.  Patient has been is been getting her B12 shots weekly.  Objective: Patient appears emaciated. She's in no acute distress HEENT: Erythematous mucosal membranes in her mouth with bad dentition consisting of multiple dental caries. Neck: Supple no adenopathy or thyromegaly Chest: Clear Heart: Regular no murmur: Abdomen very cachectic, mildly tender in the right lower quadrant, no masses or HSM palpated Skin: No rash seen  Assessment: Very concerned about this patient's nutritional status. Her continued weight loss in the face of supplementation suggest that this downward trend will continue unless we do something to correct her absorption.  Plan:Moderate malnutrition - Plan: Comprehensive metabolic panel, CBC, TSH, Ambulatory referral to Gastroenterology, Vitamin B12  Vulvar cancer - Plan: fluconazole (DIFLUCAN) 100 MG tablet  Oral candidiasis - Plan: fluconazole (DIFLUCAN) 100 MG tablet  Depression - Plan: sertraline (ZOLOFT) 50 MG tablet  Signed, Elvina Sidle, MD

## 2013-08-12 LAB — CBC
HCT: 30.7 % — ABNORMAL LOW (ref 36.0–46.0)
Hemoglobin: 8.9 g/dL — ABNORMAL LOW (ref 12.0–15.0)
MCH: 18.9 pg — ABNORMAL LOW (ref 26.0–34.0)
MCHC: 29 g/dL — ABNORMAL LOW (ref 30.0–36.0)
MCV: 65.2 fL — ABNORMAL LOW (ref 78.0–100.0)
Platelets: 497 10*3/uL — ABNORMAL HIGH (ref 150–400)
RBC: 4.71 MIL/uL (ref 3.87–5.11)
RDW: 21.3 % — ABNORMAL HIGH (ref 11.5–15.5)
WBC: 5.4 10*3/uL (ref 4.0–10.5)

## 2013-09-05 ENCOUNTER — Other Ambulatory Visit: Payer: Self-pay | Admitting: Family Medicine

## 2013-09-07 ENCOUNTER — Telehealth: Payer: Self-pay

## 2013-09-07 DIAGNOSIS — C519 Malignant neoplasm of vulva, unspecified: Secondary | ICD-10-CM

## 2013-09-07 DIAGNOSIS — B370 Candidal stomatitis: Secondary | ICD-10-CM

## 2013-09-07 NOTE — Telephone Encounter (Signed)
Needs follow up appointment.  

## 2013-09-07 NOTE — Telephone Encounter (Signed)
PT STATES HER PHARMACY IS TRYING TO GET A REFILL ON HER TRAMADOL AND DIFLUCAN, PLEASE CALL PT AT [phone]   [facility] ON [address]

## 2013-09-07 NOTE — Telephone Encounter (Signed)
Called her to advise.  

## 2013-09-08 ENCOUNTER — Ambulatory Visit: Payer: Self-pay | Admitting: Gynecologic Oncology

## 2013-09-19 ENCOUNTER — Other Ambulatory Visit: Payer: Self-pay | Admitting: Family Medicine

## 2013-09-19 ENCOUNTER — Telehealth: Payer: Self-pay

## 2013-09-19 DIAGNOSIS — B370 Candidal stomatitis: Secondary | ICD-10-CM

## 2013-09-19 DIAGNOSIS — C519 Malignant neoplasm of vulva, unspecified: Secondary | ICD-10-CM

## 2013-09-19 MED ORDER — FLUCONAZOLE 100 MG PO TABS: 100.0000 mg | ORAL_TABLET | Freq: Every day | ORAL | Status: DC

## 2013-09-19 NOTE — Telephone Encounter (Signed)
Pt states the breaking out on her mouth has returned,she does not have money for ov,would Like meds called to pharmacy   Best phone [phone]   Pharmacy [facility] [address]

## 2013-09-26 ENCOUNTER — Other Ambulatory Visit: Payer: Self-pay | Admitting: Family Medicine

## 2013-09-28 NOTE — Telephone Encounter (Signed)
Dr Joseph Art, I dont know if the first issue was addressed. Please advise.

## 2013-09-28 NOTE — Telephone Encounter (Signed)
I don't want to refill the tramadol until patient has seen gastrointestinal doctor.

## 2013-09-28 NOTE — Telephone Encounter (Signed)
Patient is requesting Tramadol.   [facility] on [address]  [phone]

## 2013-09-29 ENCOUNTER — Ambulatory Visit: Payer: Self-pay | Admitting: Gynecologic Oncology

## 2013-09-29 MED ORDER — TRAMADOL HCL 50 MG PO TABS: 50.0000 mg | ORAL_TABLET | Freq: Three times a day (TID) | ORAL | Status: DC | PRN

## 2013-09-29 NOTE — Telephone Encounter (Signed)
Blisters localized inside mouth and around lips. Mouth is swollen, she ran out of medication 1.5 week so we have sent in diflucan for her. She is wanting to get Tramadol for her oral pain, not GI issues.  I asked her if she has tried any OTC medications. She said she has tried Tylenol in the past, but it does nothing to help her. Also, she can only take medications tylenol based. I told her that I would follow up with [place] to see what he suggest or if any medication at all.  She will await response.

## 2013-09-29 NOTE — Telephone Encounter (Signed)
I will approve tramadol 50 mg #21 for tid prn use.  Can you call it in?

## 2013-09-29 NOTE — Telephone Encounter (Signed)
Called in  Pt notified

## 2013-10-19 ENCOUNTER — Other Ambulatory Visit: Payer: Self-pay | Admitting: Family Medicine

## 2013-10-20 NOTE — Telephone Encounter (Signed)
If pt is still having mouth ulcers after a month I am concerned. Has she seen GI yet for her weight loss?  If yes what are the plans?

## 2013-10-27 NOTE — Telephone Encounter (Signed)
LMOM to CB. 

## 2013-10-27 NOTE — Telephone Encounter (Signed)
Can someone please call the patient and ask the below questions.

## 2013-10-27 NOTE — Telephone Encounter (Signed)
I'm sorry Autumn Turner, I did not have this message in my in-basket until you sent it back today. I called pt who explained that she has a chronic problem w/thrush d/t being malnourished as result of gastric bypass in 2001. Dr L Rxs diflucan for her to take 1 tab QD to prevent the thrush, but it is expensive and pt spreads it out and doesn't take it every day (about QOD). She currently has a flare up of thrush which is very painful at times and the tramadol helps w/this mouth pain. She has not been able to afford to go to GI yet because she would have to pay OOP for an endoscopy.

## 2013-10-28 NOTE — Telephone Encounter (Signed)
Faxed. Notified pt  

## 2013-10-28 NOTE — Telephone Encounter (Signed)
Done.  I hope she feels better.

## 2013-11-14 ENCOUNTER — Other Ambulatory Visit: Payer: Self-pay | Admitting: Family Medicine

## 2013-11-14 ENCOUNTER — Other Ambulatory Visit: Payer: Self-pay | Admitting: Physician Assistant

## 2013-11-15 NOTE — Telephone Encounter (Signed)
Patient called stated [facility] called in request for refills, Patient stated prescription is for her mouth. Please call patient when prescription is called in. [phone]

## 2013-11-16 NOTE — Telephone Encounter (Signed)
Rx sent and pt notified.

## 2013-11-16 NOTE — Telephone Encounter (Signed)
Faxed

## 2013-11-17 ENCOUNTER — Other Ambulatory Visit: Payer: Self-pay | Admitting: Physician Assistant

## 2013-11-17 ENCOUNTER — Telehealth: Payer: Self-pay

## 2013-12-13 ENCOUNTER — Other Ambulatory Visit: Payer: Self-pay

## 2013-12-13 MED ORDER — FLUCONAZOLE 100 MG PO TABS: ORAL_TABLET | ORAL | Status: DC

## 2013-12-13 MED ORDER — TRAMADOL HCL 50 MG PO TABS: ORAL_TABLET | ORAL | Status: DC

## 2013-12-13 NOTE — Telephone Encounter (Signed)
Called in Tramadol and notified pt both were sent

## 2013-12-13 NOTE — Telephone Encounter (Signed)
Pt LM on VM asking for RFs on the diflucan and tramadol that Dr L Rxs for her mouth blisters. I have pended both for review.

## 2014-03-01 ENCOUNTER — Telehealth: Payer: Self-pay

## 2014-03-01 NOTE — Telephone Encounter (Signed)
Med refills for her mouth traMADol (ULTRAM) 50 MG tablet fluconazole (DIFLUCAN) 100 MG tablet   Patient states the pharmacy always messes this up and will not call them. [phone]

## 2014-03-03 MED ORDER — FLUCONAZOLE 100 MG PO TABS: ORAL_TABLET | ORAL | Status: DC

## 2014-03-03 MED ORDER — TRAMADOL HCL 50 MG PO TABS: ORAL_TABLET | ORAL | Status: DC

## 2014-03-03 NOTE — Telephone Encounter (Signed)
Meds ordered this encounter  Medications  . fluconazole (DIFLUCAN) 100 MG tablet    Sig: TAKE 1 TABLET BY MOUTH DAILY    Dispense:  10 tablet    Refill:  0    Order Specific Question:  Supervising Provider    Answer:  DOOLITTLE, ROBERT P [[id number]]  . traMADol (ULTRAM) 50 MG tablet    Sig: TAKE 1 TABLET BY MOUTH THREE TIMES DAILY AS NEEDED    Dispense:  30 tablet    Refill:  0    Order Specific Question:  Supervising Provider    Answer:  DOOLITTLE, ROBERT P [[id number]]   Please advise this patient that she'll need an OV for additional refills.  We haven't seen her since 08/2013.

## 2014-03-03 NOTE — Telephone Encounter (Signed)
Patient is calling again for her med refills   [phone]

## 2014-03-05 NOTE — Telephone Encounter (Signed)
Pt notified. Faxed Tramadol for her.

## 2014-05-04 ENCOUNTER — Telehealth: Payer: Self-pay

## 2014-05-04 NOTE — Telephone Encounter (Signed)
Patient called. Wants [place] to know that her oral surgeon faxed over a consent form yesterday for Dr. Carlean Jews to sign. Wants to make sure we've received the form and faxed it back. Please return call and advise as pt wishes to have her surgery ASAP. CB # [phone]

## 2014-05-04 NOTE — Telephone Encounter (Signed)
LM for pt- who is surgeon? We have not received a fax.

## 2014-05-04 NOTE — Telephone Encounter (Signed)
Dr. Diona Browner  [phone]  [phone] fax   Spoke to [facility]- they have not faxed yet due to waiting for insurance authorization. It will be faxed on Tuesday.

## 2014-05-11 ENCOUNTER — Telehealth: Payer: Self-pay

## 2014-05-11 NOTE — Telephone Encounter (Signed)
Dr. Joseph Art this was faxed on Tuesday. Please advise if you have received and completed.

## 2014-05-11 NOTE — Telephone Encounter (Signed)
Patient states she spoke with oral surgeon's office yesterday and they advised pt that they faxed [place] a form that Dr. Carlean Jews needs to sign. Patient wants to ensure we received this form from Dr.Jensen's office and that he completes it as soon as possible. Cb # [phone]

## 2014-05-16 NOTE — Telephone Encounter (Signed)
Form was faxed on 05/15/14 and I have placed it in Dr. Pauletta Browns box today. FYI

## 2014-05-18 NOTE — Telephone Encounter (Signed)
After advising pt she requested someone call her after the form is faxed to the dentist. [phone].Thank you

## 2014-05-18 NOTE — Telephone Encounter (Signed)
Pt called in to check on status of fax for oral surgery release. Clarise Cruz has form and will have Dr Joseph Art sign it today and she will fax in to the dentist today. I have notified pt of this status. Thank you

## 2014-05-19 NOTE — Telephone Encounter (Signed)
Forms signed and faxed. Spoke to pt, she is notified of fax and that a copy of her forms will be ready for her p/u

## 2014-05-20 ENCOUNTER — Telehealth: Payer: Self-pay

## 2014-05-20 NOTE — Telephone Encounter (Signed)
Patient came in to [facility] today and dropped off a handicap plate form. Placed it in Dr. Lenn Cal box. Please call her at [phone] when ready for pick-up. Patient has a disability hearing soon and she needs this completed as soon as possible. Thank you!

## 2014-05-21 NOTE — Telephone Encounter (Signed)
This has been completed and I LM for the pt letting her know this is up front for p/u.

## 2014-05-22 ENCOUNTER — Encounter: Payer: Self-pay | Admitting: *Deleted

## 2014-05-22 NOTE — Telephone Encounter (Signed)
Letter written per Dr. Hoyt Koch request for surgical clearance including the use of anesthesia.  Faxed to Dr Lupita Leash office.

## 2014-11-14 ENCOUNTER — Telehealth: Payer: Self-pay

## 2014-11-14 NOTE — Telephone Encounter (Signed)
In Dr. Lenn Cal box.

## 2014-11-14 NOTE — Telephone Encounter (Signed)
Pt stopped in and dropped off a handicap paper for Dr Joseph Art to fill out. The form was placed in box at [place]. Please call pt when completed [phone] . Thank you

## 2014-11-16 ENCOUNTER — Telehealth: Payer: Self-pay

## 2014-11-16 NOTE — Telephone Encounter (Signed)
Spoke to patient and advised her that her paperwork has been completed and is ready for pickup at [facility].  She said she will pick it up later today.

## 2014-12-19 ENCOUNTER — Telehealth: Payer: Self-pay

## 2014-12-19 MED ORDER — FLUCONAZOLE 100 MG PO TABS: ORAL_TABLET | ORAL | Status: DC

## 2014-12-19 NOTE — Telephone Encounter (Signed)
PATIENT WOULD LIKE DR. Joseph Art TO KNOW THAT SHE IS HAVING A FLARE-UP OF HER THRUSH. SHE WOULD LIKE HIM TO REFILL HER DIFLUCAN 100 MG. SHE SAID HE USUALLY GIVES HER ABOUT 10 PILLS. BEST PHONE [phone] (CELL)  PHARMACY CHOICE IS [facility] ON [address].  [place]

## 2014-12-20 ENCOUNTER — Ambulatory Visit (INDEPENDENT_AMBULATORY_CARE_PROVIDER_SITE_OTHER): Payer: Medicare Other | Admitting: Family Medicine

## 2014-12-20 VITALS — BP 124/76 | HR 85 | Temp 97.5°F | Resp 18 | Ht 64.0 in | Wt 97.5 lb

## 2014-12-20 DIAGNOSIS — F329 Major depressive disorder, single episode, unspecified: Secondary | ICD-10-CM

## 2014-12-20 DIAGNOSIS — R1013 Epigastric pain: Secondary | ICD-10-CM | POA: Diagnosis not present

## 2014-12-20 DIAGNOSIS — B370 Candidal stomatitis: Secondary | ICD-10-CM | POA: Diagnosis not present

## 2014-12-20 DIAGNOSIS — F32A Depression, unspecified: Secondary | ICD-10-CM

## 2014-12-20 MED ORDER — TRAMADOL HCL 50 MG PO TABS: ORAL_TABLET | ORAL | Status: DC

## 2014-12-20 MED ORDER — FLUCONAZOLE 100 MG PO TABS: ORAL_TABLET | ORAL | Status: DC

## 2014-12-20 MED ORDER — BUPROPION HCL ER (XL) 150 MG PO TB24: 150.0000 mg | ORAL_TABLET | Freq: Every day | ORAL | Status: DC

## 2014-12-20 NOTE — Patient Instructions (Signed)
Thrush, Adult  Thrush, also called oral candidiasis, is a fungal infection that develops in the mouth and throat and on the tongue. It causes white patches to form on the mouth and tongue. Thrush is most common in older adults, but it can occur at any age.  Many cases of thrush are mild, but this infection can also be more serious. Thrush can be a recurring problem for people who have chronic illnesses or who take medicines that limit the body's ability to fight infection. Because these people have difficulty fighting infections, the fungus that causes thrush can spread throughout the body. This can cause life-threatening blood or organ infections. CAUSES  Thrush is usually caused by a yeast called Candida albicans. This fungus is normally present in small amounts in the mouth and on other mucous membranes. It usually causes no harm. However, when conditions are present that allow the fungus to grow uncontrolled, it invades surrounding tissues and becomes an infection. Less often, other Candida species can also lead to thrush.  RISK FACTORS Thrush is more likely to develop in the following people:  People with an impaired ability to fight infection (weakened immune system).   Older adults.   People with HIV.   People with diabetes.   People with dry mouth (xerostomia).   Pregnant women.   People with poor dental care, especially those who have false teeth.   People who use antibiotic medicines.  SIGNS AND SYMPTOMS  Thrush can be a mild infection that causes no symptoms. If symptoms develop, they may include:   A burning feeling in the mouth and throat. This can occur at the start of a thrush infection.   White patches that adhere to the mouth and tongue. The tissue around the patches may be red, raw, and painful. If rubbed (during tooth brushing, for example), the patches and the tissue of the mouth may bleed easily.   A bad taste in the mouth or difficulty tasting foods.    Cottony feeling in the mouth.   Pain during eating and swallowing. DIAGNOSIS  Your health care provider can usually diagnose thrush by looking in your mouth and asking you questions about your health.  TREATMENT  Medicines that help prevent the growth of fungi (antifungals) are the standard treatment for thrush. These medicines are either applied directly to the affected area (topical) or swallowed (oral). The treatment will depend on the severity of the condition.  Mild Thrush Mild cases of thrush may clear up with the use of an antifungal mouth rinse or lozenges. Treatment usually lasts about 14 days.  Moderate to Severe Thrush  More severe thrush infections that have spread to the esophagus are treated with an oral antifungal medicine. A topical antifungal medicine may also be used.   For some severe infections, a treatment period longer than 14 days may be needed.   Oral antifungal medicines are almost never used during pregnancy because the fetus may be harmed. However, if a pregnant woman has a rare, severe thrush infection that has spread to her blood, oral antifungal medicines may be used. In this case, the risk of harm to the mother and fetus from the severe thrush infection may be greater than the risk posed by the use of antifungal medicines.  Persistent or Recurrent Thrush For cases of thrush that do not go away or keep coming back, treatment may involve the following:   Treatment may be needed twice as long as the symptoms last.   Treatment will   include both oral and topical antifungal medicines.   People with weakened immune systems can take an antifungal medicine on a continuous basis to prevent thrush infections.  It is important to treat conditions that make you more likely to get thrush, such as diabetes or HIV.  HOME CARE INSTRUCTIONS   Only take over-the-counter or prescription medicine as directed by your health care provider. Talk to your health care  provider about an over-the-counter medicine called gentian violet, which kills bacteria and fungi.   Eat plain, unflavored yogurt as directed by your health care provider. Check the label to make sure the yogurt contains live cultures. This yogurt can help healthy bacteria grow in the mouth that can stop the growth of the fungus that causes thrush.   Try these measures to help reduce the discomfort of thrush:   Drink cold liquids such as water or iced tea.   Try flavored ice treats or frozen juices.   Eat foods that are easy to swallow, such as gelatin, ice cream, or custard.   If the patches in your mouth are painful, try drinking from a straw.   Rinse your mouth several times a day with a warm saltwater rinse. You can make the saltwater mixture with 1 tsp (6 g) of salt in 8 fl oz (0.2 L) of warm water.   If you wear dentures, remove the dentures before going to bed, brush them vigorously, and soak them in a cleaning solution as directed by your health care provider.   Women who are breastfeeding should clean their nipples with an antifungal medicine as directed by their health care provider. Dry the nipples after breastfeeding. Applying lanolin-containing body lotion may help relieve nipple soreness.  SEEK MEDICAL CARE IF:  Your symptoms are getting worse or are not improving within 7 days of starting treatment.   You have symptoms of spreading infection, such as white patches on the skin outside of the mouth.   You are nursing and you have redness, burning, or pain in the nipples that is not relieved with treatment.  MAKE SURE YOU:  Understand these instructions.  Will watch your condition.  Will get help right away if you are not doing well or get worse. Document Released: 05/13/2004 Document Revised: 06/08/2013 Document Reviewed: 03/21/2013 ExitCare Patient Information 2015 [employer]. This information is not intended to replace advice given to you by your  health care provider. Make sure you discuss any questions you have with your health care provider.  

## 2014-12-20 NOTE — Progress Notes (Signed)
Patient ID: Autumn Turner, female   DOB: 08-21-1961, 54 y.o.   MRN: [id number]   This chart was scribed for Robyn Haber, MD by [facility], medical scribe at [facility].The patient was seen in exam room 03 and the patient's care was started at 4:54 PM.  Patient ID: Autumn Turner MRN: [id number], DOB: [birth year], 54 y.o. Date of Encounter: 12/20/2014  Primary Physician: Robyn Haber, MD  Chief Complaint:  Chief Complaint  Patient presents with   Oral Swelling    x 3 days   HPI:  Autumn Turner is a 54 y.o. female who presents to [facility] complaining of recurrent oral swelling ongoing for 3 days. She also complains of irritation of her nose, she has associated rhinorrhea. Usually just her cheeks swell but today her the inside of her mouth is swollen and erythematous. Teeth extracted in October, she has dentures but her mouth was too swollen to have them placed in. No elevation in her blood sugars and her weight has been maintained. Pt typically weighs around 95-100 pounds. She denies numbness  Pt had an allergic reaction to Zoloft her hands broke out in a rash and would like to switch to albutein . She would also like a refill for her diflucan and tramadol.   Past Medical History  Diagnosis Date   Hypertension    Abnormal Pap smear    Vulvar lesion    CIN II (cervical intraepithelial neoplasia II)    Malnourished    Sleep apnea     Loss weight with gastric bypass   Cancer     vulvar   Anemia     blood transfusion    Home Meds: Prior to Admission medications   Medication Sig Start Date End Date Taking? Authorizing Provider  diphenhydrAMINE (BENADRYL) 12.5 MG chewable tablet Chew 12.5 mg by mouth 4 (four) times daily as needed for allergies.   Yes Historical Provider, MD  Multiple Vitamins-Minerals (MULTIVITAMIN PO) Take 1 tablet by mouth.   Yes Historical Provider, MD  traMADol (ULTRAM) 50 MG tablet TAKE 1 TABLET BY MOUTH  THREE TIMES DAILY AS NEEDED 03/03/14  Yes Chelle S Jeffery, PA-C  vitamin C (ASCORBIC ACID) 500 MG tablet Take 500 mg by mouth daily.   Yes Historical Provider, MD  cyanocobalamin (,VITAMIN B-12,) 1000 MCG/ML injection Inject 1 mL (1,000 mcg total) into the muscle every 30 (thirty) days. Patient not taking: Reported on 12/20/2014 01/19/13   Theda Sers, PA-C  fluconazole (DIFLUCAN) 100 MG tablet TAKE 1 TABLET BY MOUTH DAILY Patient not taking: Reported on 12/20/2014 12/19/14   Robyn Haber, MD  [facility] SYRINGE 3CC/25GX5/8" 25G X 5/8" 3 ML MISC USE AS DIRECTED Patient not taking: Reported on 12/20/2014 03/22/13   Areta Haber Dunn, PA-C  sertraline (ZOLOFT) 50 MG tablet Take 1 tablet (50 mg total) by mouth daily. Patient not taking: Reported on 12/20/2014 08/11/13   Robyn Haber, MD   Allergies:  Allergies  Allergen Reactions   Ciprofloxacin Other (See Comments)    Blisters, boils   Dust Mite Extract Itching   Iohexol Hives     Code: HIVES    Nsaids     Due to Hx of gastric bypass   Ciprocinonide [Fluocinolone] Hives and Rash   History   Social History   Marital Status: Married    Spouse Name: N/A   Number of Children: N/A   Years of Education: N/A   Occupational History  Not on file.   Social History Main Topics   Smoking status: Current Some Day Smoker -- 0.50 packs/day for 20 years    Types: Cigarettes   Smokeless tobacco: Never Used   Alcohol Use: No   Drug Use: No   Sexual Activity: Not on file   Other Topics Concern   Not on file   Social History Narrative    Review of Systems: Constitutional: negative for chills, fever, night sweats, weight changes, or fatigue  HEENT: negative for vision changes, hearing loss, congestion, ST, epistaxis, or sinus pressure Positive for rhinorrhea, facial swelling. Cardiovascular: negative for chest pain or palpitations Respiratory: negative for hemoptysis, wheezing, shortness of breath, or cough Abdominal: negative  for abdominal pain, nausea, vomiting, diarrhea, or constipation Dermatological: negative for rash Neurologic: negative for headache, dizziness, or syncope All other systems reviewed and are otherwise negative with the exception to those above and in the HPI.  Physical Exam: Blood pressure 124/76, pulse 85, temperature 97.5 F (36.4 C), temperature source Oral, resp. rate 18, height 5\' 4"  (1.626 m), weight 97 lb 8 oz (44.226 kg), SpO2 98 %., Body mass index is 16.73 kg/(m^2). General: Well developed, well nourished, in no acute distress. Edentulous  Head: Normocephalic, atraumatic, eyes without discharge, sclera non-icteric, nares are without discharge. Bilateral auditory canals clear, TM's are without perforation, pearly grey and translucent with reflective cone of light bilaterally. Mouth is erythematous diffusely with swelling on the buccal mucosa. Neck: Supple. No thyromegaly. Full ROM. No lymphadenopathy. Lungs: Clear bilaterally to auscultation without wheezes, rales, or rhonchi. Breathing is unlabored. Heart: RRR with S1 S2. No murmurs, rubs, or gallops appreciated. Abdomen: Soft, non-tender, non-distended with normoactive bowel sounds. No hepatomegaly. No rebound/guarding. No obvious abdominal masses. Msk:  Strength and tone normal for age. Extremities/Skin: Warm and dry. No clubbing or cyanosis. No edema. No rashes or suspicious lesions. Neuro: Alert and oriented X 3. Moves all extremities spontaneously. Gait is normal. CNII-XII grossly in tact. Psych:  Responds to questions appropriately with a normal affect.     ASSESSMENT AND PLAN:  54 y.o. year old female with what appears to be thrush, depression, and chronic intermittent abdominal pain. She also has a poorly explained problem with recurrent eye swelling and watery discharge which lasted data time. I offered to send her to a specialist for this but she declined.  This chart was scribed in my presence and reviewed by me  personally.    ICD-9-CM ICD-10-CM   1. Thrush 112.0 B37.0 fluconazole (DIFLUCAN) 100 MG tablet  2. Epigastric pain 789.06 R10.13 traMADol (ULTRAM) 50 MG tablet  3. Depression 311 F32.9 buPROPion (WELLBUTRIN XL) 150 MG 24 hr tablet    Signed, Robyn Haber, MD 12/20/2014 4:54 PM

## 2014-12-20 NOTE — Telephone Encounter (Signed)
Advised pt Rx was sent in.

## 2014-12-28 ENCOUNTER — Other Ambulatory Visit: Payer: Self-pay

## 2014-12-28 DIAGNOSIS — B370 Candidal stomatitis: Secondary | ICD-10-CM

## 2014-12-28 MED ORDER — FLUCONAZOLE 100 MG PO TABS: ORAL_TABLET | ORAL | Status: DC

## 2014-12-28 NOTE — Telephone Encounter (Signed)
Refill provided. Patient is aware.

## 2014-12-28 NOTE — Telephone Encounter (Signed)
Pt requesting refill on fluconazole (DIFLUCAN) 100 MG tablet [[id number]] , states she tried calling pharmacy, but they never send it correctly. Please advise ( states this is for thrash)

## 2015-01-03 ENCOUNTER — Telehealth: Payer: Self-pay

## 2015-01-03 NOTE — Telephone Encounter (Signed)
Pt states she was given meds for thrush and is actually getting worse, Please advise!   Best phone for pt is [place]

## 2015-01-04 NOTE — Telephone Encounter (Signed)
Please advise patient to RTC for re-evaluation.

## 2015-01-04 NOTE — Telephone Encounter (Signed)
Pt.notified

## 2015-01-05 NOTE — Telephone Encounter (Signed)
Called pt, she plans to come in this weekend to see Dr. Carlean Jews. I advised her that she should discuss her future PCP at [facility] with Dr. Carlean Jews when she comes in this weekend. She agreed, advised her we do accept her insurance and would be glad to continue her primary care.

## 2015-01-07 ENCOUNTER — Ambulatory Visit (INDEPENDENT_AMBULATORY_CARE_PROVIDER_SITE_OTHER): Payer: Medicare Other | Admitting: Family Medicine

## 2015-01-07 VITALS — BP 108/80 | HR 78 | Temp 97.3°F | Resp 18 | Ht 64.5 in | Wt 98.6 lb

## 2015-01-07 DIAGNOSIS — E538 Deficiency of other specified B group vitamins: Secondary | ICD-10-CM | POA: Diagnosis not present

## 2015-01-07 DIAGNOSIS — K1123 Chronic sialoadenitis: Secondary | ICD-10-CM

## 2015-01-07 DIAGNOSIS — R112 Nausea with vomiting, unspecified: Secondary | ICD-10-CM | POA: Diagnosis not present

## 2015-01-07 DIAGNOSIS — E559 Vitamin D deficiency, unspecified: Secondary | ICD-10-CM | POA: Diagnosis not present

## 2015-01-07 DIAGNOSIS — E46 Unspecified protein-calorie malnutrition: Secondary | ICD-10-CM

## 2015-01-07 DIAGNOSIS — R1110 Vomiting, unspecified: Secondary | ICD-10-CM

## 2015-01-07 LAB — COMPREHENSIVE METABOLIC PANEL
ALT: 13 U/L (ref 0–35)
AST: 25 U/L (ref 0–37)
Albumin: 2.6 g/dL — ABNORMAL LOW (ref 3.5–5.2)
Alkaline Phosphatase: 161 U/L — ABNORMAL HIGH (ref 39–117)
BUN: 9 mg/dL (ref 6–23)
CO2: 29 mEq/L (ref 19–32)
Calcium: 8.5 mg/dL (ref 8.4–10.5)
Chloride: 105 mEq/L (ref 96–112)
Creat: 0.55 mg/dL (ref 0.50–1.10)
Glucose, Bld: 76 mg/dL (ref 70–99)
Potassium: 3.7 mEq/L (ref 3.5–5.3)
Sodium: 142 mEq/L (ref 135–145)
Total Bilirubin: 0.2 mg/dL (ref 0.2–1.2)
Total Protein: 5 g/dL — ABNORMAL LOW (ref 6.0–8.3)

## 2015-01-07 LAB — POCT CBC
Granulocyte percent: 61.2 %G (ref 37–80)
HCT, POC: 29.6 % — AB (ref 37.7–47.9)
Hemoglobin: 9 g/dL — AB (ref 12.2–16.2)
Lymph, poc: 1.4 (ref 0.6–3.4)
MCH, POC: 25 pg — AB (ref 27–31.2)
MCHC: 30.4 g/dL — AB (ref 31.8–35.4)
MCV: 82.4 fL (ref 80–97)
MID (cbc): 0.4 (ref 0–0.9)
MPV: 8.4 fL (ref 0–99.8)
POC Granulocyte: 2.9 (ref 2–6.9)
POC LYMPH PERCENT: 29.4 %L (ref 10–50)
POC MID %: 9.4 %M (ref 0–12)
Platelet Count, POC: 307 10*3/uL (ref 142–424)
RBC: 3.59 M/uL — AB (ref 4.04–5.48)
RDW, POC: 16.7 %
WBC: 4.7 10*3/uL (ref 4.6–10.2)

## 2015-01-07 LAB — VITAMIN B12: Vitamin B-12: >2000 pg/mL — ABNORMAL HIGH (ref 211–911)

## 2015-01-07 NOTE — Progress Notes (Signed)
Subjective:    Patient ID: Autumn Turner, female    DOB: Nov 20, 1960, 54 y.o.   MRN: [id number] This chart was scribed for Autumn Haber, MD by Autumn Turner. This patient was seen in Room 8 and the patient's care was started at 1:44 PM.   HPI HPI Comments: Autumn Turner is a 54 y.o. female who presents to the [facility] for a follow-up for oral swelling that started about 3 weeks ago. Patient has not been using her dentures while on the medications I prescribed her at the last visit 4/20. The diflucan did help with nose soreness. She denies chills, diaphoresis, diarrhea, weight loss, tongue pain and any other pain in her mouth. 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 Her surgery was done by Autumn Turner in [place]; he moved, but she has been seeing Autumn Turner. She does not get nausea with the vomiting. She has been drinking meal replacement shakes. Patient notes malnourishment and muscle atrophy secondary to the surgery.  Note from last visit with me 12/20/14: Autumn Turner is a 54 y.o. female who presents to [facility] complaining of recurrent oral swelling ongoing for 3 days. She also complains of irritation of her nose, she has associated rhinorrhea. Usually just her cheeks swell but today her the inside of her mouth is swollen and erythematous. Teeth extracted in October, she has dentures but her mouth was too swollen to have them placed in. No elevation in her blood sugars and her weight has been maintained. Pt typically weighs around 95-100 pounds. She denies numbness Pt had an allergic reaction to Zoloft her hands broke out in a rash and would like to switch to albutein . She would also like a refill for her diflucan and tramadol.   Past Surgical History  Procedure Laterality Date   Cesarean section     Gastric bypass  2001   Gallbladder surgery  2002     Tummy tuck  2004   Leep  06/2009   Cholecystectomy     Tonsillectomy      age 49 adnoids   Vulvectomy Bilateral 12/07/2012    Procedure: VULVECTOMY AND BILATERAL INGUINAL LYMPH NODE DISSECTION, PAP SMEAR;  Surgeon: Autumn Morning, MD;  Location: [facility];  Service: Gynecology;  Laterality: Bilateral;     Review of Systems  Constitutional: Negative for chills, diaphoresis and unexpected weight change.  HENT: Positive for facial swelling.   Respiratory: Negative for cough.   Gastrointestinal: Positive for vomiting. Negative for nausea and diarrhea.       Objective:   Physical Exam CONSTITUTIONAL: Well developed/well nourished HEAD: Normocephalic/atraumatic EYES: EOM/PERRL ENMT: Mucous membranes moist NECK: supple no meningeal signs SPINE: entire spine nontender CV: S1/S2 noted, no murmurs/rubs/gallops noted LUNGS: Lungs are clear to auscultation bilaterally, no apparent distress ABDOMEN: soft, nontender, no rebound or guarding GU: no cva tenderness NEURO: Pt is awake/alert, moves all extremitiesx4 EXTREMITIES: pulses normal, full ROM SKIN: warm, color normal PSYCH: no abnormalities of mood noted        Assessment & Plan:   This chart was scribed in my presence and reviewed by me personally.    ICD-9-CM ICD-10-CM   1. Protein-calorie malnutrition 263.9 E46 POCT CBC     Ambulatory referral to Gastroenterology     Vit D  25 hydroxy (rtn osteoporosis monitoring)     Vitamin B12  2. Low vitamin  B12 level 266.2 E53.8 Ambulatory referral to Gastroenterology     Vit D  25 hydroxy (rtn osteoporosis monitoring)     Vitamin B12  3. Chronic vomiting 787.03 R11.2 Comprehensive metabolic panel     Ambulatory referral to Gastroenterology     Vit D  25 hydroxy (rtn osteoporosis monitoring)     Vitamin B12  4. 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 Unconcerned about her nutritional status even though she has gained 2 pounds since her last visit here. The past gastric surgery is definitely corrected her obesity problem but it has left her vulnerable to a wide Friday of other problems.  Signed, Autumn Haber, MD  Note:  Patient will follow up with Dr. Tamala Turner as I transition to urgent car only

## 2015-01-08 ENCOUNTER — Telehealth: Payer: Self-pay | Admitting: Physician Assistant

## 2015-01-08 ENCOUNTER — Telehealth: Payer: Self-pay

## 2015-01-08 DIAGNOSIS — K1123 Chronic sialoadenitis: Secondary | ICD-10-CM

## 2015-01-08 LAB — VITAMIN D 25 HYDROXY (VIT D DEFICIENCY, FRACTURES): Vit D, 25-Hydroxy: 51 ng/mL (ref 30–100)

## 2015-01-08 MED ORDER — AMOXICILLIN-POT CLAVULANATE 875-125 MG PO TABS: 1.0000 | ORAL_TABLET | Freq: Two times a day (BID) | ORAL | Status: DC

## 2015-01-08 NOTE — Telephone Encounter (Signed)
Pharamcy called and I tried to authorize this over the phone but they needed a quant. Send in another Rx in liquid form please.

## 2015-01-08 NOTE — Telephone Encounter (Addendum)
Pt called back and wanted to make sure you know that she couldn't swallow big pills.  I advised her to call the pharmacy to ask about the size and that she could break them in half if needed.

## 2015-01-08 NOTE — Telephone Encounter (Signed)
Spoke with patient and was diagnosed with chronic parotitis yesterday by Dr. Carlean Jews. Saw dentist today when taking her husband for appt and recommended due to her other oral conditions should be put on antibiotics.   Recommendations for parotitis are Clinda or Augmenting. Will send Augmentin to pharmacy.

## 2015-01-08 NOTE — Telephone Encounter (Signed)
Lvamil that spoke with Dr. Carlean Jews and parotitis is secondary to continuous vomiting and is an inflammatory process not infectious. Antibiotics are not needed.

## 2015-01-08 NOTE — Telephone Encounter (Signed)
Pt states she went to dentist today and was diagnosed with periodontal disease,will require an antibiotic called in Before treatment    Best phone for pt is [address]

## 2015-01-08 NOTE — Telephone Encounter (Signed)
Can we call in abx?

## 2015-01-08 NOTE — Telephone Encounter (Deleted)
Pt wa

## 2015-01-24 ENCOUNTER — Telehealth: Payer: Self-pay

## 2015-01-24 NOTE — Telephone Encounter (Signed)
Pt called back and wanted to add also that she wants to have a referral to dr Sharyn Creamer whelan with [facility]

## 2015-01-24 NOTE — Telephone Encounter (Signed)
Pt is wanting to know if change the welbutrin from 150 to 300mg  and also refill diflucan and tramedal  Best number [phone]

## 2015-01-25 ENCOUNTER — Other Ambulatory Visit: Payer: Self-pay | Admitting: Family Medicine

## 2015-01-25 DIAGNOSIS — R1013 Epigastric pain: Secondary | ICD-10-CM

## 2015-01-25 DIAGNOSIS — B370 Candidal stomatitis: Secondary | ICD-10-CM

## 2015-01-25 DIAGNOSIS — J309 Allergic rhinitis, unspecified: Secondary | ICD-10-CM

## 2015-01-25 MED ORDER — BUPROPION HCL ER (XL) 300 MG PO TB24: 300.0000 mg | ORAL_TABLET | Freq: Every day | ORAL | Status: DC

## 2015-01-25 MED ORDER — TRAMADOL HCL 50 MG PO TABS: ORAL_TABLET | ORAL | Status: AC

## 2015-01-25 MED ORDER — FLUCONAZOLE 100 MG PO TABS: ORAL_TABLET | ORAL | Status: DC

## 2015-01-25 MED ORDER — FLUCONAZOLE 100 MG PO TABS
ORAL_TABLET | ORAL | Status: DC
Start: 2015-01-25 — End: 2015-04-18

## 2015-02-07 ENCOUNTER — Encounter: Payer: Self-pay | Admitting: *Deleted

## 2015-04-18 ENCOUNTER — Ambulatory Visit (INDEPENDENT_AMBULATORY_CARE_PROVIDER_SITE_OTHER): Payer: Medicare Other | Admitting: Emergency Medicine

## 2015-04-18 ENCOUNTER — Ambulatory Visit (INDEPENDENT_AMBULATORY_CARE_PROVIDER_SITE_OTHER): Payer: Medicare Other

## 2015-04-18 DIAGNOSIS — S8011XA Contusion of right lower leg, initial encounter: Secondary | ICD-10-CM

## 2015-04-18 DIAGNOSIS — M79604 Pain in right leg: Secondary | ICD-10-CM

## 2015-04-18 DIAGNOSIS — B370 Candidal stomatitis: Secondary | ICD-10-CM

## 2015-04-18 DIAGNOSIS — R220 Localized swelling, mass and lump, head: Secondary | ICD-10-CM | POA: Diagnosis not present

## 2015-04-18 DIAGNOSIS — R112 Nausea with vomiting, unspecified: Secondary | ICD-10-CM

## 2015-04-18 DIAGNOSIS — R1110 Vomiting, unspecified: Secondary | ICD-10-CM

## 2015-04-18 DIAGNOSIS — E46 Unspecified protein-calorie malnutrition: Secondary | ICD-10-CM | POA: Diagnosis not present

## 2015-04-18 MED ORDER — FLUCONAZOLE 150 MG PO TABS: 150.0000 mg | ORAL_TABLET | Freq: Once | ORAL | Status: DC

## 2015-04-18 MED ORDER — FLUCONAZOLE 100 MG PO TABS: ORAL_TABLET | ORAL | Status: DC

## 2015-04-18 MED ORDER — CLOTRIMAZOLE 10 MG MT TROC: 10.0000 mg | Freq: Every day | OROMUCOSAL | Status: DC

## 2015-04-18 NOTE — Patient Instructions (Addendum)
Oral swelling likely 2/2 parotid gland swelling/irritation from chronic vomiting F/u with GI  Mycostatin lozenge for additional help, 1 time dose fluconazole Keep hematoma mildly compressed with ace wrap

## 2015-04-18 NOTE — Progress Notes (Signed)
   Subjective:    Patient ID: Autumn Turner, female    DOB: [birth year], 54 y.o.   MRN: [id number]  HPI    Review of Systems     Objective:   Physical Exam        Assessment & Plan:

## 2015-04-18 NOTE — Progress Notes (Signed)
Subjective:    Patient ID: Autumn Turner, female    DOB: 07-30-1961, 54 y.o.   MRN: [id number]  HPI    Review of Systems     Objective:   Physical Exam        Assessment & Plan:   [facility] [address], [place] [address] [phone]  Date:  04/18/2015   Name:  Autumn Turner   DOB:  03/07/1961   MRN:  [id number]  PCP:  Robyn Haber, MD    Chief Complaint: No chief complaint on file.   History of Present Illness:  Autumn Turner is a 54 y.o. 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 Amedeo Plenty)    Patient Active Problem List   Diagnosis Date Noted  . 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 Cellulitis of groin, right 12/16/2012  . 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 Hypertension   . Abnormal Pap  smear   . Vulvar lesion   . CIN II (cervical intraepithelial neoplasia II)   . Malnourished   . Sleep apnea     Loss weight with gastric bypass  . Cancer     vulvar  . Anemia     blood transfusion    Past Surgical History  Procedure Laterality Date  . Cesarean section    . Gastric bypass  2001  . Gallbladder surgery  2002  . Tummy tuck  2004  . Leep  06/2009  . Cholecystectomy    . Tonsillectomy      age 64 adnoids  . Vulvectomy Bilateral 12/07/2012    Procedure: VULVECTOMY AND BILATERAL INGUINAL LYMPH NODE DISSECTION, PAP SMEAR;  Surgeon: Janie Morning, MD;  Location: [facility];  Service: Gynecology;  Laterality: Bilateral;    Social History  Substance Use Topics  . Smoking status: Current Some Day Smoker -- 0.50 packs/day for 20 years    Types: Cigarettes  . Smokeless tobacco: Never Used  . Alcohol Use: No    Family History  Problem Relation Age of Onset  . Cancer Father   . Hypertension Father   . Lung cancer Father     Allergies  Allergen Reactions  . Ciprofloxacin Other (See Comments)    Blisters, boils  . Dust Mite Extract Itching  .  Iohexol Hives     Code: HIVES   . Nsaids     Due to Hx of gastric bypass  . Ciprocinonide [Fluocinolone] Hives and Rash    Medication list has been reviewed and updated.  Current Outpatient Prescriptions on File Prior to Visit  Medication Sig Dispense Refill  . buPROPion (WELLBUTRIN XL) 300 MG 24 hr tablet Take 1 tablet (300 mg total) by mouth daily. 90 tablet 1  . cholecalciferol (VITAMIN D) 1000 UNITS tablet Take 1,000 Units by mouth daily.    . diphenhydrAMINE (BENADRYL) 12.5 MG chewable tablet Chew 12.5 mg by mouth 4 (four) times daily as needed for allergies.    . fluconazole (DIFLUCAN) 100 MG tablet TAKE 1 TABLET BY MOUTH DAILY 10 tablet 0  . Multiple Vitamins-Minerals (MULTIVITAMIN PO) Take 1 tablet by mouth.    . traMADol (ULTRAM) 50 MG tablet TAKE 1 TABLET BY MOUTH THREE TIMES DAILY AS NEEDED 60 tablet 0  .  vitamin B-12 (CYANOCOBALAMIN) 1000 MCG tablet Take 1,000 mcg by mouth daily.    . vitamin C (ASCORBIC ACID) 500 MG tablet Take 500 mg by mouth daily.     Current Facility-Administered Medications on File Prior to Visit  Medication Dose Route Frequency Provider Last Rate Last Dose  . cyanocobalamin ((VITAMIN B-12)) injection 1,000 mcg  1,000 mcg Intramuscular Weekly Robyn Haber, MD        Review of Systems:  Gen: no fevers, night sweats, + weight loss 2/2 constant vomiting and calorie malnutrition HEENT: no cough, congestion, rhinorrhea, ear pain, + facial swelling Cardio: denies CP, plapitations, tachycardia, bradycardia Pulm: denies SOB, SOB on exertion, accessory muscle use  GI: denies abd pain,+ N/V, denies diarrhea, GU: denies  Dysuria, hematuria Musculo: +RLE pain, swelling and bruising,denies sensation loss, endorses nml motor function able to ambulate Neuro: denies focal deficits, denies new onset numbness or tingling Integ: skin lesion/bruising RLE    Physical Examination: There were no vitals filed for this visit. There were no vitals filed for this visit. There is no weight on file to calculate BMI. Ideal Body Weight:   CONSTITUTIONAL: Well developed, poorly nourished, alert and oriented, no acute distress HEAD: Normocephalic/atraumatic EYES: EOM/PERRL ENMT: Mucous membranes moist, mild oral swelling in cheeks, no obvious thrush on exam CV: S1/S2 noted, no murmurs/rubs/gallops noted LUNGS: Lungs are clear to auscultation bilaterally, no apparent distress ABDOMEN: soft, nontender, no rebound or guarding NEURO: Pt is awake/alert, moves all extremitiesx4 EXTREMITIES: pulses normal, full ROM, able to ambulate, large hematoma in medial portion of right LE with fluctuance , no induration, no warmth or associated redness, TTP of RLE SKIN: warm, color normal except as noted on RLE as above PSYCH: no abnormalities of mood noted  [facility] provider interpretation: no evidence of  acute fx by Dr. Everlene Farrier  Assessment and Plan: Lower extremity pain, right - Plan: DG Tibia/Fibula Right  Hematoma of lower extremity, right, initial encounter - Plan: DG Tibia/Fibula Right  Chronic vomiting  Mouth swelling  Protein calorie malnutrition  Oral thrush  Pt with intermittent thrush likely 2/2 to constant vomiting and previous hx of CA, no current evidence of thrush on exam but will refill previous rx for diflucan one dose prophalactically and mycocele for topical use Oral swelling likely 2/2 to constant vomiting causing bilateral parotid swelling and irritation Pt to follow up with GI this month to address chronic vomiting inability to tolerate solid food Recommended against hematoma drainage as pt requested as this would just introduce infxn, will do mild  compression with ace wrap, no evidence of fx on xray Will continue vitamin supplementation and pureed food until complete plan established with gastroenterology on follow up    [place], DO

## 2015-06-04 ENCOUNTER — Telehealth: Payer: Self-pay

## 2015-06-04 NOTE — Telephone Encounter (Signed)
Pt requesting a refill on her DIFLUCAN 150 MG. Please call [facility]

## 2015-06-04 NOTE — Telephone Encounter (Signed)
Pt notified to RTC 

## 2015-06-04 NOTE — Telephone Encounter (Signed)
Please advise on refill.

## 2015-06-04 NOTE — Telephone Encounter (Signed)
Patient should rtc for re-evaluation since she has not been here since 04/2015. Also, she did not have clear cut oral thrush at her last visit so please have her come back.

## 2015-06-07 ENCOUNTER — Other Ambulatory Visit: Payer: Self-pay | Admitting: Gastroenterology

## 2015-06-10 NOTE — Addendum Note (Signed)
Addended byClarene Essex on: 06/10/2015 07:40 PM   Modules accepted: Orders

## 2015-06-14 ENCOUNTER — Other Ambulatory Visit: Payer: Self-pay | Admitting: Gastroenterology

## 2015-06-14 NOTE — Addendum Note (Signed)
Addended byClarene Essex on: 06/14/2015 10:58 AM   Modules accepted: Orders

## 2015-06-28 ENCOUNTER — Encounter (HOSPITAL_COMMUNITY): Payer: Self-pay | Admitting: *Deleted

## 2015-06-28 NOTE — Progress Notes (Signed)
Pt denies cardiac history, chest pain or sob. No prior cardiac studies.

## 2015-06-29 ENCOUNTER — Ambulatory Visit (HOSPITAL_COMMUNITY)
Admission: RE | Admit: 2015-06-29 | Discharge: 2015-06-29 | Disposition: A | Discharge status: Home / Self Care | Payer: Medicare Other | Source: Ambulatory Visit | Attending: Gastroenterology | Admitting: Gastroenterology

## 2015-06-29 ENCOUNTER — Ambulatory Visit (HOSPITAL_COMMUNITY): Payer: Medicare Other | Admitting: Anesthesiology

## 2015-06-29 ENCOUNTER — Encounter (HOSPITAL_COMMUNITY)
Admission: RE | Disposition: A | Discharge status: Home / Self Care | Payer: Self-pay | Source: Ambulatory Visit | Attending: Gastroenterology

## 2015-06-29 ENCOUNTER — Encounter (HOSPITAL_COMMUNITY): Payer: Self-pay

## 2015-06-29 DIAGNOSIS — R112 Nausea with vomiting, unspecified: Secondary | ICD-10-CM | POA: Diagnosis not present

## 2015-06-29 DIAGNOSIS — E538 Deficiency of other specified B group vitamins: Secondary | ICD-10-CM | POA: Insufficient documentation

## 2015-06-29 DIAGNOSIS — G4730 Sleep apnea, unspecified: Secondary | ICD-10-CM | POA: Diagnosis not present

## 2015-06-29 DIAGNOSIS — F17200 Nicotine dependence, unspecified, uncomplicated: Secondary | ICD-10-CM | POA: Insufficient documentation

## 2015-06-29 DIAGNOSIS — I10 Essential (primary) hypertension: Secondary | ICD-10-CM | POA: Insufficient documentation

## 2015-06-29 DIAGNOSIS — K449 Diaphragmatic hernia without obstruction or gangrene: Secondary | ICD-10-CM | POA: Diagnosis not present

## 2015-06-29 DIAGNOSIS — E559 Vitamin D deficiency, unspecified: Secondary | ICD-10-CM | POA: Insufficient documentation

## 2015-06-29 DIAGNOSIS — Z79899 Other long term (current) drug therapy: Secondary | ICD-10-CM | POA: Diagnosis not present

## 2015-06-29 DIAGNOSIS — K311 Adult hypertrophic pyloric stenosis: Secondary | ICD-10-CM | POA: Insufficient documentation

## 2015-06-29 DIAGNOSIS — Z9884 Bariatric surgery status: Secondary | ICD-10-CM | POA: Diagnosis not present

## 2015-06-29 HISTORY — DX: Major depressive disorder, single episode, unspecified: F32.9

## 2015-06-29 HISTORY — DX: Anxiety disorder, unspecified: F41.9

## 2015-06-29 HISTORY — DX: Depression, unspecified: F32.A

## 2015-06-29 HISTORY — DX: Personal history of other diseases of the digestive system: Z87.19

## 2015-06-29 HISTORY — PX: BALLOON DILATION: SHX5330

## 2015-06-29 HISTORY — DX: Personal history of peptic ulcer disease: Z87.11

## 2015-06-29 HISTORY — PX: ESOPHAGOGASTRODUODENOSCOPY: SHX5428

## 2015-06-29 SURGERY — EGD (ESOPHAGOGASTRODUODENOSCOPY)
Anesthesia: Monitor Anesthesia Care

## 2015-06-29 MED ORDER — LIDOCAINE HCL (CARDIAC) 20 MG/ML IV SOLN
INTRAVENOUS | Status: DC | PRN
  Administered 2015-06-29: 50 mg via INTRAVENOUS

## 2015-06-29 MED ORDER — SODIUM CHLORIDE 0.9 % IV SOLN: INTRAVENOUS | Status: DC

## 2015-06-29 MED ORDER — SODIUM CHLORIDE 0.9 % IV SOLN
INTRAVENOUS | Status: DC
  Administered 2015-06-29: 09:00:00 via INTRAVENOUS

## 2015-06-29 MED ORDER — BUTAMBEN-TETRACAINE-BENZOCAINE 2-2-14 % EX AERO
INHALATION_SPRAY | CUTANEOUS | Status: DC | PRN
  Administered 2015-06-29: 2 via TOPICAL

## 2015-06-29 MED ORDER — MIDAZOLAM HCL 5 MG/5ML IJ SOLN
INTRAMUSCULAR | Status: DC | PRN
  Administered 2015-06-29: 2 mg via INTRAVENOUS

## 2015-06-29 MED ORDER — PROPOFOL 10 MG/ML IV BOLUS
INTRAVENOUS | Status: DC | PRN
  Administered 2015-06-29 (×7): 30 mg via INTRAVENOUS
  Administered 2015-06-29: 40 mg via INTRAVENOUS
  Administered 2015-06-29 (×3): 30 mg via INTRAVENOUS

## 2015-06-29 NOTE — Progress Notes (Signed)
Autumn Turner 8:49 AM  Subjective: Patient without any new complaints since I saw her recently in the office in her case and procedure was discussed with her husband as well  Objective: Vital signs stable afebrile no acute distress exam please see preassessment evaluation  Assessment: Anastomotic stricture  Plan: Okay to proceed with endoscopy with probable balloon dilation with anesthesia assistance  [facility]  Pager [phone] After 5PM or if no answer call [phone]

## 2015-06-29 NOTE — Op Note (Signed)
[facility] [place], [address]   ENDOSCOPY PROCEDURE REPORT  PATIENT: Autumn, Turner  MR#: [id number] BIRTHDATE: 1960-10-12 , 34  yrs. old GENDER: female ENDOSCOPIST: Clarene Essex, MD REFERRED BY:  Robyn Haber, M.D. PROCEDURE DATE:  07/21/2015 PROCEDURE:  EGD w/ transendoscopic intraluminal balloon dilation ASA CLASS:     Class III INDICATIONS:  vomiting and nausea. secondary to gastric outlet obstruction from anastomotic stricture MEDICATIONS: Propofol 400 mg IV, Lidocaine 50 mg IV, and Versed 2 mg IV TOPICAL ANESTHETIC: Cetacaine Spray  DESCRIPTION OF PROCEDURE: After the risks benefits and alternatives of the procedure were thoroughly explained, informed consent was obtained.  The Pentax Gastroscope [id number] endoscope was introduced through the mouth and advanced to the proximal jejunum , Without limitations.  The instrument was slowly withdrawn as the mucosa was fully examined. Estimated blood loss is zero unless otherwise noted in this procedure report.    the findings are recorded below       Retroflexed views revealed a hiatal hernia.     The scope was then withdrawn from the patient and the procedure completed.  COMPLICATIONS: There were no immediate complications.  ENDOSCOPIC IMPRESSION: 1. Small hiatal hernia 2. Tiny gastric anastomotic lumen status post balloon dilation using wire-guided balloons 5 cm long 04/09/09 12 and 14 mm      all used in the customary fashion and was able to pass the scope after the 12 mm dilator 3. Few small gastric remnant ulcers 4. Friable anastomosis with small staple status post removal with biopsy forceps 5. Otherwise normal small bowel limb  RECOMMENDATIONS: observe for delayed complications clear liquids only today will discuss pured diet and increased ensure etc. consider Carafate and ask her to call me in 1 week with an update and decide further workup and plans at that pointand no  aspirin or nonsteroidals long-term  REPEAT EXAM: as needed  eSigned:  Clarene Essex, MD July 21, 2015 9:56 AM    CC:  CPT CODES: ICD CODES:  The ICD and CPT codes recommended by this software are interpretations from the data that the clinical staff has captured with the software.  The verification of the translation of this report to the ICD and CPT codes and modifiers is the sole responsibility of the health care institution and practicing physician where this report was generated.  [employer] will not be held responsible for the validity of the ICD and CPT codes included on this report.  AMA assumes no liability for data contained or not contained herein. CPT is a Designer, television/film set of the [employer].  PATIENT NAME:  Autumn, Turner MR#: [id number]

## 2015-06-29 NOTE — Anesthesia Preprocedure Evaluation (Signed)
Anesthesia Evaluation  Patient identified by MRN, date of birth, ID band Patient awake    Reviewed: Allergy & Precautions, NPO status , Patient's Chart, lab work & pertinent test results  Airway Mallampati: II  TM Distance: >3 FB Neck ROM: Full    Dental   Pulmonary sleep apnea , Current Smoker,    breath sounds clear to auscultation       Cardiovascular hypertension,  Rhythm:Regular Rate:Normal     Neuro/Psych    GI/Hepatic Neg liver ROS, GI history noted. CE   Endo/Other  negative endocrine ROS  Renal/GU negative Renal ROS     Musculoskeletal   Abdominal   Peds  Hematology negative hematology ROS (+)   Anesthesia Other Findings   Reproductive/Obstetrics                             Anesthesia Physical Anesthesia Plan  ASA: III  Anesthesia Plan: MAC   Post-op Pain Management:    Induction: Intravenous  Airway Management Planned:   Additional Equipment:   Intra-op Plan:   Post-operative Plan:   Informed Consent: I have reviewed the patients History and Physical, chart, labs and discussed the procedure including the risks, benefits and alternatives for the proposed anesthesia with the patient or authorized representative who has indicated his/her understanding and acceptance.   Dental advisory given  Plan Discussed with: CRNA and Anesthesiologist  Anesthesia Plan Comments:         Anesthesia Quick Evaluation

## 2015-06-29 NOTE — Transfer of Care (Signed)
Immediate Anesthesia Transfer of Care Note  Patient: Autumn Turner  Procedure(s) Performed: Procedure(s): ESOPHAGOGASTRODUODENOSCOPY (EGD) (N/A) BALLOON DILATION (N/A)  Patient Location: PACU  Anesthesia Type:MAC  Level of Consciousness: awake, alert  and oriented  Airway & Oxygen Therapy: Patient Spontanous Breathing and Patient connected to nasal cannula oxygen  Post-op Assessment: Report given to RN and Post -op Vital signs reviewed and stable  Post vital signs: Reviewed and stable  Last Vitals:  Filed Vitals:   06/29/15 0751  BP: 161/96  Pulse: 66  Temp: 36.7 C  Resp: 16    Complications: No apparent anesthesia complications

## 2015-06-29 NOTE — Anesthesia Postprocedure Evaluation (Signed)
  Anesthesia Post-op Note  Patient: Autumn Turner  Procedure(s) Performed: Procedure(s): ESOPHAGOGASTRODUODENOSCOPY (EGD) (N/A) BALLOON DILATION (N/A)  Patient Location: Endoscopy Unit  Anesthesia Type:MAC  Level of Consciousness: awake  Airway and Oxygen Therapy: Patient Spontanous Breathing  Post-op Pain: mild  Post-op Assessment: Post-op Vital signs reviewed              Post-op Vital Signs: Reviewed  Last Vitals:  Filed Vitals:   06/29/15 0955  BP: 161/89  Pulse: 72  Temp:   Resp: 17    Complications: No apparent anesthesia complications

## 2015-06-29 NOTE — Discharge Instructions (Signed)
Call if question or problem otherwise clear liquid diet until 5 PM today and then pured and full liquids and chew food well going forward and we will call in a prescription for Carafate to take 3 or 4 times a day and call me in 1 week with an update to decide how to proceedEsophagogastroduodenoscopy, Care After Refer to this sheet in the next few weeks. These instructions provide you with information about caring for yourself after your procedure. Your health care provider may also give you more specific instructions. Your treatment has been planned according to current medical practices, but problems sometimes occur. Call your health care provider if you have any problems or questions after your procedure. WHAT TO EXPECT AFTER THE PROCEDURE After your procedure, it is typical to feel:  Soreness in your throat.  Pain with swallowing.  Sick to your stomach (nauseous).  Bloated.  Dizzy.  Fatigued. HOME CARE INSTRUCTIONS  Do not eat or drink anything until the numbing medicine (local anesthetic) has worn off and your gag reflex has returned. You will know that the local anesthetic has worn off when you can swallow comfortably.  Do not drive or operate machinery until directed by your health care provider.  Take medicines only as directed by your health care provider. SEEK MEDICAL CARE IF:   You cannot stop coughing.  You are not urinating at all or less than usual. SEEK IMMEDIATE MEDICAL CARE IF:  You have difficulty swallowing.  You cannot eat or drink.  You have worsening throat or chest pain.  You have dizziness or lightheadedness or you faint.  You have nausea or vomiting.  You have chills.  You have a fever.  You have severe abdominal pain.  You have black, tarry, or bloody stools.   This information is not intended to replace advice given to you by your health care provider. Make sure you discuss any questions you have with your health care provider.   Document  Released: 08/04/2012 Document Revised: 09/08/2014 Document Reviewed: 08/04/2012 Elsevier Interactive Patient Education [employer].

## 2015-07-02 ENCOUNTER — Encounter (HOSPITAL_COMMUNITY): Payer: Self-pay | Admitting: Gastroenterology

## 2015-07-16 ENCOUNTER — Encounter (HOSPITAL_COMMUNITY): Payer: Self-pay | Admitting: *Deleted

## 2015-07-16 ENCOUNTER — Other Ambulatory Visit: Payer: Self-pay | Admitting: Gastroenterology

## 2015-07-16 NOTE — Progress Notes (Signed)
Pt states nothing has changed with her medical/surgical history, meds or allergies since she was here 2 weeks ago. She does request that Thedore Mins, CRNA be the one that starts her IV and draw any lab work. Request has been put in to the CRNA office.

## 2015-07-16 NOTE — Addendum Note (Signed)
Addended byClarene Essex on: 07/16/2015 02:53 PM   Modules accepted: Orders

## 2015-07-17 ENCOUNTER — Encounter (HOSPITAL_COMMUNITY)
Admission: RE | Disposition: A | Discharge status: Home / Self Care | Payer: Self-pay | Source: Ambulatory Visit | Attending: Gastroenterology

## 2015-07-17 ENCOUNTER — Encounter (HOSPITAL_COMMUNITY): Payer: Self-pay | Admitting: *Deleted

## 2015-07-17 ENCOUNTER — Ambulatory Visit (HOSPITAL_COMMUNITY): Payer: Medicare Other

## 2015-07-17 ENCOUNTER — Ambulatory Visit (HOSPITAL_COMMUNITY): Payer: Medicare Other | Admitting: Anesthesiology

## 2015-07-17 ENCOUNTER — Ambulatory Visit (HOSPITAL_COMMUNITY)
Admission: RE | Admit: 2015-07-17 | Discharge: 2015-07-17 | Disposition: A | Discharge status: Home / Self Care | Payer: Medicare Other | Source: Ambulatory Visit | Attending: Gastroenterology | Admitting: Gastroenterology

## 2015-07-17 DIAGNOSIS — R112 Nausea with vomiting, unspecified: Secondary | ICD-10-CM | POA: Diagnosis present

## 2015-07-17 DIAGNOSIS — F17200 Nicotine dependence, unspecified, uncomplicated: Secondary | ICD-10-CM | POA: Insufficient documentation

## 2015-07-17 DIAGNOSIS — K2210 Ulcer of esophagus without bleeding: Secondary | ICD-10-CM | POA: Diagnosis not present

## 2015-07-17 DIAGNOSIS — I10 Essential (primary) hypertension: Secondary | ICD-10-CM | POA: Insufficient documentation

## 2015-07-17 DIAGNOSIS — K449 Diaphragmatic hernia without obstruction or gangrene: Secondary | ICD-10-CM | POA: Diagnosis not present

## 2015-07-17 DIAGNOSIS — F418 Other specified anxiety disorders: Secondary | ICD-10-CM | POA: Diagnosis not present

## 2015-07-17 DIAGNOSIS — K222 Esophageal obstruction: Secondary | ICD-10-CM | POA: Insufficient documentation

## 2015-07-17 DIAGNOSIS — R1310 Dysphagia, unspecified: Secondary | ICD-10-CM

## 2015-07-17 HISTORY — PX: BALLOON DILATION: SHX5330

## 2015-07-17 HISTORY — PX: ESOPHAGEAL STENT PLACEMENT: SHX5540

## 2015-07-17 HISTORY — PX: ESOPHAGOGASTRODUODENOSCOPY (EGD) WITH PROPOFOL: SHX5813

## 2015-07-17 SURGERY — ESOPHAGOGASTRODUODENOSCOPY (EGD) WITH PROPOFOL
Anesthesia: Monitor Anesthesia Care

## 2015-07-17 MED ORDER — LACTATED RINGERS IV SOLN
INTRAVENOUS | Status: DC
  Administered 2015-07-17: 1000 mL via INTRAVENOUS

## 2015-07-17 MED ORDER — MIDAZOLAM HCL 5 MG/5ML IJ SOLN
INTRAMUSCULAR | Status: DC | PRN
  Administered 2015-07-17: 2 mg via INTRAVENOUS

## 2015-07-17 MED ORDER — LIDOCAINE HCL (CARDIAC) 20 MG/ML IV SOLN
INTRAVENOUS | Status: DC | PRN
  Administered 2015-07-17: 100 mg via INTRAVENOUS

## 2015-07-17 MED ORDER — HYDRALAZINE HCL 20 MG/ML IJ SOLN
5.0000 mg | Freq: Once | INTRAMUSCULAR | Status: AC
  Administered 2015-07-17: 5 mg via INTRAVENOUS

## 2015-07-17 MED ORDER — SODIUM CHLORIDE 0.9 % IV SOLN: INTRAVENOUS | Status: DC

## 2015-07-17 MED ORDER — FENTANYL CITRATE (PF) 100 MCG/2ML IJ SOLN: 25.0000 ug | INTRAMUSCULAR | Status: DC | PRN

## 2015-07-17 MED ORDER — BUTAMBEN-TETRACAINE-BENZOCAINE 2-2-14 % EX AERO
INHALATION_SPRAY | CUTANEOUS | Status: DC | PRN
  Administered 2015-07-17: 1 via TOPICAL

## 2015-07-17 MED ORDER — HYDRALAZINE HCL 20 MG/ML IJ SOLN
INTRAMUSCULAR | Status: AC
  Filled 2015-07-17: qty 1

## 2015-07-17 MED ORDER — PROPOFOL 10 MG/ML IV BOLUS
INTRAVENOUS | Status: DC | PRN
  Administered 2015-07-17 (×2): 30 mg via INTRAVENOUS
  Administered 2015-07-17: 50 mg via INTRAVENOUS
  Administered 2015-07-17 (×2): 30 mg via INTRAVENOUS
  Administered 2015-07-17: 20 mg via INTRAVENOUS
  Administered 2015-07-17 (×5): 30 mg via INTRAVENOUS

## 2015-07-17 SURGICAL SUPPLY — 1 items: esophageal wall flex ×2 IMPLANT

## 2015-07-17 NOTE — Transfer of Care (Signed)
Immediate Anesthesia Transfer of Care Note  Patient: Autumn Turner  Procedure(s) Performed: Procedure(s): ESOPHAGOGASTRODUODENOSCOPY (EGD) WITH PROPOFOL (N/A) BALLOON DILATION (N/A) ESOPHAGEAL STENT PLACEMENT (N/A)  Patient Location: Endoscopy Unit  Anesthesia Type:MAC  Level of Consciousness: awake  Airway & Oxygen Therapy: Patient Spontanous Breathing and Patient connected to nasal cannula oxygen  Post-op Assessment: Report given to RN  Post vital signs: Reviewed and stable  Last Vitals:  Filed Vitals:   07/17/15 1438  BP: 208/93  Pulse:   Temp:   Resp: 14    Complications: No apparent anesthesia complications

## 2015-07-17 NOTE — Anesthesia Postprocedure Evaluation (Signed)
  Anesthesia Post-op Note  Patient: Autumn Turner  Procedure(s) Performed: Procedure(s): ESOPHAGOGASTRODUODENOSCOPY (EGD) WITH PROPOFOL (N/A) BALLOON DILATION (N/A) ESOPHAGEAL STENT PLACEMENT (N/A)  Patient Location: PACU  Anesthesia Type: MAC  Level of Consciousness: awake and alert   Airway and Oxygen Therapy: Patient Spontanous Breathing  Post-op Pain: Controlled  Post-op Assessment: Post-op Vital signs reviewed, Patient's Cardiovascular Status Stable and Respiratory Function Stable  Post-op Vital Signs: Reviewed  Filed Vitals:   07/17/15 1519  BP: 169/92  Pulse: 74  Temp:   Resp: 13    Complications: No apparent anesthesia complications

## 2015-07-17 NOTE — Op Note (Addendum)
[facility] [place], [address]   ENDOSCOPY PROCEDURE REPORT  PATIENT: Autumn Turner, Autumn Turner  MR#: [id number] BIRTHDATE: [birth year] , 70  yrs. old GENDER: female ENDOSCOPIST: Clarene Essex, MD REFERRED BY:  Robyn Haber, M.D. PROCEDURE DATE:  07/17/2015 PROCEDURE:  EGD w/ balloon dilation And anastomotic stenting ASA CLASS:     Class III INDICATIONS:  vomiting and nausea. MEDICATIONS: Propofol 300 mg IV and Versed 2 mg IV    lidocaine 80 mg IV TOPICAL ANESTHETIC: Cetacaine Spray  DESCRIPTION OF PROCEDURE: After the risks benefits and alternatives of the procedure were thoroughly explained, informed consent was obtained.  The Pentax Gastroscope [id number] endoscope was introduced through the mouth and advanced to the proximal jejunum , Without limitations after balloon dilation of the anastomosis as below. The instrument was slowly withdrawn as the mucosa was fully examined. Estimated blood loss is zero unless otherwise noted in this procedure report.    the findings are recorded below       Retroflexion was not performed.     The scope was then withdrawn from the patient and the procedure completed.  COMPLICATIONS: There were no immediate complications.  ENDOSCOPIC IMPRESSION: 1. Small hiatal hernia 2. Unchanged anastomotic stricture with 2 ulcerations status post balloon dilation using wire-guided TTS 12, 13.5 mm balloons and then able to pass scope into normal small bowel a fairly far way   3. Long Jagwire placed into small bowel and scope removed and then replaced after stent introducer placed under fluoroscopy and stent released under fluoroscopy and endoscopic guidance using the 10 cm 18 mm covered removable stent   RECOMMENDATIONS: observe for delayed complications slowly advance diet and follow-up when necessary or in 2 weeks in the office  REPEAT EXAM: as needed or if she does well stent removal in 3 months  eSigned:  Clarene Essex, MD 07/17/2015 2:43 PM Revised: 07/17/2015 2:43 PM   [id number] [place], MD  CPT CODES: ICD CODES:  The ICD and CPT codes recommended by this software are interpretations from the data that the clinical staff has captured with the software.  The verification of the translation of this report to the ICD and CPT codes and modifiers is the sole responsibility of the health care institution and practicing physician where this report was generated.  [employer] will not be held responsible for the validity of the ICD and CPT codes included on this report.  AMA assumes no liability for data contained or not contained herein. CPT is a Designer, television/film set of the [employer].  PATIENT NAME:  Donnielle, Yeck MR#: [id number]

## 2015-07-17 NOTE — Progress Notes (Signed)
Patient's blood pressure in recovery was 208/93 and complained of a pounding headache. Dr. Jefm Petty was notified and stated to treat with 5mg  of hydralazine. Patient's following pressure after hydralazine given was 186/98.

## 2015-07-17 NOTE — Anesthesia Preprocedure Evaluation (Signed)
Anesthesia Evaluation  Patient identified by MRN, date of birth, ID band Patient awake    Reviewed: Allergy & Precautions, H&P , NPO status , Patient's Chart, lab work & pertinent test results  Airway Mallampati: III  TM Distance: >3 FB Neck ROM: Full    Dental no notable dental hx. (+) Edentulous Upper, Edentulous Lower, Dental Advisory Given   Pulmonary neg pulmonary ROS, Current Smoker,    Pulmonary exam normal breath sounds clear to auscultation       Cardiovascular hypertension, negative cardio ROS   Rhythm:Regular Rate:Normal     Neuro/Psych Anxiety Depression negative neurological ROS  negative psych ROS   GI/Hepatic negative GI ROS, Neg liver ROS,   Endo/Other  negative endocrine ROS  Renal/GU negative Renal ROS  negative genitourinary   Musculoskeletal   Abdominal   Peds  Hematology negative hematology ROS (+)   Anesthesia Other Findings   Reproductive/Obstetrics negative OB ROS                             Anesthesia Physical Anesthesia Plan  ASA: II  Anesthesia Plan: MAC   Post-op Pain Management:    Induction: Intravenous  Airway Management Planned: Natural Airway and Nasal Cannula  Additional Equipment:   Intra-op Plan:   Post-operative Plan:   Informed Consent: I have reviewed the patients History and Physical, chart, labs and discussed the procedure including the risks, benefits and alternatives for the proposed anesthesia with the patient or authorized representative who has indicated his/her understanding and acceptance.   Dental advisory given  Plan Discussed with: CRNA  Anesthesia Plan Comments:         Anesthesia Quick Evaluation

## 2015-07-17 NOTE — Discharge Instructions (Addendum)
Call if question or problem otherwise stay upright for 2 hours after eating use plenty of liquids and clear liquids only until 8 PM and if doing well may have ensure or Carnation Instant Breakfast or similar products at that time and call if losing weight increase fever or signs of bleeding or increased pain nausea vomiting or other question or problems otherwise follow-up in 2 weeks in the office and call my nurse Pamala Hurry for the appointment  Esophagogastroduodenoscopy, Care After Refer to this sheet in the next few weeks. These instructions provide you with information about caring for yourself after your procedure. Your health care provider may also give you more specific instructions. Your treatment has been planned according to current medical practices, but problems sometimes occur. Call your health care provider if you have any problems or questions after your procedure. WHAT TO EXPECT AFTER THE PROCEDURE After your procedure, it is typical to feel:  Soreness in your throat.  Pain with swallowing.  Sick to your stomach (nauseous).  Bloated.  Dizzy.  Fatigued. HOME CARE INSTRUCTIONS  Do not eat or drink anything until the numbing medicine (local anesthetic) has worn off and your gag reflex has returned. You will know that the local anesthetic has worn off when you can swallow comfortably.  Do not drive or operate machinery until directed by your health care provider.  Take medicines only as directed by your health care provider. SEEK MEDICAL CARE IF:   You cannot stop coughing.  You are not urinating at all or less than usual. SEEK IMMEDIATE MEDICAL CARE IF:  You have difficulty swallowing.  You cannot eat or drink.  You have worsening throat or chest pain.  You have dizziness or lightheadedness or you faint.  You have nausea or vomiting.  You have chills.  You have a fever.  You have severe abdominal pain.  You have black, tarry, or bloody stools.   This  information is not intended to replace advice given to you by your health care provider. Make sure you discuss any questions you have with your health care provider.   Document Released: 08/04/2012 Document Revised: 09/08/2014 Document Reviewed: 08/04/2012 Elsevier Interactive Patient Education [employer].

## 2015-07-17 NOTE — Progress Notes (Signed)
[place] 1:21 PM  Subjective: Patient without any new symptoms since we last saw her and we rediscussed her condition with her and her mother  Objective: Vital signs stable afebrile no acute distress exam please see preassessment evaluation minimal midepigastric discomfort only  Assessment: Gastric outlet outlet obstruction  Plan: Okay to proceed with endoscopy with probable dilation and possible stenting of anastomosis with anesthesia assistance  [facility]  Pager [phone] After 5PM or if no answer call [phone]

## 2015-07-17 NOTE — Progress Notes (Signed)
Patient's headache went from a 6 on a scale of 0 to 10 to a four after hydralazine given.

## 2015-07-18 ENCOUNTER — Encounter (HOSPITAL_COMMUNITY): Payer: Self-pay | Admitting: Gastroenterology

## 2015-07-31 ENCOUNTER — Ambulatory Visit
Admission: RE | Admit: 2015-07-31 | Discharge: 2015-07-31 | Disposition: A | Discharge status: Home / Self Care | Payer: Medicare Other | Source: Ambulatory Visit | Attending: Gastroenterology | Admitting: Gastroenterology

## 2015-07-31 ENCOUNTER — Other Ambulatory Visit: Payer: Self-pay | Admitting: Gastroenterology

## 2015-07-31 DIAGNOSIS — R633 Feeding difficulties: Secondary | ICD-10-CM

## 2015-07-31 DIAGNOSIS — R634 Abnormal weight loss: Secondary | ICD-10-CM

## 2015-07-31 DIAGNOSIS — R6339 Other feeding difficulties: Secondary | ICD-10-CM

## 2015-07-31 DIAGNOSIS — K311 Adult hypertrophic pyloric stenosis: Secondary | ICD-10-CM

## 2015-08-09 ENCOUNTER — Other Ambulatory Visit: Payer: Self-pay | Admitting: Family Medicine

## 2015-08-13 ENCOUNTER — Encounter (HOSPITAL_COMMUNITY): Payer: Self-pay | Admitting: *Deleted

## 2015-08-13 ENCOUNTER — Emergency Department (HOSPITAL_COMMUNITY): Payer: Medicare Other

## 2015-08-13 ENCOUNTER — Inpatient Hospital Stay (HOSPITAL_COMMUNITY)
Admission: EM | Admit: 2015-08-13 | Discharge: 2015-08-20 | DRG: 871 | Disposition: A | Discharge status: Skilled Nursing Facility | Payer: Medicare Other | Attending: Internal Medicine | Admitting: Internal Medicine

## 2015-08-13 DIAGNOSIS — Z79899 Other long term (current) drug therapy: Secondary | ICD-10-CM

## 2015-08-13 DIAGNOSIS — K279 Peptic ulcer, site unspecified, unspecified as acute or chronic, without hemorrhage or perforation: Secondary | ICD-10-CM | POA: Diagnosis present

## 2015-08-13 DIAGNOSIS — B9562 Methicillin resistant Staphylococcus aureus infection as the cause of diseases classified elsewhere: Secondary | ICD-10-CM | POA: Diagnosis not present

## 2015-08-13 DIAGNOSIS — E860 Dehydration: Secondary | ICD-10-CM | POA: Diagnosis present

## 2015-08-13 DIAGNOSIS — J15212 Pneumonia due to Methicillin resistant Staphylococcus aureus: Secondary | ICD-10-CM | POA: Diagnosis present

## 2015-08-13 DIAGNOSIS — A4902 Methicillin resistant Staphylococcus aureus infection, unspecified site: Secondary | ICD-10-CM | POA: Diagnosis not present

## 2015-08-13 DIAGNOSIS — Z8249 Family history of ischemic heart disease and other diseases of the circulatory system: Secondary | ICD-10-CM | POA: Diagnosis not present

## 2015-08-13 DIAGNOSIS — R1013 Epigastric pain: Secondary | ICD-10-CM | POA: Diagnosis present

## 2015-08-13 DIAGNOSIS — F17210 Nicotine dependence, cigarettes, uncomplicated: Secondary | ICD-10-CM | POA: Diagnosis present

## 2015-08-13 DIAGNOSIS — A4102 Sepsis due to Methicillin resistant Staphylococcus aureus: Principal | ICD-10-CM | POA: Diagnosis present

## 2015-08-13 DIAGNOSIS — F418 Other specified anxiety disorders: Secondary | ICD-10-CM | POA: Diagnosis present

## 2015-08-13 DIAGNOSIS — J189 Pneumonia, unspecified organism: Secondary | ICD-10-CM | POA: Diagnosis not present

## 2015-08-13 DIAGNOSIS — R9389 Abnormal findings on diagnostic imaging of other specified body structures: Secondary | ICD-10-CM

## 2015-08-13 DIAGNOSIS — D649 Anemia, unspecified: Secondary | ICD-10-CM | POA: Diagnosis present

## 2015-08-13 DIAGNOSIS — Z886 Allergy status to analgesic agent status: Secondary | ICD-10-CM

## 2015-08-13 DIAGNOSIS — J15211 Pneumonia due to Methicillin susceptible Staphylococcus aureus: Secondary | ICD-10-CM | POA: Diagnosis not present

## 2015-08-13 DIAGNOSIS — D638 Anemia in other chronic diseases classified elsewhere: Secondary | ICD-10-CM | POA: Diagnosis present

## 2015-08-13 DIAGNOSIS — Y95 Nosocomial condition: Secondary | ICD-10-CM | POA: Diagnosis present

## 2015-08-13 DIAGNOSIS — Z9884 Bariatric surgery status: Secondary | ICD-10-CM

## 2015-08-13 DIAGNOSIS — Z888 Allergy status to other drugs, medicaments and biological substances status: Secondary | ICD-10-CM

## 2015-08-13 DIAGNOSIS — J918 Pleural effusion in other conditions classified elsewhere: Secondary | ICD-10-CM | POA: Diagnosis present

## 2015-08-13 DIAGNOSIS — J440 Chronic obstructive pulmonary disease with acute lower respiratory infection: Secondary | ICD-10-CM | POA: Diagnosis present

## 2015-08-13 DIAGNOSIS — Z91048 Other nonmedicinal substance allergy status: Secondary | ICD-10-CM | POA: Diagnosis not present

## 2015-08-13 DIAGNOSIS — Z881 Allergy status to other antibiotic agents status: Secondary | ICD-10-CM

## 2015-08-13 DIAGNOSIS — D473 Essential (hemorrhagic) thrombocythemia: Secondary | ICD-10-CM | POA: Diagnosis present

## 2015-08-13 DIAGNOSIS — J850 Gangrene and necrosis of lung: Secondary | ICD-10-CM | POA: Diagnosis present

## 2015-08-13 DIAGNOSIS — E876 Hypokalemia: Secondary | ICD-10-CM | POA: Diagnosis present

## 2015-08-13 DIAGNOSIS — L8990 Pressure ulcer of unspecified site, unspecified stage: Secondary | ICD-10-CM | POA: Insufficient documentation

## 2015-08-13 DIAGNOSIS — R0602 Shortness of breath: Secondary | ICD-10-CM

## 2015-08-13 DIAGNOSIS — D509 Iron deficiency anemia, unspecified: Secondary | ICD-10-CM

## 2015-08-13 DIAGNOSIS — Z8544 Personal history of malignant neoplasm of other female genital organs: Secondary | ICD-10-CM | POA: Diagnosis not present

## 2015-08-13 DIAGNOSIS — I10 Essential (primary) hypertension: Secondary | ICD-10-CM | POA: Diagnosis present

## 2015-08-13 DIAGNOSIS — K311 Adult hypertrophic pyloric stenosis: Secondary | ICD-10-CM | POA: Diagnosis present

## 2015-08-13 DIAGNOSIS — G8929 Other chronic pain: Secondary | ICD-10-CM | POA: Diagnosis present

## 2015-08-13 DIAGNOSIS — J90 Pleural effusion, not elsewhere classified: Secondary | ICD-10-CM | POA: Diagnosis not present

## 2015-08-13 DIAGNOSIS — A419 Sepsis, unspecified organism: Secondary | ICD-10-CM | POA: Diagnosis not present

## 2015-08-13 DIAGNOSIS — R109 Unspecified abdominal pain: Secondary | ICD-10-CM | POA: Diagnosis present

## 2015-08-13 DIAGNOSIS — Z9889 Other specified postprocedural states: Secondary | ICD-10-CM

## 2015-08-13 DIAGNOSIS — J9601 Acute respiratory failure with hypoxia: Secondary | ICD-10-CM | POA: Diagnosis present

## 2015-08-13 DIAGNOSIS — J948 Other specified pleural conditions: Secondary | ICD-10-CM | POA: Diagnosis not present

## 2015-08-13 DIAGNOSIS — E43 Unspecified severe protein-calorie malnutrition: Secondary | ICD-10-CM | POA: Diagnosis present

## 2015-08-13 LAB — URINALYSIS, ROUTINE W REFLEX MICROSCOPIC
Bilirubin Urine: NEGATIVE
GLUCOSE, UA: NEGATIVE mg/dL
Hgb urine dipstick: NEGATIVE
Ketones, ur: 15 mg/dL — AB
LEUKOCYTES UA: NEGATIVE
Nitrite: NEGATIVE
PH: 5.5 (ref 5.0–8.0)
Protein, ur: NEGATIVE mg/dL
Specific Gravity, Urine: 1.02 (ref 1.005–1.030)

## 2015-08-13 LAB — BASIC METABOLIC PANEL
Anion gap: 9 (ref 5–15)
BUN: 13 mg/dL (ref 6–20)
CHLORIDE: 103 mmol/L (ref 101–111)
CO2: 29 mmol/L (ref 22–32)
Calcium: 8.2 mg/dL — ABNORMAL LOW (ref 8.9–10.3)
Creatinine, Ser: 0.52 mg/dL (ref 0.44–1.00)
GFR calc Af Amer: >60 mL/min (ref >60)
GFR calc non Af Amer: >60 mL/min (ref >60)
GLUCOSE: 65 mg/dL (ref 65–99)
POTASSIUM: 2.6 mmol/L — AB (ref 3.5–5.1)
Sodium: 141 mmol/L (ref 135–145)

## 2015-08-13 LAB — CBC WITH DIFFERENTIAL/PLATELET
BASOS ABS: 0 10*3/uL (ref 0.0–0.1)
BASOS PCT: 0 %
EOS ABS: 0.1 10*3/uL (ref 0.0–0.7)
EOS PCT: 1 %
HEMATOCRIT: 27.2 % — AB (ref 36.0–46.0)
Hemoglobin: 8.2 g/dL — ABNORMAL LOW (ref 12.0–15.0)
Lymphocytes Relative: 3 %
Lymphs Abs: 0.5 10*3/uL — ABNORMAL LOW (ref 0.7–4.0)
MCH: 24.2 pg — ABNORMAL LOW (ref 26.0–34.0)
MCHC: 30.1 g/dL (ref 30.0–36.0)
MCV: 80.2 fL (ref 78.0–100.0)
MONO ABS: 0.6 10*3/uL (ref 0.1–1.0)
MONOS PCT: 4 %
Neutro Abs: 15.2 10*3/uL — ABNORMAL HIGH (ref 1.7–7.7)
Neutrophils Relative %: 92 %
PLATELETS: 596 10*3/uL — AB (ref 150–400)
RBC: 3.39 MIL/uL — ABNORMAL LOW (ref 3.87–5.11)
RDW: 16 % — AB (ref 11.5–15.5)
WBC: 16.4 10*3/uL — ABNORMAL HIGH (ref 4.0–10.5)

## 2015-08-13 LAB — I-STAT CG4 LACTIC ACID, ED: LACTIC ACID, VENOUS: 1.22 mmol/L (ref 0.5–2.0)

## 2015-08-13 LAB — PHOSPHORUS: Phosphorus: 3.4 mg/dL (ref 2.5–4.6)

## 2015-08-13 LAB — STREP PNEUMONIAE URINARY ANTIGEN: STREP PNEUMO URINARY ANTIGEN: NEGATIVE

## 2015-08-13 LAB — MAGNESIUM: MAGNESIUM: 2 mg/dL (ref 1.7–2.4)

## 2015-08-13 MED ORDER — SODIUM CHLORIDE 0.9 % IV BOLUS (SEPSIS)
1000.0000 mL | Freq: Once | INTRAVENOUS | Status: AC
  Administered 2015-08-13: 1000 mL via INTRAVENOUS

## 2015-08-13 MED ORDER — ALBUTEROL SULFATE (2.5 MG/3ML) 0.083% IN NEBU: 2.5000 mg | INHALATION_SOLUTION | Freq: Four times a day (QID) | RESPIRATORY_TRACT | Status: DC

## 2015-08-13 MED ORDER — VITAMIN D 1000 UNITS PO TABS
1000.0000 [IU] | ORAL_TABLET | Freq: Every day | ORAL | Status: DC
Start: 2015-08-13 — End: 2015-08-20
  Administered 2015-08-14 – 2015-08-19 (×6): 1000 [IU] via ORAL
  Filled 2015-08-13 (×7): qty 1

## 2015-08-13 MED ORDER — PANTOPRAZOLE SODIUM 40 MG IV SOLR
40.0000 mg | INTRAVENOUS | Status: DC
  Administered 2015-08-13 – 2015-08-15 (×3): 40 mg via INTRAVENOUS
  Filled 2015-08-13 (×3): qty 40

## 2015-08-13 MED ORDER — LORATADINE 10 MG PO TABS: 10.0000 mg | ORAL_TABLET | Freq: Every day | ORAL | Status: DC | PRN

## 2015-08-13 MED ORDER — VANCOMYCIN HCL IN DEXTROSE 1-5 GM/200ML-% IV SOLN
1000.0000 mg | INTRAVENOUS | Status: DC
  Administered 2015-08-13 – 2015-08-16 (×4): 1000 mg via INTRAVENOUS
  Filled 2015-08-13 (×7): qty 200

## 2015-08-13 MED ORDER — VITAMIN C 500 MG PO TABS
500.0000 mg | ORAL_TABLET | Freq: Every day | ORAL | Status: DC
  Administered 2015-08-14 – 2015-08-19 (×5): 500 mg via ORAL
  Filled 2015-08-13 (×7): qty 1

## 2015-08-13 MED ORDER — POTASSIUM CHLORIDE IN NACL 40-0.9 MEQ/L-% IV SOLN
INTRAVENOUS | Status: DC
  Administered 2015-08-13 – 2015-08-14 (×2): 100 mL/h via INTRAVENOUS
  Filled 2015-08-13 (×3): qty 1000

## 2015-08-13 MED ORDER — IPRATROPIUM BROMIDE 0.02 % IN SOLN: 0.5000 mg | Freq: Four times a day (QID) | RESPIRATORY_TRACT | Status: DC

## 2015-08-13 MED ORDER — SODIUM CHLORIDE 0.9 % IJ SOLN
3.0000 mL | Freq: Two times a day (BID) | INTRAMUSCULAR | Status: DC
  Administered 2015-08-13 – 2015-08-20 (×11): 3 mL via INTRAVENOUS

## 2015-08-13 MED ORDER — VITAMIN B-12 1000 MCG PO TABS
1000.0000 ug | ORAL_TABLET | Freq: Every day | ORAL | Status: DC
  Administered 2015-08-14 – 2015-08-19 (×6): 1000 ug via ORAL
  Filled 2015-08-13 (×7): qty 1

## 2015-08-13 MED ORDER — CILIDINIUM-CHLORDIAZEPOXIDE 2.5-5 MG PO CAPS
1.0000 | ORAL_CAPSULE | Freq: Three times a day (TID) | ORAL | Status: DC
  Administered 2015-08-14 (×2): 1 via ORAL
  Filled 2015-08-13 (×18): qty 1

## 2015-08-13 MED ORDER — MORPHINE SULFATE (PF) 4 MG/ML IV SOLN
4.0000 mg | Freq: Once | INTRAVENOUS | Status: AC
  Administered 2015-08-13: 4 mg via INTRAVENOUS
  Filled 2015-08-13: qty 1

## 2015-08-13 MED ORDER — DIPHENHYDRAMINE HCL 25 MG PO TABS: 12.5000 mg | ORAL_TABLET | Freq: Four times a day (QID) | ORAL | Status: DC | PRN

## 2015-08-13 MED ORDER — HYDROMORPHONE HCL 1 MG/ML IJ SOLN
1.0000 mg | INTRAMUSCULAR | Status: DC | PRN
  Administered 2015-08-14 – 2015-08-19 (×13): 1 mg via INTRAVENOUS
  Filled 2015-08-13 (×14): qty 1

## 2015-08-13 MED ORDER — DEXTROSE 5 % IV SOLN
2.0000 g | Freq: Three times a day (TID) | INTRAVENOUS | Status: DC
  Administered 2015-08-13 – 2015-08-16 (×7): 2 g via INTRAVENOUS
  Filled 2015-08-13 (×10): qty 2

## 2015-08-13 MED ORDER — ONDANSETRON HCL 4 MG/2ML IJ SOLN
4.0000 mg | Freq: Once | INTRAMUSCULAR | Status: AC
  Administered 2015-08-13: 4 mg via INTRAVENOUS
  Filled 2015-08-13: qty 2

## 2015-08-13 MED ORDER — IPRATROPIUM-ALBUTEROL 0.5-2.5 (3) MG/3ML IN SOLN
3.0000 mL | Freq: Four times a day (QID) | RESPIRATORY_TRACT | Status: DC
  Administered 2015-08-13 – 2015-08-17 (×14): 3 mL via RESPIRATORY_TRACT
  Filled 2015-08-13 (×17): qty 3

## 2015-08-13 MED ORDER — CEFTAZIDIME 2 G IJ SOLR: 2.0000 g | Freq: Three times a day (TID) | INTRAMUSCULAR | Status: DC

## 2015-08-13 MED ORDER — ONDANSETRON HCL 4 MG PO TABS: 4.0000 mg | ORAL_TABLET | Freq: Four times a day (QID) | ORAL | Status: DC | PRN

## 2015-08-13 MED ORDER — ONDANSETRON HCL 4 MG/2ML IJ SOLN
4.0000 mg | Freq: Four times a day (QID) | INTRAMUSCULAR | Status: DC | PRN
  Administered 2015-08-14: 4 mg via INTRAVENOUS
  Filled 2015-08-13: qty 2

## 2015-08-13 MED ORDER — POTASSIUM CHLORIDE 10 MEQ/100ML IV SOLN
10.0000 meq | INTRAVENOUS | Status: AC
  Administered 2015-08-13 (×3): 10 meq via INTRAVENOUS
  Filled 2015-08-13 (×3): qty 100

## 2015-08-13 MED ORDER — BUPROPION HCL ER (XL) 300 MG PO TB24
300.0000 mg | ORAL_TABLET | Freq: Every day | ORAL | Status: DC
Start: 2015-08-14 — End: 2015-08-20
  Administered 2015-08-14 – 2015-08-19 (×6): 300 mg via ORAL
  Filled 2015-08-13 (×8): qty 1

## 2015-08-13 NOTE — ED Notes (Signed)
New IV placed in L FA d/t irritation of vein above R IV site.

## 2015-08-13 NOTE — Progress Notes (Signed)
ANTIBIOTIC CONSULT NOTE - INITIAL  Pharmacy Consult for Vancomycin Indication: rule out pneumonia  Allergies  Allergen Reactions  . Ciprocinonide [Fluocinolone] Hives and Rash  . Ciprofloxacin Other (See Comments)    Blisters, boils  . Dust Mite Extract Itching  . Iohexol Hives       . Nsaids Other (See Comments)    Due to Hx of gastric bypass    Patient Measurements: Height: 5\' 4"  (162.6 cm) Weight: 91 lb (41.277 kg) IBW/kg (Calculated) : 54.7 Adjusted Body Weight:   Vital Signs: BP: 141/75 mmHg (12/12 1534) Pulse Rate: 92 (12/12 1533) Intake/Output from previous day:   Intake/Output from this shift:    Labs:  Recent Labs  08/13/15 1550  WBC 16.4*  HGB 8.2*  PLT 596*  CREATININE 0.52   Estimated Creatinine Clearance: 51.2 mL/min (by C-G formula based on Cr of 0.52). No results for input(s): VANCOTROUGH, VANCOPEAK, VANCORANDOM, GENTTROUGH, GENTPEAK, GENTRANDOM, TOBRATROUGH, TOBRAPEAK, TOBRARND, AMIKACINPEAK, AMIKACINTROU, AMIKACIN in the last 72 hours.   Microbiology: No results found for this or any previous visit (from the past 720 hour(s)).  Medical History: Past Medical History  Diagnosis Date  . Abnormal Pap smear   . Vulvar lesion   . CIN II (cervical intraepithelial neoplasia II)   . Malnourished ([place])   . Sleep apnea     Loss weight with gastric bypass  . Cancer ([place])     vulvar  . Anemia     blood transfusion  . Hypertension     no longer on medication  . Anxiety   . Depression   . History of stomach ulcers     Medications:   (Not in a hospital admission) Scheduled:   Infusions:  . cefTAZidime (FORTAZ)  IV    . potassium chloride    . vancomycin     Assessment: 54yo female with history of HTN, anxiety, depression and malnourishment presents with cough and dehydration. Pharmacy is consulted to dose vancomycin for suspected HCAP. WBC 16.4, sCr 0.52, LA 1.22.  Goal of Therapy:  Vancomycin trough level 15-20 mcg/ml  Plan:   Vancomycin 1g IV q24h Ceftazidime 2g IV q8h Expected duration 7 days with resolution of temperature and/or normalization of WBC Measure antibiotic drug levels at steady state Follow up culture results, renal function and clinical course  Andrey Cota. Diona Foley, PharmD, [place] Clinical Pharmacist Pager [phone] 08/13/2015,5:01 PM

## 2015-08-13 NOTE — ED Notes (Signed)
May hold iv for lab return. Per Salomon Fick

## 2015-08-13 NOTE — H&P (Signed)
[facility] History and Physical  ATHALENE UTHOFF [id number] DOB: 1960/10/28 DOA: 08/13/2015  Referring physician: Margarita Mail, PA-C PCP: Robyn Haber, MD   Chief Complaint: fever and shortness of breath.  History provided mostly by the patient's husband.  HPI: Autumn Turner is a 54 y.o. female with a past medical history gastric bypass surgery, PUD,  cholecystectomy, C-section, hypertension, anxiety, depression, vulvar cancer who comes to the emergency department via EMS  due to a week of progressively fatigue, productive cough, mild dyspnea, decreased appetite and fevers up to 103F. She denies pleuritic chest pain, hemoptysis, palpitations, diaphoresis, but feels mildly lightheaded. Patient denies travel history or sick contacts.   She also complains of intense chronic abdominal pain. She recently underwent gastric outlet dilatation and esophageal stent placement by Dr. Watt Climes. She denies nausea, emesis, melena or hematochezia.  Review of Systems:  Constitutional:  Positive Fevers, chills, fatigue.  No weight loss, night sweats,  HEENT:  No headaches, Difficulty swallowing,Tooth/dental problems,Sore throat,  No sneezing, itching, ear ache, nasal congestion, post nasal drip,  Cardio-vascular:  No chest pain, Orthopnea, PND, swelling in lower extremities, anasarca, dizziness, palpitations  GI:  Positive abdominal pain, loss of appetite  No heartburn, indigestion,  nausea, vomiting, diarrhea, change in bowel habits,  Resp:  Mild dyspnea, positive productive cough of greenish sputum, no hemoptysis, no wheezing. Skin:  No rash or lesions.  GU:  No dysuria, change in color of urine, no urgency or frequency. No flank pain.  Musculoskeletal:  No joint pain or swelling. No decreased range of motion. No back pain.  Psych:  History of depression or anxiety.  No change in mood or affect. No memory loss.   Past Medical History  Diagnosis Date  . Abnormal Pap smear   .  Vulvar lesion   . CIN II (cervical intraepithelial neoplasia II)   . Malnourished ([place])   . Sleep apnea     Loss weight with gastric bypass  . Cancer ([place])     vulvar  . Anemia     blood transfusion  . Hypertension     no longer on medication  . Anxiety   . Depression   . History of stomach ulcers    Past Surgical History  Procedure Laterality Date  . Cesarean section    . Gastric bypass  2001  . Gallbladder surgery  2002  . Tummy tuck  2004  . Leep  06/2009  . Cholecystectomy    . Tonsillectomy      age 40 adnoids  . Vulvectomy Bilateral 12/07/2012    Procedure: VULVECTOMY AND BILATERAL INGUINAL LYMPH NODE DISSECTION, PAP SMEAR;  Surgeon: Janie Morning, MD;  Location: [facility]S;  Service: Gynecology;  Laterality: Bilateral;  . Revision to gastric bypass    . Esophagogastroduodenoscopy N/A 06/29/2015    Procedure: ESOPHAGOGASTRODUODENOSCOPY (EGD);  Surgeon: Clarene Essex, MD;  Location[facility] END[facility]COPY;  Service: Endoscopy;  Laterality: N/A;  . Balloon dilation N/A 06/29/2015    Procedure: BALLOON DILATION;  Surgeon: Clarene Essex, MD;  Location: [facility] ENDOSCOPY;  Service: Endoscopy;  Laterality: N/A;  . Esophagogastroduodenoscopy (egd) with propofol N/A 07/17/2015    Procedure: ESOPHAGOGASTRODUODENOSCOPY (EGD) WITH PROPOFOL;  Surgeon: Clarene Essex, MD;  Location: [facility] ENDOSCOPY;  Service: Endoscopy;  Laterality: N/A;  . Balloon dilation N/A 07/17/2015    Procedure: BALLOON DILATION;  Surgeon: Clarene Essex, MD;  Location: [facility] ENDOSCOPY;  Service: Endoscopy;  Laterality: N/A;  . Esophageal stent placement N/A 07/17/2015    Procedure: ESOPHAGEAL STENT PLACEMENT;  Surgeon: Clarene Essex, MD;  Location: [facility] ENDOSCOPY;  Service: Endoscopy;  Laterality: N/A;   Social History:  reports that she has been smoking Cigarettes.  She has a 10 pack-year smoking history. She has never used smokeless tobacco. She reports that she does not drink alcohol or use illicit drugs.  Allergies  Allergen Reactions  .  Ciprocinonide [Fluocinolone] Hives and Rash  . Ciprofloxacin Other (See Comments)    Blisters, boils  . Dust Mite Extract Itching  . Iohexol Hives       . Nsaids Other (See Comments)    Due to Hx of gastric bypass    Family History  Problem Relation Age of Onset  . Cancer Father   . Hypertension Father   . Lung cancer Father     Prior to Admission medications   Medication Sig Start Date End Date Taking? Authorizing Provider  buPROPion (WELLBUTRIN XL) 150 MG 24 hr tablet Take 300 mg by mouth daily.   Yes Historical Provider, MD  cholecalciferol (VITAMIN D) 1000 UNITS tablet Take 1,000 Units by mouth daily.   Yes Historical Provider, MD  clidinium-chlordiazePOXIDE (LIBRAX) 5-2.5 MG capsule Take 1 capsule by mouth 4 (four) times daily -  before meals and at bedtime. 08/01/15  Yes Historical Provider, MD  diphenhydrAMINE (BENADRYL) 12.5 MG chewable tablet Chew 12.5 mg by mouth 4 (four) times daily as needed for allergies.   Yes Historical Provider, MD  esomeprazole (NEXIUM) 20 MG capsule Take 20 mg by mouth daily.   Yes Historical Provider, MD  HYDROcodone-acetaminophen (NORCO/VICODIN) 5-325 MG tablet Take 1 tablet by mouth daily as needed. 07/17/15  Yes Historical Provider, MD  loratadine (CLARITIN) 10 MG tablet Take 10 mg by mouth daily as needed for allergies or rhinitis.    Yes Historical Provider, MD  Multiple Vitamins-Minerals (MULTIVITAMIN PO) Take 1 tablet by mouth daily.    Yes Historical Provider, MD  Tetrahydrozoline HCl (VISINE OP) Place 1-2 drops into both eyes as needed (for dry eyes).   Yes Historical Provider, MD  traMADol (ULTRAM) 50 MG tablet TAKE 1 TABLET BY MOUTH THREE TIMES DAILY AS NEEDED Patient taking differently: Take 50 mg by mouth every 8 (eight) hours as needed for severe pain. TAKE 1 TABLET BY MOUTH THREE TIMES DAILY AS NEEDED 01/25/15  Yes Robyn Haber, MD  vitamin B-12 (CYANOCOBALAMIN) 1000 MCG tablet Take 1,000 mcg by mouth daily.   Yes Historical  Provider, MD  vitamin C (ASCORBIC ACID) 500 MG tablet Take 500 mg by mouth daily.   Yes Historical Provider, MD  buPROPion (WELLBUTRIN XL) 300 MG 24 hr tablet Take 1 tablet (300 mg total) by mouth daily. PATIENT NEEDS OFFICE VISIT FOR ADDITIONAL REFILLS 08/10/15   Robyn Haber, MD   Physical Exam: Filed Vitals:   08/13/15 1815 08/13/15 1830 08/13/15 1845 08/13/15 1900  BP: 131/78 125/68 135/85 132/86  Pulse: 96 93 89 92  Resp:      Height:      Weight:      SpO2: 95% 95% 96% 94%    Wt Readings from Last 3 Encounters:  08/13/15 41.277 kg (91 lb)  07/17/15 40.37 kg (89 lb)  06/29/15 40.37 kg (89 lb)    General:  Appears ill and malnourished.  Eyes: PERRL, normal lids, irises & conjunctiva ENT: grossly normal hearing, lips, tongue and oral mucosa are very dry  Neck: no LAD, masses or thyromegaly Cardiovascular: RRR, no m/r/g. No LE edema. Telemetry: SR, no arrhythmias  Respiratory: Mildly tachypneic at 23  breaths per minute, Rhonchi and rales  bilaterally,right more than left. Abdomen:  bowel sounds positive, soft, positive epigastric tenderness, no guarding, no rebound tenderness.  Skin: no rash or induration seen on limited exam Musculoskeletal: grossly normal tone BUE/BLE Psychiatric: grossly normal mood and affect, speech fluent and appropriate Neurologic: grossly non-focal.          Labs on Admission:  Basic Metabolic Panel:  Recent Labs Lab 08/13/15 1550  NA 141  K 2.6*  CL 103  CO2 29  GLUCOSE 65  BUN 13  CREATININE 0.52  CALCIUM 8.2*   CBC:  Recent Labs Lab 08/13/15 1550  WBC 16.4*  NEUTROABS 15.2*  HGB 8.2*  HCT 27.2*  MCV 80.2  PLT 596*    Radiological Exams on Admission: Dg Chest 2 View  08/13/2015  CLINICAL DATA:  54 year old female with productive cough and recent fevers EXAM: CHEST  2 VIEW COMPARISON:  Most recent prior chest x-ray 12/03/2012 ; abdominal radiograph including the lower lungs 07/31/2015 FINDINGS: Interval development of  multifocal patchy ground-glass attenuation opacity with areas of increased confluence in the right upper, right middle and right lower lobes. More mild patchy airspace opacity is also present within the left lower lobe. There are small bilateral pleural effusions. A rounded lucency in the right lung apex raises concern for underlying cavitation or bulla formation. The parenchymal findings represent a dramatic change compared to the relatively recent abdominal radiographs dated 07/31/2015. Incompletely imaged stent again noted over the left upper abdomen. No acute osseous abnormality. IMPRESSION: 1. Interval development of multifocal airspace consolidation throughout the right lung, and to a lesser extent in the left lower lobe compared to 07/31/2015. Findings are concerning for multi lobar pneumonia, or possibly aspiration given the presence of the esophageal stent. 2. A rounded lucency overlying the right lung apex may represent a geographic region of relatively spared lung, or possibly developing cavitation or bulla formation. Recommend attention on follow-up chest radiographs. If the abnormality persists, CT scan of the chest may become warranted. 3. Small bilateral pleural effusions. Electronically Signed   By: Jacqulynn Cadet M.D.   On: 08/13/2015 16:31    EKG: Independently reviewed. Ordered.     Assessment/Plan Principal Problem:   HCAP (healthcare-associated pneumonia) Admit to telemetry. Continue supplemental oxygen.  continue bronchodilators. Continue IV vancomycin and Zosyn per pharmacy. Follow-up blood cultures. Check sputum Gram stain, culture and sensitivity. Check Legionella and strep pneumoniae urine antigens.  Active Problems:   Hypokalemia Continue potassium replacement. Follow-up potassium level. Check magnesium level.    Anemia Monitor H&H.   Chronic Abdominal pain Continue analgesics and antiemetics as needed.    Code Status: Full code. DVT Prophylaxis:  SCDs. Family Communication: Her husband was by bedside. Disposition Plan: Admit for IV antibiotic therapy.  Time spent: Over 70 minutes were spent during the process of this admission.   Reubin Milan [facility] Pager [phone].

## 2015-08-13 NOTE — ED Notes (Signed)
Called pharm about verifying meds.

## 2015-08-13 NOTE — ED Notes (Signed)
Called to give report on 5W, stated that they do not have a neg pressure room, called bed control to inform.

## 2015-08-13 NOTE — ED Notes (Addendum)
Pt in from home via [facility], per report pt was febrile last week, pt has productive cough with thick clear sputum, pt denies CP, SOB, N/v/d, pt hx of Gastric bypass, with pt reported intestinal stent last month, pt hx of dehydration since the procedure, pt A&O x4

## 2015-08-13 NOTE — ED Provider Notes (Signed)
CSN: [id number]     Arrival date & time 08/13/15  1527 History   First MD Initiated Contact with Patient 08/13/15 1542     Chief Complaint  Patient presents with  . Cough     (Consider location/radiation/quality/duration/timing/severity/associated sxs/prior Treatment) The history is provided by the patient and medical records.     Autumn Turner is a(n) 54 y.o. female who presents with cough, fever, and dehydration The patient states that she developed sxs of URI last week with Productive cough of thick sputum, myalgias, and fevers with a max recorded home temp of 103 F. She states that she had a Gastric bypass and that she has little reserve so when she gets sick she gets very weak. She has had markedly decreased appetite and feels weak and dehydrated. Denies fevers, chills, myalgias, arthralgias. Denies DOE, SOB, chest tightness or pressure, radiation to left arm, jaw or back, or diaphoresis. Denies dysuria, flank pain, suprapubic pain, frequency, urgency, or hematuria. Denies headaches, light headedness, weakness, visual disturbances. Denies abdominal pain, nausea, vomiting, diarrhea or constipation.    Past Medical History  Diagnosis Date  . Abnormal Pap smear   . Vulvar lesion   . CIN II (cervical intraepithelial neoplasia II)   . Malnourished ([place])   . Sleep apnea     Loss weight with gastric bypass  . Cancer ([place])     vulvar  . Anemia     blood transfusion  . Hypertension     no longer on medication  . Anxiety   . Depression   . History of stomach ulcers    Past Surgical History  Procedure Laterality Date  . Cesarean section    . Gastric bypass  2001  . Gallbladder surgery  2002  . Tummy tuck  2004  . Leep  06/2009  . Cholecystectomy    . Tonsillectomy      age 23 adnoids  . Vulvectomy Bilateral 12/07/2012    Procedure: VULVECTOMY AND BILATERAL INGUINAL LYMPH NODE DISSECTION, PAP SMEAR;  Surgeon: Janie Morning, MD;  Location: [facility];  Service: Gynecology;   Laterality: Bilateral;  . Revision to gastric bypass    . Esophagogastroduodenoscopy N/A 06/29/2015    Procedure: ESOPHAGOGASTRODUODENOSCOPY (EGD);  Surgeon: Clarene Essex, MD;  Location: [facility] ENDOSCOPY;  Service: Endoscopy;  Laterality: N/A;  . Balloon dilation N/A 06/29/2015    Procedure: BALLOON DILATION;  Surgeon: Clarene Essex, MD;  Location: [facility] ENDOSCOPY;  Service: Endoscopy;  Laterality: N/A;  . Esophagogastroduodenoscopy (egd) with propofol N/A 07/17/2015    Procedure: ESOPHAGOGASTRODUODENOSCOPY (EGD) WITH PROPOFOL;  Surgeon: Clarene Essex, MD;  Location: [facility] ENDOSCOPY;  Service: Endoscopy;  Laterality: N/A;  . Balloon dilation N/A 07/17/2015    Procedure: BALLOON DILATION;  Surgeon: Clarene Essex, MD;  Location[facility] ENDOSCOPY;  Service: Endoscopy;  Laterality: N/A;  . Esophageal stent placement N/A 07/17/2015    Procedure: ESOPHAGEAL STENT PLACEMENT;  Surgeon: Clarene Essex, MD;  Location: [facility] ENDOSCOPY;  Service: Endoscopy;  Laterality: N/A;   Family History  Problem Relation Age of Onset  . Cancer Father   . Hypertension Father   . Lung cancer Father    Social History  Substance Use Topics  . Smoking status: Current Some Day Smoker -- 0.50 packs/day for 20 years    Types: Cigarettes  . Smokeless tobacco: Never Used  . Alcohol Use: No   OB History    No data available     Review of Systems  Ten systems reviewed and are negative for acute  change, except as noted in the HPI.    Allergies  Ciprocinonide; Ciprofloxacin; Dust mite extract; Iohexol; and Nsaids  Home Medications   Prior to Admission medications   Medication Sig Start Date End Date Taking? Authorizing Provider  buPROPion (WELLBUTRIN XL) 300 MG 24 hr tablet Take 1 tablet (300 mg total) by mouth daily. PATIENT NEEDS OFFICE VISIT FOR ADDITIONAL REFILLS 08/10/15   Robyn Haber, MD  cholecalciferol (VITAMIN D) 1000 UNITS tablet Take 1,000 Units by mouth daily.    Historical Provider, MD  diphenhydrAMINE (BENADRYL) 12.5 MG  chewable tablet Chew 12.5 mg by mouth 4 (four) times daily as needed for allergies.    Historical Provider, MD  esomeprazole (NEXIUM) 20 MG capsule Take 20 mg by mouth daily.    Historical Provider, MD  loratadine (CLARITIN) 10 MG tablet Take 10 mg by mouth daily.    Historical Provider, MD  Multiple Vitamins-Minerals (MULTIVITAMIN PO) Take 1 tablet by mouth daily.     Historical Provider, MD  Tetrahydrozoline HCl (VISINE OP) Place 1-2 drops into both eyes as needed (for dry eyes).    Historical Provider, MD  traMADol (ULTRAM) 50 MG tablet TAKE 1 TABLET BY MOUTH THREE TIMES DAILY AS NEEDED Patient not taking: Reported on 06/27/2015 01/25/15   Robyn Haber, MD  vitamin B-12 (CYANOCOBALAMIN) 1000 MCG tablet Take 1,000 mcg by mouth daily.    Historical Provider, MD  vitamin C (ASCORBIC ACID) 500 MG tablet Take 500 mg by mouth daily.    Historical Provider, MD   BP 141/75 mmHg  Pulse 92  Resp 18  Ht 5\' 4"  (1.626 m)  Wt 41.277 kg  BMI 15.61 kg/m2  SpO2 91% Physical Exam  Constitutional: She is oriented to person, place, and time. She appears well-developed and well-nourished. No distress. Nasal cannula in place.  Very thin, protein malnourished. Appears older thatn stated age. On 2 L 02.  HENT:  Head: Normocephalic and atraumatic.  Dry, sticky oral mucosa Edentulous.  Eyes: Conjunctivae are normal. No scleral icterus.  Neck: Normal range of motion.  Cardiovascular: Normal rate, regular rhythm and normal heart sounds.  Exam reveals no gallop and no friction rub.   No murmur heard. Pulmonary/Chest: Effort normal and breath sounds normal. No respiratory distress.  Thick ronchi  Abdominal: Soft. Bowel sounds are normal. She exhibits no distension and no mass. There is no tenderness. There is no guarding.  Neurological: She is alert and oriented to person, place, and time.  Skin: Skin is warm and dry.  Nursing note and vitals reviewed.   ED Course  Procedures (including critical care  time) Labs Review Labs Reviewed  MRSA PCR SCREENING - Abnormal; Notable for the following:    MRSA by PCR POSITIVE (*)    All other components within normal limits  BASIC METABOLIC PANEL - Abnormal; Notable for the following:    Potassium 2.6 (*)    Calcium 8.2 (*)    All other components within normal limits  CBC WITH DIFFERENTIAL/PLATELET - Abnormal; Notable for the following:    WBC 16.4 (*)    RBC 3.39 (*)    Hemoglobin 8.2 (*)    HCT 27.2 (*)    MCH 24.2 (*)    RDW 16.0 (*)    Platelets 596 (*)    Neutro Abs 15.2 (*)    Lymphs Abs 0.5 (*)    All other components within normal limits  COMPREHENSIVE METABOLIC PANEL - Abnormal; Notable for the following:    Potassium 3.3 (*)  Glucose, Bld 59 (*)    Calcium 7.8 (*)    Total Protein 4.5 (*)    Albumin 1.3 (*)    AST 14 (*)    ALT 10 (*)    Alkaline Phosphatase 217 (*)    All other components within normal limits  CBC WITH DIFFERENTIAL/PLATELET - Abnormal; Notable for the following:    WBC 11.7 (*)    RBC 3.08 (*)    Hemoglobin 7.4 (*)    HCT 24.7 (*)    MCH 24.0 (*)    RDW 16.2 (*)    Platelets 522 (*)    Neutro Abs 10.5 (*)    Lymphs Abs 0.5 (*)    All other components within normal limits  URINALYSIS, ROUTINE W REFLEX MICROSCOPIC (NOT AT [facility]) - Abnormal; Notable for the following:    Ketones, ur 15 (*)    All other components within normal limits  IRON AND TIBC - Abnormal; Notable for the following:    Iron 5 (*)    TIBC 160 (*)    Saturation Ratios 3 (*)    All other components within normal limits  LACTATE DEHYDROGENASE, BODY FLUID - Abnormal; Notable for the following:    LD, Fluid 155 (*)    All other components within normal limits  BODY FLUID CELL COUNT WITH DIFFERENTIAL - Abnormal; Notable for the following:    Color, Fluid STRAW (*)    Appearance, Fluid CLOUDY (*)    WBC, Fluid 5894 (*)    Neutrophil Count, Fluid 99 (*)    All other components within normal limits  CULTURE, RESPIRATORY  (NON-EXPECTORATED)  BODY FLUID CULTURE  CULTURE, BLOOD (ROUTINE X 2)  CULTURE, BLOOD (ROUTINE X 2)  AFB CULTURE WITH SMEAR  AFB CULTURE WITH SMEAR  FUNGUS CULTURE W SMEAR  MAGNESIUM  PHOSPHORUS  HIV ANTIBODY (ROUTINE TESTING)  LEGIONELLA ANTIGEN, URINE  STREP PNEUMONIAE URINARY ANTIGEN  FERRITIN  PROTEIN, BODY FLUID  QUANTIFERON TB GOLD ASSAY (BLOOD)  CHOLESTEROL, BODY FLUID  LACTATE DEHYDROGENASE  PROTEIN, TOTAL  CHOLESTEROL, TOTAL  PH, BODY FLUID  I-STAT CG4 LACTIC ACID, ED  CYTOLOGY - NON PAP    Imaging Review Dg Chest 2 View  08/13/2015  CLINICAL DATA:  54 year old female with productive cough and recent fevers EXAM: CHEST  2 VIEW COMPARISON:  Most recent prior chest x-ray 12/03/2012 ; abdominal radiograph including the lower lungs 07/31/2015 FINDINGS: Interval development of multifocal patchy ground-glass attenuation opacity with areas of increased confluence in the right upper, right middle and right lower lobes. More mild patchy airspace opacity is also present within the left lower lobe. There are small bilateral pleural effusions. A rounded lucency in the right lung apex raises concern for underlying cavitation or bulla formation. The parenchymal findings represent a dramatic change compared to the relatively recent abdominal radiographs dated 07/31/2015. Incompletely imaged stent again noted over the left upper abdomen. No acute osseous abnormality. IMPRESSION: 1. Interval development of multifocal airspace consolidation throughout the right lung, and to a lesser extent in the left lower lobe compared to 07/31/2015. Findings are concerning for multi lobar pneumonia, or possibly aspiration given the presence of the esophageal stent. 2. A rounded lucency overlying the right lung apex may represent a geographic region of relatively spared lung, or possibly developing cavitation or bulla formation. Recommend attention on follow-up chest radiographs. If the abnormality persists, CT  scan of the chest may become warranted. 3. Small bilateral pleural effusions. Electronically Signed   By: Dellis Filbert.D.  On: 08/13/2015 16:31   Ct Chest Wo Contrast  08/14/2015  CLINICAL DATA:  54 year old female with generalized fatigue and productive cough (clear sputum) presenting with shortness of breath. Abnormal chest x-ray concerning for multilobar pneumonia. EXAM: CT CHEST WITHOUT CONTRAST TECHNIQUE: Multidetector CT imaging of the chest was performed following the standard protocol without IV contrast. COMPARISON:  No priors. FINDINGS: Mediastinum/Lymph Nodes: Heart size is normal. Small amount of pericardial fluid and/or thickening, unlikely to be of any hemodynamic significance at this time. No associated pericardial calcification. There is atherosclerosis of the thoracic aorta, the great vessels of the mediastinum and the coronary arteries, including calcified atherosclerotic plaque in the left main, left anterior descending, left circumflex and right coronary arteries. Soft tissue fullness in the right hilar region likely reflects underlying lymphadenopathy (assessment is limited on today's noncontrast CT examination). Multiple borderline enlarged right paratracheal lymph nodes are also noted measuring up to 9 mm in short axis. Esophagus is unremarkable in appearance. No axillary lymphadenopathy. Lungs/Pleura: 4.5 x 4.8 cm cavity in the apex of the right upper lobe in the midst of an area of extensive airspace consolidation. Widespread airspace consolidation is noted throughout the right lung, with patchy areas of ground-glass attenuation also noted throughout the left lung, presumably indicative of early endobronchial spread of infection into the contralateral lung. Large right and small left pleural effusions lying dependently. Upper Abdomen: Postoperative changes in the proximal stomach incompletely visualized, but likely related to prior gastric bypass procedure. There is a stent  present in the proximal stomach. Musculoskeletal/Soft Tissues: There are no aggressive appearing lytic or blastic lesions noted in the visualized portions of the skeleton. IMPRESSION: 1. Severe multilobar pneumonia in the lungs bilaterally (right greater than left), with large area of necrosis in the apex of the right upper lobe, and bilateral parapneumonic pleural effusions (right greater than left). 2. Large stent incompletely visualized in the stomach. This is unusual in appearance, and is favored to represent a distally migrated esophageal stent. Clinical correlation is recommended. 3. Prominent soft tissue in the right hilar region likely reflects underlying adenopathy, presumably reactive. There also multiple borderline enlarged right paratracheal lymph nodes. 4. Atherosclerosis, including left main and 3 vessel coronary artery disease. Please note that although the presence of coronary artery calcium documents the presence of coronary artery disease, the severity of this disease and any potential stenosis cannot be assessed on this non-gated CT examination. Assessment for potential risk factor modification, dietary therapy or pharmacologic therapy may be warranted, if clinically indicated. Electronically Signed   By: Vinnie Langton M.D.   On: 08/14/2015 07:24   Dg Chest Port 1 View  08/14/2015  CLINICAL DATA:  Status post right-sided thoracentesis EXAM: PORTABLE CHEST - 1 VIEW COMPARISON:  08/13/2015 FINDINGS: Cardiac shadow is stable. The lungs are well aerated bilaterally. Persistent infiltrate is noted throughout the mid right lung with cavitary area superiorly similar to that noted on prior examinations. No pneumothorax is noted following thoracentesis. Note is made of a previously placed esophageal stent which now lies within the stomach. IMPRESSION: Persistent infiltrative change and cavitary changes in the right lung. No pneumothorax is noted following thoracentesis. Electronically Signed   By:  Inez Catalina M.D.   On: 08/14/2015 14:56   I have personally reviewed and evaluated these images and lab results as part of my medical decision-making.   EKG Interpretation None      MDM   Final diagnoses:  HCAP (healthcare-associated pneumonia)  Hypokalemia    Patient here  with Low 02 sats, cough, fever. CXR shows multilobar pneumonia with area of cavitation in the R upper lobe questionable for region of disease sparing or possible abscess. patien tbegun on [place] and vanc. She will be admitted by Dr. Olevia Bowens for HCAP. Pt stable in ED with no significant deterioration in condition.    Margarita Mail, PA-C 08/14/15 [place], MD 08/15/15 [phone]

## 2015-08-13 NOTE — ED Notes (Signed)
Requested humidified o2 from RT.

## 2015-08-14 ENCOUNTER — Inpatient Hospital Stay (HOSPITAL_COMMUNITY): Payer: Medicare Other

## 2015-08-14 DIAGNOSIS — J9601 Acute respiratory failure with hypoxia: Secondary | ICD-10-CM

## 2015-08-14 DIAGNOSIS — A419 Sepsis, unspecified organism: Secondary | ICD-10-CM

## 2015-08-14 DIAGNOSIS — J850 Gangrene and necrosis of lung: Secondary | ICD-10-CM

## 2015-08-14 DIAGNOSIS — D509 Iron deficiency anemia, unspecified: Secondary | ICD-10-CM

## 2015-08-14 DIAGNOSIS — J90 Pleural effusion, not elsewhere classified: Secondary | ICD-10-CM | POA: Insufficient documentation

## 2015-08-14 DIAGNOSIS — J189 Pneumonia, unspecified organism: Secondary | ICD-10-CM

## 2015-08-14 LAB — COMPREHENSIVE METABOLIC PANEL
ALK PHOS: 217 U/L — AB (ref 38–126)
ALT: 10 U/L — AB (ref 14–54)
AST: 14 U/L — AB (ref 15–41)
Albumin: 1.3 g/dL — ABNORMAL LOW (ref 3.5–5.0)
Anion gap: 9 (ref 5–15)
BUN: 9 mg/dL (ref 6–20)
CALCIUM: 7.8 mg/dL — AB (ref 8.9–10.3)
CHLORIDE: 104 mmol/L (ref 101–111)
CO2: 25 mmol/L (ref 22–32)
CREATININE: 0.49 mg/dL (ref 0.44–1.00)
Glucose, Bld: 59 mg/dL — ABNORMAL LOW (ref 65–99)
Potassium: 3.3 mmol/L — ABNORMAL LOW (ref 3.5–5.1)
Sodium: 138 mmol/L (ref 135–145)
Total Bilirubin: 0.3 mg/dL (ref 0.3–1.2)
Total Protein: 4.5 g/dL — ABNORMAL LOW (ref 6.5–8.1)

## 2015-08-14 LAB — CBC WITH DIFFERENTIAL/PLATELET
BASOS PCT: 0 %
Basophils Absolute: 0 10*3/uL (ref 0.0–0.1)
EOS ABS: 0.1 10*3/uL (ref 0.0–0.7)
EOS PCT: 1 %
HCT: 24.7 % — ABNORMAL LOW (ref 36.0–46.0)
Hemoglobin: 7.4 g/dL — ABNORMAL LOW (ref 12.0–15.0)
LYMPHS ABS: 0.5 10*3/uL — AB (ref 0.7–4.0)
Lymphocytes Relative: 4 %
MCH: 24 pg — AB (ref 26.0–34.0)
MCHC: 30 g/dL (ref 30.0–36.0)
MCV: 80.2 fL (ref 78.0–100.0)
MONOS PCT: 5 %
Monocytes Absolute: 0.6 10*3/uL (ref 0.1–1.0)
NEUTROS PCT: 90 %
Neutro Abs: 10.5 10*3/uL — ABNORMAL HIGH (ref 1.7–7.7)
PLATELETS: 522 10*3/uL — AB (ref 150–400)
RBC: 3.08 MIL/uL — ABNORMAL LOW (ref 3.87–5.11)
RDW: 16.2 % — ABNORMAL HIGH (ref 11.5–15.5)
WBC: 11.7 10*3/uL — AB (ref 4.0–10.5)

## 2015-08-14 LAB — LEGIONELLA ANTIGEN, URINE

## 2015-08-14 LAB — PROTEIN, TOTAL: Total Protein: 4.4 g/dL — ABNORMAL LOW (ref 6.5–8.1)

## 2015-08-14 LAB — IRON AND TIBC
Iron: 5 ug/dL — ABNORMAL LOW (ref 28–170)
SATURATION RATIOS: 3 % — AB (ref 10.4–31.8)
TIBC: 160 ug/dL — AB (ref 250–450)
UIBC: 155 ug/dL

## 2015-08-14 LAB — LACTATE DEHYDROGENASE: LDH: 226 U/L — AB (ref 98–192)

## 2015-08-14 LAB — BODY FLUID CELL COUNT WITH DIFFERENTIAL
Lymphs, Fluid: 1 %
NEUTROPHIL FLUID: 99 % — AB (ref 0–25)
WBC FLUID: 5894 uL — AB (ref 0–1000)

## 2015-08-14 LAB — HIV ANTIBODY (ROUTINE TESTING W REFLEX): HIV SCREEN 4TH GENERATION: NONREACTIVE

## 2015-08-14 LAB — LACTATE DEHYDROGENASE, PLEURAL OR PERITONEAL FLUID: LD FL: 155 U/L — AB (ref 3–23)

## 2015-08-14 LAB — PROTEIN, BODY FLUID: Total protein, fluid: <3 g/dL

## 2015-08-14 LAB — MRSA PCR SCREENING: MRSA BY PCR: POSITIVE — AB

## 2015-08-14 LAB — CHOLESTEROL, TOTAL: Cholesterol: 70 mg/dL (ref 0–200)

## 2015-08-14 LAB — FERRITIN: FERRITIN: 85 ng/mL (ref 11–307)

## 2015-08-14 MED ORDER — POTASSIUM CHLORIDE CRYS ER 20 MEQ PO TBCR
20.0000 meq | EXTENDED_RELEASE_TABLET | Freq: Once | ORAL | Status: DC
  Filled 2015-08-14: qty 1

## 2015-08-14 MED ORDER — KETOROLAC TROMETHAMINE 15 MG/ML IJ SOLN
15.0000 mg | Freq: Once | INTRAMUSCULAR | Status: AC
  Filled 2015-08-14 (×2): qty 1

## 2015-08-14 MED ORDER — KETOROLAC TROMETHAMINE 15 MG/ML IJ SOLN
15.0000 mg | Freq: Once | INTRAMUSCULAR | Status: AC
  Administered 2015-08-14: 15 mg via INTRAVENOUS

## 2015-08-14 NOTE — ED Notes (Signed)
MD at bedside. 

## 2015-08-14 NOTE — Progress Notes (Signed)
Patient tearful - states her vein is very painful. States painful since she received IV K+. Stopped IV NS w/ KCl 40 meq d/t pain. Will notify MD.

## 2015-08-14 NOTE — ED Notes (Signed)
Dr. Carles Collet advised patient does not need to be on airborne precautions and can be placed on droplet precautions.

## 2015-08-14 NOTE — Progress Notes (Signed)
PROGRESS NOTE  Autumn Turner [id number] DOB: December 19, 1960 DOA: 08/13/2015 PCP: Robyn Haber, MD  Brief History 54 year old female with a history of gastric bypass, peptic ulcer disease, hypertension, anxiety presented with one-week history of productive cough, dyspnea, fatigue, and subjective fever. The patient has had intermittent nausea and vomiting with her last episode on 08/10/2015. Patient denies any headache, neck pain, hemoptysis, chest pain, dysuria, hematuria, hematochezia, melena. Patient denies any recent sick contacts. Upon presentation, chest x-ray revealed multifocal consolidations right greater than left lung. She was noted to have WBC 16.4 and lactic acid 1.22. The patient was started on intravenous antibiotic and culture data. Assessment/Plan: Sepsis -Secondary to necrotizing pneumonia -Continue intravenous antibiotic pending culture data -Continue intravenous fluids Necrotizing pneumonia -Developed and TB given the acute nature of the patient's presentation -Cannot rule out nontuberculous mycobacteria -Also suspected degree of aspiration pneumonitis given the patient's anastomotic stricture and intermittent vomiting -08/13/2015 CT chest multilobar infiltrates, right greater than left with area of necrosis in the right upper lobe apex -Consulted pulmonary--?bronchoscopy for BAL for AFB -MRSA nasal swab is positive -Remain in negative pressure isolation for now pending further evaluation and clarity of the patient's clinical progress -Check QuantiFERON Gold Acute respiratory failure with hypoxia -Secondary to pneumonia in the setting of underlying COPD -Percent stable on 3 L nasal cannula -Start BDs Chronic abdominal pain -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, last performed 07/17/2015 -Presently with minimal abdominal pain and without vomiting  Hypokalemia  -Replete  -Check magnesium  Microcytic  anemia -Check iron studies Thrombocytosis -Related to acute phase reaction as well as suspected iron deficiency anemia   Family Communication:   Pt at beside Disposition Plan:   Home when medically stable Total time [phone]      Procedures/Studies: Dg Chest 2 View  08/13/2015  CLINICAL DATA:  54 year old female with productive cough and recent fevers EXAM: CHEST  2 VIEW COMPARISON:  Most recent prior chest x-ray 12/03/2012 ; abdominal radiograph including the lower lungs 07/31/2015 FINDINGS: Interval development of multifocal patchy ground-glass attenuation opacity with areas of increased confluence in the right upper, right middle and right lower lobes. More mild patchy airspace opacity is also present within the left lower lobe. There are small bilateral pleural effusions. A rounded lucency in the right lung apex raises concern for underlying cavitation or bulla formation. The parenchymal findings represent a dramatic change compared to the relatively recent abdominal radiographs dated 07/31/2015. Incompletely imaged stent again noted over the left upper abdomen. No acute osseous abnormality. IMPRESSION: 1. Interval development of multifocal airspace consolidation throughout the right lung, and to a lesser extent in the left lower lobe compared to 07/31/2015. Findings are concerning for multi lobar pneumonia, or possibly aspiration given the presence of the esophageal stent. 2. A rounded lucency overlying the right lung apex may represent a geographic region of relatively spared lung, or possibly developing cavitation or bulla formation. Recommend attention on follow-up chest radiographs. If the abnormality persists, CT scan of the chest may become warranted. 3. Small bilateral pleural effusions. Electronically Signed   By: Jacqulynn Cadet M.D.   On: 08/13/2015 16:31   Ct Chest Wo Contrast  08/14/2015  CLINICAL DATA:  54 year old female with generalized fatigue and productive cough (clear  sputum) presenting with shortness of breath. Abnormal chest x-ray concerning for multilobar pneumonia. EXAM: CT CHEST WITHOUT CONTRAST TECHNIQUE: Multidetector CT imaging of the chest was performed following  the standard protocol without IV contrast. COMPARISON:  No priors. FINDINGS: Mediastinum/Lymph Nodes: Heart size is normal. Small amount of pericardial fluid and/or thickening, unlikely to be of any hemodynamic significance at this time. No associated pericardial calcification. There is atherosclerosis of the thoracic aorta, the great vessels of the mediastinum and the coronary arteries, including calcified atherosclerotic plaque in the left main, left anterior descending, left circumflex and right coronary arteries. Soft tissue fullness in the right hilar region likely reflects underlying lymphadenopathy (assessment is limited on today's noncontrast CT examination). Multiple borderline enlarged right paratracheal lymph nodes are also noted measuring up to 9 mm in short axis. Esophagus is unremarkable in appearance. No axillary lymphadenopathy. Lungs/Pleura: 4.5 x 4.8 cm cavity in the apex of the right upper lobe in the midst of an area of extensive airspace consolidation. Widespread airspace consolidation is noted throughout the right lung, with patchy areas of ground-glass attenuation also noted throughout the left lung, presumably indicative of early endobronchial spread of infection into the contralateral lung. Large right and small left pleural effusions lying dependently. Upper Abdomen: Postoperative changes in the proximal stomach incompletely visualized, but likely related to prior gastric bypass procedure. There is a stent present in the proximal stomach. Musculoskeletal/Soft Tissues: There are no aggressive appearing lytic or blastic lesions noted in the visualized portions of the skeleton. IMPRESSION: 1. Severe multilobar pneumonia in the lungs bilaterally (right greater than left), with large area of  necrosis in the apex of the right upper lobe, and bilateral parapneumonic pleural effusions (right greater than left). 2. Large stent incompletely visualized in the stomach. This is unusual in appearance, and is favored to represent a distally migrated esophageal stent. Clinical correlation is recommended. 3. Prominent soft tissue in the right hilar region likely reflects underlying adenopathy, presumably reactive. There also multiple borderline enlarged right paratracheal lymph nodes. 4. Atherosclerosis, including left main and 3 vessel coronary artery disease. Please note that although the presence of coronary artery calcium documents the presence of coronary artery disease, the severity of this disease and any potential stenosis cannot be assessed on this non-gated CT examination. Assessment for potential risk factor modification, dietary therapy or pharmacologic therapy may be warranted, if clinically indicated. Electronically Signed   By: Vinnie Langton M.D.   On: 08/14/2015 07:24   Dg Abd 2 Views  07/31/2015  CLINICAL DATA:  Gastric outlet stenosis . EXAM: ABDOMEN - 2 VIEW COMPARISON:  CT 08/27/2009 . FINDINGS: Gastric stent is noted coursing vertically over the region of the stomach. Clinical correlation suggested. Prior gastric surgery. Surgical clips right upper quadrant. No bowel distention. No gastric distention. No free air. IMPRESSION: Gastric stent is noted coursing vertically over the region of the stomach. Prior gastric surgery. No gastric distention. Electronically Signed   By: Marcello Moores  Register   On: 07/31/2015 11:32   Dg Esophagus Dilatation  07/17/2015  CLINICAL DATA:  ESOPHAGEAL DILATATION Fluoroscopy was provided for use by the requesting physician.  No images were obtained for radiographic interpretation.        Subjective:  patient complains of yellow productive cough without hemoptysis. She still remains dyspneic but better than yesterday. Denies any nausea, vomiting,  diarrhea, vomiting, dysuria, hematuria. No headache or neck pain.   Objective: Filed Vitals:   08/14/15 0735 08/14/15 0737 08/14/15 0801 08/14/15 1000  BP: 128/75 128/75 140/78 132/89  Pulse: 95 93  99  Temp:      TempSrc:      Resp: 15 16  18   Height:  Weight:      SpO2: 92% 96%  94%    Intake/Output Summary (Last 24 hours) at 08/14/15 1027 Last data filed at 08/14/15 0932  Gross per 24 hour  Intake    243 ml  Output      0 ml  Net    243 ml   Weight change:  Exam:   General:  Pt is alert, follows commands appropriately, not in acute distress  HEENT: No icterus, No thrush, No neck mass, [place]/AT  Cardiovascular: RRR, S1/S2, no rubs, no gallops  Respiratory: with basilar rhonchi right greater than left. No wheeze. Good air movement   Abdomen: Soft/+BS, , non distended, no guarding; minimal epigastric tenderness without any rebound   Extremities: No edema, No lymphangitis, No petechiae, No rashes, no synovitis; clubbing without cyanosis   Data Reviewed: Basic Metabolic Panel:  Recent Labs Lab 08/13/15 1550 08/13/15 2146 08/14/15 0614  NA 141  --  138  K 2.6*  --  3.3*  CL 103  --  104  CO2 29  --  25  GLUCOSE 65  --  59*  BUN 13  --  9  CREATININE 0.52  --  0.49  CALCIUM 8.2*  --  7.8*  MG  --  2.0  --   PHOS  --  3.4  --    Liver Function Tests:  Recent Labs Lab 08/14/15 0614  AST 14*  ALT 10*  ALKPHOS 217*  BILITOT 0.3  PROT 4.5*  ALBUMIN 1.3*   No results for input(s): LIPASE, AMYLASE in the last 168 hours. No results for input(s): AMMONIA in the last 168 hours. CBC:  Recent Labs Lab 08/13/15 1550 08/14/15 0614  WBC 16.4* 11.7*  NEUTROABS 15.2* 10.5*  HGB 8.2* 7.4*  HCT 27.2* 24.7*  MCV 80.2 80.2  PLT 596* 522*   Cardiac Enzymes: No results for input(s): CKTOTAL, CKMB, CKMBINDEX, TROPONINI in the last 168 hours. BNP: Invalid input(s): POCBNP CBG: No results for input(s): GLUCAP in the last 168 hours.  Recent Results (from  the past 240 hour(s))  Culture, respiratory (NON-Expectorated)     Status: None (Preliminary result)   Collection Time: 08/13/15 10:12 PM  Result Value Ref Range Status   Specimen Description EXPECTORATED SPUTUM  Final   Special Requests NONE  Final   Gram Stain   Final    FEW WBC PRESENT, PREDOMINANTLY PMN NO SQUAMOUS EPITHELIAL CELLS SEEN ABUNDANT GRAM POSITIVE COCCI IN PAIRS IN CLUSTERS FEW GRAM POSITIVE RODS Performed at [facility]    Culture PENDING  Incomplete   Report Status PENDING  Incomplete  MRSA PCR Screening     Status: Abnormal   Collection Time: 08/13/15 10:16 PM  Result Value Ref Range Status   MRSA by PCR POSITIVE (A) NEGATIVE Final    Comment:        The GeneXpert MRSA Assay (FDA approved for NASAL specimens only), is one component of a comprehensive MRSA colonization surveillance program. It is not intended to diagnose MRSA infection nor to guide or monitor treatment for MRSA infections. RESULT CALLED TO, READ BACK BY AND VERIFIED WITH: T PHILLIPS @0108  08/14/15 MKELLY      Scheduled Meds: . buPROPion  300 mg Oral Daily  . cholecalciferol  1,000 Units Oral Daily  . clidinium-chlordiazePOXIDE  1 capsule Oral TID AC & HS  . ipratropium-albuterol  3 mL Nebulization Q6H  . pantoprazole (PROTONIX) IV  40 mg Intravenous Q24H  . sodium chloride  3 mL Intravenous  Q12H  . vitamin B-12  1,000 mcg Oral Daily  . vitamin C  500 mg Oral Daily   Continuous Infusions: . 0.9 % NaCl with KCl 40 mEq / L Stopped (08/14/15 1006)  . cefTAZidime (FORTAZ)  IV 2 g (08/14/15 0940)  . vancomycin Stopped (08/13/15 1830)     Milea Klink, DO  [facility] Pager [phone]  If 7PM-7AM, please contact night-coverage [email or web address] Password [email or web address] 08/14/2015, 10:27 AM   LOS: 1 day

## 2015-08-14 NOTE — Progress Notes (Signed)
Lab called and requested clarification fungal stain or fungal culture for pleural fluid. Dr. Nelda Marseille stated fungal culture. Notified Lab, tech requested this RN put in fungal culture.

## 2015-08-14 NOTE — Consult Note (Signed)
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 Most recently underwent balloon dilation mid Nov 2016 w/ improvement, but not resolution of vomiting. Admitted 12/12 w/ what was identified as necrotizing PNA. PCCM asked to assess.   SIGNIFICANT EVENTS    STUDIES:  CT chest 12/13:Severe multilobar pneumonia in the lungs bilaterally (right greater than left), with large area of necrosis in the apex of theright upper lobe, and bilateral parapneumonic pleural effusions (right greater than left). Large stent incompletely visualized in the stomach.underlying adenopathy, presumably reactive. There also multipleborderline enlarged right paratracheal lymph nodes. Atherosclerosis, including left main and 3 vessel coronary artery disease.   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 Most recently underwent balloon dilation mid Nov 2016 w/ improvement, but not resolution of vomiting. Began feeling ill around 12/3 w/ onset of fever, chills myalgias. This progressed to cough, not able to report color of sputum as often would gag and vomit from this. Symptoms continued to worsen w/ associated right sided CP, decreased PO intake ans shortness of breath. She presented to the ER on 12/12 as she was to weak to move. CXR suggesting bilateral PNA R>L w/ cavitary changes. CT of chest obtained to further eval, abx were started. CT finding noted bilateral PNA w/ area of central necrosis on right. Also R>L effusions. PCCM was asked to see re: CT findings and specifically question about Tb.  Admitted 12/12 w/ what was identified as necrotizing PNA. PCCM asked to assess.  PAST MEDICAL HISTORY :   has a past medical history of Abnormal Pap smear; Vulvar lesion; CIN II (cervical intraepithelial neoplasia II); Malnourished ([place]); Sleep apnea; Cancer ([place]); Anemia; Hypertension; Anxiety; Depression; and History of stomach ulcers.  has past surgical history that includes Cesarean section; Gastric bypass (2001); Gallbladder surgery (2002); tummy tuck (2004); LEEP (06/2009); Cholecystectomy; Tonsillectomy; Vulvectomy (Bilateral, 12/07/2012); revision to gastric bypass; Esophagogastroduodenoscopy (N/A, 06/29/2015); Balloon dilation (N/A, 06/29/2015); Esophagogastroduodenoscopy (egd) with propofol (N/A, 07/17/2015); Balloon dilation (N/A, 07/17/2015); and esophageal stent placement (N/A, 07/17/2015). Prior to Admission medications   Medication Sig Start Date End Date Taking? Authorizing Provider  buPROPion (WELLBUTRIN XL) 150 MG 24 hr tablet Take 300 mg by mouth daily.   Yes Historical Provider, MD  cholecalciferol (VITAMIN D) 1000 UNITS tablet Take 1,000 Units by mouth daily.   Yes Historical Provider, MD  clidinium-chlordiazePOXIDE (LIBRAX) 5-2.5 MG capsule Take 1 capsule by mouth 4 (four) times daily -  before meals and at bedtime. 08/01/15  Yes Historical Provider, MD  diphenhydrAMINE (BENADRYL) 12.5 MG chewable tablet Chew 12.5 mg by mouth 4 (four) times daily as needed for allergies.   Yes Historical Provider, MD  esomeprazole (NEXIUM) 20 MG capsule Take 20 mg by mouth daily.   Yes Historical Provider, MD  HYDROcodone-acetaminophen (NORCO/VICODIN) 5-325 MG tablet Take 1 tablet by mouth daily as needed. 07/17/15  Yes Historical Provider, MD  loratadine (CLARITIN) 10 MG tablet Take 10 mg by mouth daily as needed for allergies or rhinitis.    Yes Historical Provider, MD  Multiple Vitamins-Minerals (MULTIVITAMIN PO) Take 1 tablet by mouth daily.  Yes Historical Provider, MD  Tetrahydrozoline HCl  (VISINE OP) Place 1-2 drops into both eyes as needed (for dry eyes).   Yes Historical Provider, MD  traMADol (ULTRAM) 50 MG tablet TAKE 1 TABLET BY MOUTH THREE TIMES DAILY AS NEEDED Patient taking differently: Take 50 mg by mouth every 8 (eight) hours as needed for severe pain. TAKE 1 TABLET BY MOUTH THREE TIMES DAILY AS NEEDED 01/25/15  Yes Robyn Haber, MD  vitamin B-12 (CYANOCOBALAMIN) 1000 MCG tablet Take 1,000 mcg by mouth daily.   Yes Historical Provider, MD  vitamin C (ASCORBIC ACID) 500 MG tablet Take 500 mg by mouth daily.   Yes Historical Provider, MD  buPROPion (WELLBUTRIN XL) 300 MG 24 hr tablet Take 1 tablet (300 mg total) by mouth daily. PATIENT NEEDS OFFICE VISIT FOR ADDITIONAL REFILLS 08/10/15   Robyn Haber, MD   Allergies  Allergen Reactions  . Ciprocinonide [Fluocinolone] Hives and Rash  . Ciprofloxacin Other (See Comments)    Blisters, boils  . Dust Mite Extract Itching  . Iohexol Hives       . Nsaids Other (See Comments)    Due to Hx of gastric bypass    FAMILY HISTORY:  family history includes Cancer in her father; Hypertension in her father; Lung cancer in her father. SOCIAL HISTORY:  reports that she has been smoking Cigarettes.  She has a 10 pack-year smoking history. She has never used smokeless tobacco. She reports that she does not drink alcohol or use illicit drugs.  REVIEW OF SYSTEMS:   Constitutional: fevers from 12/3 to 12/10, + chills, + weight loss-->had initially gained weight after balloon procedure in Nov. + malaise/+fatigue  HENT: Negative for hearing loss, ear pain, nosebleeds, congestion, sore throat, neck pain, tinnitus and ear discharge.  Eyes: Negative for blurred vision, double vision, photophobia, pain, discharge and redness. Respiratory: + cough w/ difficulty producing mucous as she would gag and vomit w/ thick mucous. This started 12/7 and has been on-going, no hemoptysis, shortness of breath, no wheezing and stridor.  Increased chest  congestion on 12/10 w/ associated right sided chest discomfort and exertional SOB Cardiovascular: Negative for chest pain, palpitations, orthopnea, claudication, leg swelling and PND.  Gastrointestinal: Negative for heartburn, nausea, + vomiting-->this is a chronic issue, but it has improved c/w prior to last balloon procedure, abdominal pain, diarrhea, constipation, blood in stool and melena.  Genitourinary: Negative for dysuria, urgency, frequency, hematuria and flank pain.  Musculoskeletal: Negative for myalgias, back pain, + joint pain and falls. Skin: Negative for itching and rash.  Neurological: Negative for dizziness, tingling, tremors, sensory change, speech change, focal weakness, seizures, loss of consciousness, weakness and headaches.  Endo/Heme/Allergies: Negative for environmental allergies and polydipsia. Does not bruise/bleed easily.  SUBJECTIVE:   VITAL SIGNS: Temp:  [98.5 F (36.9 C)-98.8 F (37.1 C)] 98.5 F (36.9 C) (12/13 1120) Pulse Rate:  [88-103] 99 (12/13 1130) Resp:  [9-28] 16 (12/13 1130) BP: (118-152)/(68-98) 152/95 mmHg (12/13 1130) SpO2:  [88 %-100 %] 96 % (12/13 1130) Weight:  [41.277 kg (91 lb)] 41.277 kg (91 lb) (12/12 1534) 5 liters  PHYSICAL EXAMINATION: General:  Chronically ill appearing white female, lying in bed, not in acute distress Neuro:  Awake, alert, no focal weakness or neurological def. HEENT:  Some temporal wasting, edentulous, no JVD Cardiovascular:  rrr w/out MRG Lungs:  Bilateral R>L rales, + tactile frem on right. Scattered rhonchi on right after cough Abdomen:  Soft, not tender, + bowels sounds w/out organomegally  Musculoskeletal:  Generalized weakness, but equal st and tone Skin:  Warm, dry and intact    Recent Labs Lab 08/13/15 1550 08/14/15 0614  NA 141 138  K 2.6* 3.3*  CL 103 104  CO2 29 25  BUN 13 9  CREATININE 0.52 0.49  GLUCOSE 65 59*    Recent Labs Lab 08/13/15 1550 08/14/15 0614  HGB 8.2* 7.4*  HCT 27.2*  24.7*  WBC 16.4* 11.7*  PLT 596* 522*   Dg Chest 2 View  08/13/2015  CLINICAL DATA:  54 year old female with productive cough and recent fevers EXAM: CHEST  2 VIEW COMPARISON:  Most recent prior chest x-ray 12/03/2012 ; abdominal radiograph including the lower lungs 07/31/2015 FINDINGS: Interval development of multifocal patchy ground-glass attenuation opacity with areas of increased confluence in the right upper, right middle and right lower lobes. More mild patchy airspace opacity is also present within the left lower lobe. There are small bilateral pleural effusions. A rounded lucency in the right lung apex raises concern for underlying cavitation or bulla formation. The parenchymal findings represent a dramatic change compared to the relatively recent abdominal radiographs dated 07/31/2015. Incompletely imaged stent again noted over the left upper abdomen. No acute osseous abnormality. IMPRESSION: 1. Interval development of multifocal airspace consolidation throughout the right lung, and to a lesser extent in the left lower lobe compared to 07/31/2015. Findings are concerning for multi lobar pneumonia, or possibly aspiration given the presence of the esophageal stent. 2. A rounded lucency overlying the right lung apex may represent a geographic region of relatively spared lung, or possibly developing cavitation or bulla formation. Recommend attention on follow-up chest radiographs. If the abnormality persists, CT scan of the chest may become warranted. 3. Small bilateral pleural effusions. Electronically Signed   By: Jacqulynn Cadet M.D.   On: 08/13/2015 16:31   Ct Chest Wo Contrast  08/14/2015  CLINICAL DATA:  54 year old female with generalized fatigue and productive cough (clear sputum) presenting with shortness of breath. Abnormal chest x-ray concerning for multilobar pneumonia. EXAM: CT CHEST WITHOUT CONTRAST TECHNIQUE: Multidetector CT imaging of the chest was performed following the standard  protocol without IV contrast. COMPARISON:  No priors. FINDINGS: Mediastinum/Lymph Nodes: Heart size is normal. Small amount of pericardial fluid and/or thickening, unlikely to be of any hemodynamic significance at this time. No associated pericardial calcification. There is atherosclerosis of the thoracic aorta, the great vessels of the mediastinum and the coronary arteries, including calcified atherosclerotic plaque in the left main, left anterior descending, left circumflex and right coronary arteries. Soft tissue fullness in the right hilar region likely reflects underlying lymphadenopathy (assessment is limited on today's noncontrast CT examination). Multiple borderline enlarged right paratracheal lymph nodes are also noted measuring up to 9 mm in short axis. Esophagus is unremarkable in appearance. No axillary lymphadenopathy. Lungs/Pleura: 4.5 x 4.8 cm cavity in the apex of the right upper lobe in the midst of an area of extensive airspace consolidation. Widespread airspace consolidation is noted throughout the right lung, with patchy areas of ground-glass attenuation also noted throughout the left lung, presumably indicative of early endobronchial spread of infection into the contralateral lung. Large right and small left pleural effusions lying dependently. Upper Abdomen: Postoperative changes in the proximal stomach incompletely visualized, but likely related to prior gastric bypass procedure. There is a stent present in the proximal stomach. Musculoskeletal/Soft Tissues: There are no aggressive appearing lytic or blastic lesions noted in the visualized portions of the skeleton. IMPRESSION: 1. Severe multilobar pneumonia in the lungs  bilaterally (right greater than left), with large area of necrosis in the apex of the right upper lobe, and bilateral parapneumonic pleural effusions (right greater than left). 2. Large stent incompletely visualized in the stomach. This is unusual in appearance, and is favored  to represent a distally migrated esophageal stent. Clinical correlation is recommended. 3. Prominent soft tissue in the right hilar region likely reflects underlying adenopathy, presumably reactive. There also multiple borderline enlarged right paratracheal lymph nodes. 4. Atherosclerosis, including left main and 3 vessel coronary artery disease. Please note that although the presence of coronary artery calcium documents the presence of coronary artery disease, the severity of this disease and any potential stenosis cannot be assessed on this non-gated CT examination. Assessment for potential risk factor modification, dietary therapy or pharmacologic therapy may be warranted, if clinically indicated. Electronically Signed   By: Vinnie Langton M.D.   On: 08/14/2015 07:24    ASSESSMENT / PLAN:  Acute Hypoxic Respiratory failure in setting of Necrotizing PNA This is unlikely Tb, given rather acute progression would favor staph high on list of diff -dx. Unclear if this is directly the result of what is likely chronic aspiration, but also would be included as possibility.  Mod right and small left pleural effusions. -->likely parapneumonic   Plan F/u culture data-->don't think bronch indicated at this time as clinically she already feels better and she did produce sputum F/u Quantiferon gold  Will d/w Dr Nelda Marseille but will need to look at right chest and consider thoracentesis Agree w/ continuing Nosocomial coverage: Will require long term abx (hopefully can be directed by culture data), this would likely be at least 4-6 weeks; at which time she would require radiographic f/u as these should be continued until there is radiographic resolution of PNA  H/o gastric bypass surgery c/b gastric outlet obstruction resulting in intractable vomiting; malnutrition and Chronic aspiration. After her last balloon dilation by Dr Watt Climes she had remarkable improvement, but not resolution of vomiting Plan Consider GI  f/u--.would atleast notify [facility] of admit   Microcytic anemia Plan  Per IM service   Erick Colace ACNP-BC [place] Pager # [phone] OR # [phone] if no answer  08/14/2015, 11:44 AM  Attending Note:  54 year old female who had a gastric bypass surgery and developed outlet obstruction and subsequently has been malnutritioned and sickly who presents to the hospital with necrotizing PNA of the right lung.  On exam, dullness to percussion at the right base.  I performed an U/S on the right chest and found area suitable for a thora.  I reviewed chest CT myself, necrotizing areas noted, highly unlikely to be TB.  Discussed with [facility] MD and PCCM-NP.  Necrotizing PNA: likely staph or strep related.  - Vanc in case MRSA.  - Ceftaz as above.  - Cultures as ordered.  - No bronch.  - Sputum for AFB.  - Quantiferon gold.  - Will need 4-6 wks of abx pending cultures.  Pleural effusion: likely parapneumonic in nature.  - U/S of the chest.  - Thora today.  - Fluid for analysis.  - Pleural fluid culture.  Hypoxemia: due to PNA  - Titrate O2 for sat of 88-92%.  - Will need an ambulatory desat study prior to discharge.  Malnutrition: I am afraid that this the cause for her infections.  May need to consider IV nutrition if unable to resolve PO intake issue delineated above.  Patient seen and examined, agree with above note.  I dictated the care and orders written for this patient under my direction.  Wesam G Yacoub, MD [phone] 

## 2015-08-14 NOTE — ED Notes (Signed)
Patient CBG was 73, the Nurse was informed.

## 2015-08-14 NOTE — Progress Notes (Signed)
UR Completed. Ines Rebel, RN, BSN.  [phone] 

## 2015-08-14 NOTE — ED Notes (Signed)
Kim in flow advised that patient no longer needs a negative pressure room, per MD's orders.

## 2015-08-14 NOTE — Procedures (Signed)
Thoracentesis Procedure Note  Pre-operative Diagnosis: right pleural effusion   Post-operative Diagnosis: same  Indications: pneumonia; evaluate pleural fluid and r/o empyema   Procedure Details  Consent: Informed consent was obtained. Risks of the procedure were discussed including: infection, bleeding, pain, pneumothorax.  Under sterile conditions the patient was positioned. Betadine solution and sterile drapes were utilized.  1% buffered lidocaine was used to anesthetize the pleural space which was identified via real time [place]. Fluid was obtained without any difficulties and minimal blood loss.  A dressing was applied to the wound and wound care instructions were provided.   Findings 580 ml of cloudy pleural fluid was obtained. A sample was sent to Pathology for cytogenetics, flow, and cell counts, as well as for infection analysis.  Complications:  None; patient tolerated the procedure well.          Condition: stable  Plan A follow up chest x-ray was ordered. Bed Rest for 0 hours. Tylenol 650 mg. for pain.  Done under U/S guidance.  I was present and supervised the entire procedure.  Rush Farmer, M.D. [facility] Pulmonary/Critical Care Medicine. Pager: [phone]. After hours pager: [phone].

## 2015-08-14 NOTE — ED Notes (Signed)
Patient  transported to CT via hospital bed.  

## 2015-08-14 NOTE — ED Notes (Signed)
PA At bedside

## 2015-08-14 NOTE — ED Notes (Addendum)
Patient was given a coke. A liquid diet ordered for patient.

## 2015-08-14 NOTE — Progress Notes (Signed)
Notified Katharine Look, RN that patient requires Airborne/Contact precautions r/t AFB and Quanteferon Gold tests in process, pos MRSA pcr.  Also, note by Dr. Carles Collet to continue negative pressure isolation until results known.  Reconfirmed with Ronalee Belts, charge nurse, that airborne precautions indicated.  Jackalyn Lombard, RN  Infection Prevention

## 2015-08-14 NOTE — ED Notes (Addendum)
Pt had removed n/c from nose - RN replaced and asked pt to keep in place. Pt alert, oriented. Pt given cup for sputum. Voiced understanding. Monitor intact to pt.

## 2015-08-14 NOTE — Progress Notes (Signed)
Report called to Lily Peer, RN r/t patient update for transfer.

## 2015-08-15 DIAGNOSIS — J15211 Pneumonia due to Methicillin susceptible Staphylococcus aureus: Secondary | ICD-10-CM

## 2015-08-15 DIAGNOSIS — J948 Other specified pleural conditions: Secondary | ICD-10-CM

## 2015-08-15 LAB — CBC WITH DIFFERENTIAL/PLATELET
BASOS ABS: 0 10*3/uL (ref 0.0–0.1)
BASOS PCT: 0 %
EOS ABS: 0.1 10*3/uL (ref 0.0–0.7)
Eosinophils Relative: 1 %
HCT: 22 % — ABNORMAL LOW (ref 36.0–46.0)
HEMOGLOBIN: 6.9 g/dL — AB (ref 12.0–15.0)
Lymphocytes Relative: 3 %
Lymphs Abs: 0.3 10*3/uL — ABNORMAL LOW (ref 0.7–4.0)
MCH: 25 pg — ABNORMAL LOW (ref 26.0–34.0)
MCHC: 31.4 g/dL (ref 30.0–36.0)
MCV: 79.7 fL (ref 78.0–100.0)
MONOS PCT: 4 %
Monocytes Absolute: 0.4 10*3/uL (ref 0.1–1.0)
NEUTROS ABS: 9 10*3/uL — AB (ref 1.7–7.7)
NEUTROS PCT: 93 %
Platelets: 467 10*3/uL — ABNORMAL HIGH (ref 150–400)
RBC: 2.76 MIL/uL — AB (ref 3.87–5.11)
RDW: 16.5 % — ABNORMAL HIGH (ref 11.5–15.5)
WBC: 9.7 10*3/uL (ref 4.0–10.5)

## 2015-08-15 LAB — COMPREHENSIVE METABOLIC PANEL
ALK PHOS: 176 U/L — AB (ref 38–126)
ALT: 8 U/L — AB (ref 14–54)
ANION GAP: 7 (ref 5–15)
AST: 11 U/L — ABNORMAL LOW (ref 15–41)
Albumin: 1.1 g/dL — ABNORMAL LOW (ref 3.5–5.0)
BILIRUBIN TOTAL: 0.5 mg/dL (ref 0.3–1.2)
BUN: 5 mg/dL — ABNORMAL LOW (ref 6–20)
CALCIUM: 8.1 mg/dL — AB (ref 8.9–10.3)
CO2: 28 mmol/L (ref 22–32)
CREATININE: 0.49 mg/dL (ref 0.44–1.00)
Chloride: 104 mmol/L (ref 101–111)
Glucose, Bld: 86 mg/dL (ref 65–99)
Potassium: 3.1 mmol/L — ABNORMAL LOW (ref 3.5–5.1)
Sodium: 139 mmol/L (ref 135–145)
TOTAL PROTEIN: 4 g/dL — AB (ref 6.5–8.1)

## 2015-08-15 LAB — GLUCOSE, CAPILLARY: Glucose-Capillary: 73 mg/dL (ref 65–99)

## 2015-08-15 LAB — MAGNESIUM: MAGNESIUM: 1.6 mg/dL — AB (ref 1.7–2.4)

## 2015-08-15 MED ORDER — SODIUM CHLORIDE 0.9 % IV SOLN
510.0000 mg | Freq: Once | INTRAVENOUS | Status: AC
  Administered 2015-08-15: 510 mg via INTRAVENOUS
  Filled 2015-08-15: qty 17

## 2015-08-15 MED ORDER — MAGNESIUM SULFATE 2 GM/50ML IV SOLN
2.0000 g | Freq: Once | INTRAVENOUS | Status: AC
Start: 2015-08-15 — End: 2015-08-16
  Administered 2015-08-15: 2 g via INTRAVENOUS
  Filled 2015-08-15: qty 50

## 2015-08-15 MED ORDER — POTASSIUM CHLORIDE CRYS ER 20 MEQ PO TBCR: 40.0000 meq | EXTENDED_RELEASE_TABLET | Freq: Once | ORAL | Status: DC

## 2015-08-15 MED ORDER — POTASSIUM CHLORIDE 20 MEQ/15ML (10%) PO SOLN
40.0000 meq | Freq: Once | ORAL | Status: AC
  Administered 2015-08-15: 40 meq via ORAL
  Filled 2015-08-15: qty 30

## 2015-08-15 MED ORDER — ALBUTEROL SULFATE (2.5 MG/3ML) 0.083% IN NEBU: 2.5000 mg | INHALATION_SOLUTION | RESPIRATORY_TRACT | Status: DC | PRN

## 2015-08-15 MED ORDER — MUPIROCIN 2 % EX OINT
1.0000 "application " | TOPICAL_OINTMENT | Freq: Two times a day (BID) | CUTANEOUS | Status: AC
  Administered 2015-08-15 – 2015-08-19 (×10): 1 via NASAL
  Filled 2015-08-15 (×2): qty 22

## 2015-08-15 MED ORDER — CHLORHEXIDINE GLUCONATE CLOTH 2 % EX PADS
6.0000 | MEDICATED_PAD | Freq: Every day | CUTANEOUS | Status: AC
  Administered 2015-08-16 – 2015-08-20 (×5): 6 via TOPICAL

## 2015-08-15 NOTE — Progress Notes (Signed)
PULMONARY / CRITICAL CARE MEDICINE   Name: Autumn Turner MRN: [id number] DOB: Apr 25, 1961    ADMISSION DATE:  08/13/2015 CONSULTATION DATE:  08/14/2015  REFERRING MD:  [place]  CHIEF COMPLAINT:  Short of breath  SUBJECTIVE:  Still has cough with sputum.  Rt sided sore with deep breath and coughing.  No fever since admission.  VITAL SIGNS: BP 140/82 mmHg  Pulse 100  Temp(Src) 98 F (36.7 C) (Oral)  Resp 20  Ht 5\' 4"  (1.626 m)  Wt 108 lb 12.8 oz (49.351 kg)  BMI 18.67 kg/m2  SpO2 84%  INTAKE / OUTPUT: I/O last 3 completed shifts: In: 2911.3 [P.O.:720; I.V.:1591.3; IV Piggyback:600] Out: 350 [Urine:350]  PHYSICAL EXAMINATION: General: thin Neuro:  Alert, normal strength HEENT:  No sinus tenderness Cardiovascular:  regular Lungs:  B/l crackles Rt > Lt  Abdomen:  Soft, non tender Musculoskeletal:  Decreased muscle bulk Skin:  No rashes  LABS:  BMET  Recent Labs Lab 08/13/15 1550 08/14/15 0614 08/15/15 0626  NA 141 138 139  K 2.6* 3.3* 3.1*  CL 103 104 104  CO2 29 25 28   BUN 13 9 5*  CREATININE 0.52 0.49 0.49  GLUCOSE 65 59* 86    Electrolytes  Recent Labs Lab 08/13/15 1550 08/13/15 2146 08/14/15 0614 08/15/15 0626  CALCIUM 8.2*  --  7.8* 8.1*  MG  --  2.0  --  1.6*  PHOS  --  3.4  --   --     CBC  Recent Labs Lab 08/13/15 1550 08/14/15 0614 08/15/15 0626  WBC 16.4* 11.7* 9.7  HGB 8.2* 7.4* 6.9*  HCT 27.2* 24.7* 22.0*  PLT 596* 522* 467*    Coag's No results for input(s): APTT, INR in the last 168 hours.  Sepsis Markers  Recent Labs Lab 08/13/15 1606  LATICACIDVEN 1.22    ABG No results for input(s): PHART, PCO2ART, PO2ART in the last 168 hours.  Liver Enzymes  Recent Labs Lab 08/14/15 0614 08/15/15 0626  AST 14* 11*  ALT 10* 8*  ALKPHOS 217* 176*  BILITOT 0.3 0.5  ALBUMIN 1.3* 1.1*    Cardiac Enzymes No results for input(s): TROPONINI, PROBNP in the last 168 hours.  Glucose  Recent Labs Lab 08/14/15 1106   GLUCAP 73    Imaging Dg Chest Port 1 View  08/14/2015  CLINICAL DATA:  Status post right-sided thoracentesis EXAM: PORTABLE CHEST - 1 VIEW COMPARISON:  08/13/2015 FINDINGS: Cardiac shadow is stable. The lungs are well aerated bilaterally. Persistent infiltrate is noted throughout the mid right lung with cavitary area superiorly similar to that noted on prior examinations. No pneumothorax is noted following thoracentesis. Note is made of a previously placed esophageal stent which now lies within the stomach. IMPRESSION: Persistent infiltrative change and cavitary changes in the right lung. No pneumothorax is noted following thoracentesis. Electronically Signed   By: Inez Catalina M.D.   On: 08/14/2015 14:56     STUDIES:  12/13 CT chest >> multilobar PNA with areas of necrosis 12/13 Rt thoracentesis >> 580 ml fluid, protein < 3/4.4, LDH 155/226, WBC 5894 (99%N)  CULTURES: 12/12 Blood >>  12/12 Sputum >> Abundant Staph Aureus >>  12/13 Rt pleural fluid >>   ANTIBIOTICS: 12/12 Vancomycin >> 12/12 Fortaz >>   SIGNIFICANT EVENTS: 12/12 Admit  LINES/TUBES:  DISCUSSION: 54 yo female smoker with productive cough, fatigue, dyspnea, fever 103F from necrotizing PNA with sepsis and pleural effusion.  PCCM asked to assess pleural effusion.  ASSESSMENT / PLAN:  Acute hypoxic respiratory failure 2nd to Staph aureus multilobar, necrotizing pneumonia. Parapneumonia exudate Rt pleural effusion with neutrophil predominance. Plan: - day 2 of Abx >> narrow once cx results finalized >> defer to hospitalist team - f/u CXR >> if pleural effusion recurs, then consider more definitive intervention - doubt she has TB >> can d/c airborne precautions once sputum AFB results negative  Chesley Mires, MD [place] 08/15/2015, 11:22 AM Pager:  [phone] After 3pm call: [phone]

## 2015-08-15 NOTE — Progress Notes (Signed)
Patient refused librax verbalizing that it is not working.   Ave Filter, RN

## 2015-08-15 NOTE — Progress Notes (Signed)
PROGRESS NOTE  Autumn Turner [id number] DOB: Oct 04, 1960 DOA: 08/13/2015 PCP: Robyn Haber, MD  Brief History 54 year old female with a history of gastric bypass, peptic ulcer disease, hypertension, anxiety presented with one-week history of productive cough, dyspnea, fatigue, and subjective fever. The patient has had intermittent nausea and vomiting with her last episode on 08/10/2015. Patient denies any headache, neck pain, hemoptysis, chest pain, dysuria, hematuria, hematochezia, melena. Patient denies any recent sick contacts. Upon presentation, chest x-ray revealed multifocal consolidations right greater than left lung. She was noted to have WBC 16.4 and lactic acid 1.22. The patient was started on intravenous antibiotics pending culture data. Assessment/Plan: Sepsis -Secondary to necrotizing pneumonia -Continue intravenous antibiotics D#2 pending culture data -Continue intravenous fluids Sepsis physiology improved- Necrotizing pneumonia with parapneumonic effusion -Doubt TB given the acute nature of the patient's presentation -Cannot rule out nontuberculous mycobacteria -Also suspected aspiration pneumonitis given the patient's anastomotic stricture and intermittent vomiting -08/13/2015 CT chest multilobar infiltrates, right greater than left with area of necrosis in the right upper lobe apex -Appreciate pulmonary-->thoracocentesis on 08/14/15-->5894 WBC (99% PMN) -MRSA nasal swab is positive -Remain in negative pressure isolation until AFB sputum neg x 3 -Check QuantiFERON Gold -follow up pleural fluid culture and sputum culture and narrow abx Acute respiratory failure with hypoxia -Secondary to pneumonia in the setting of underlying COPD -Percent stable on 2 L nasal cannula -Start BDs Chronic abdominal pain -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, last performed 07/17/2015 -Presently with minimal abdominal  pain and without vomiting  -Continue full liquids, advance as tolerated Hypokalemia/hypomagnesemia -Replete  -Check magnesium--1.6  Microcytic anemia/iron deficiency anemia -Iron saturation 3%, ferritin 85 -Give IV dose Feraheme -Start oral iron  Thrombocytosis -Related to acute phase reaction as well as suspected iron deficiency anemia   Family Communication: Pt at beside Disposition Plan: Home when medically stable      Procedures/Studies: Dg Chest 2 View  08/13/2015  CLINICAL DATA:  54 year old female with productive cough and recent fevers EXAM: CHEST  2 VIEW COMPARISON:  Most recent prior chest x-ray 12/03/2012 ; abdominal radiograph including the lower lungs 07/31/2015 FINDINGS: Interval development of multifocal patchy ground-glass attenuation opacity with areas of increased confluence in the right upper, right middle and right lower lobes. More mild patchy airspace opacity is also present within the left lower lobe. There are small bilateral pleural effusions. A rounded lucency in the right lung apex raises concern for underlying cavitation or bulla formation. The parenchymal findings represent a dramatic change compared to the relatively recent abdominal radiographs dated 07/31/2015. Incompletely imaged stent again noted over the left upper abdomen. No acute osseous abnormality. IMPRESSION: 1. Interval development of multifocal airspace consolidation throughout the right lung, and to a lesser extent in the left lower lobe compared to 07/31/2015. Findings are concerning for multi lobar pneumonia, or possibly aspiration given the presence of the esophageal stent. 2. A rounded lucency overlying the right lung apex may represent a geographic region of relatively spared lung, or possibly developing cavitation or bulla formation. Recommend attention on follow-up chest radiographs. If the abnormality persists, CT scan of the chest may become warranted. 3. Small bilateral pleural  effusions. Electronically Signed   By: Jacqulynn Cadet M.D.   On: 08/13/2015 16:31   Ct Chest Wo Contrast  08/14/2015  CLINICAL DATA:  54 year old female with generalized fatigue and productive cough (clear sputum) presenting with shortness of breath. Abnormal chest  x-ray concerning for multilobar pneumonia. EXAM: CT CHEST WITHOUT CONTRAST TECHNIQUE: Multidetector CT imaging of the chest was performed following the standard protocol without IV contrast. COMPARISON:  No priors. FINDINGS: Mediastinum/Lymph Nodes: Heart size is normal. Small amount of pericardial fluid and/or thickening, unlikely to be of any hemodynamic significance at this time. No associated pericardial calcification. There is atherosclerosis of the thoracic aorta, the great vessels of the mediastinum and the coronary arteries, including calcified atherosclerotic plaque in the left main, left anterior descending, left circumflex and right coronary arteries. Soft tissue fullness in the right hilar region likely reflects underlying lymphadenopathy (assessment is limited on today's noncontrast CT examination). Multiple borderline enlarged right paratracheal lymph nodes are also noted measuring up to 9 mm in short axis. Esophagus is unremarkable in appearance. No axillary lymphadenopathy. Lungs/Pleura: 4.5 x 4.8 cm cavity in the apex of the right upper lobe in the midst of an area of extensive airspace consolidation. Widespread airspace consolidation is noted throughout the right lung, with patchy areas of ground-glass attenuation also noted throughout the left lung, presumably indicative of early endobronchial spread of infection into the contralateral lung. Large right and small left pleural effusions lying dependently. Upper Abdomen: Postoperative changes in the proximal stomach incompletely visualized, but likely related to prior gastric bypass procedure. There is a stent present in the proximal stomach. Musculoskeletal/Soft Tissues: There are  no aggressive appearing lytic or blastic lesions noted in the visualized portions of the skeleton. IMPRESSION: 1. Severe multilobar pneumonia in the lungs bilaterally (right greater than left), with large area of necrosis in the apex of the right upper lobe, and bilateral parapneumonic pleural effusions (right greater than left). 2. Large stent incompletely visualized in the stomach. This is unusual in appearance, and is favored to represent a distally migrated esophageal stent. Clinical correlation is recommended. 3. Prominent soft tissue in the right hilar region likely reflects underlying adenopathy, presumably reactive. There also multiple borderline enlarged right paratracheal lymph nodes. 4. Atherosclerosis, including left main and 3 vessel coronary artery disease. Please note that although the presence of coronary artery calcium documents the presence of coronary artery disease, the severity of this disease and any potential stenosis cannot be assessed on this non-gated CT examination. Assessment for potential risk factor modification, dietary therapy or pharmacologic therapy may be warranted, if clinically indicated. Electronically Signed   By: Vinnie Langton M.D.   On: 08/14/2015 07:24   Dg Chest Port 1 View  08/14/2015  CLINICAL DATA:  Status post right-sided thoracentesis EXAM: PORTABLE CHEST - 1 VIEW COMPARISON:  08/13/2015 FINDINGS: Cardiac shadow is stable. The lungs are well aerated bilaterally. Persistent infiltrate is noted throughout the mid right lung with cavitary area superiorly similar to that noted on prior examinations. No pneumothorax is noted following thoracentesis. Note is made of a previously placed esophageal stent which now lies within the stomach. IMPRESSION: Persistent infiltrative change and cavitary changes in the right lung. No pneumothorax is noted following thoracentesis. Electronically Signed   By: Inez Catalina M.D.   On: 08/14/2015 14:56   Dg Abd 2 Views  07/31/2015   CLINICAL DATA:  Gastric outlet stenosis . EXAM: ABDOMEN - 2 VIEW COMPARISON:  CT 08/27/2009 . FINDINGS: Gastric stent is noted coursing vertically over the region of the stomach. Clinical correlation suggested. Prior gastric surgery. Surgical clips right upper quadrant. No bowel distention. No gastric distention. No free air. IMPRESSION: Gastric stent is noted coursing vertically over the region of the stomach. Prior gastric surgery. No gastric  distention. Electronically Signed   By: Marcello Moores  Register   On: 07/31/2015 11:32   Dg Esophagus Dilatation  07/17/2015  CLINICAL DATA:  ESOPHAGEAL DILATATION Fluoroscopy was provided for use by the requesting physician.  No images were obtained for radiographic interpretation.         Subjective: Patient stated that she is breathing better but still having significant coughing and yellow sputum. Denies fevers, chills, chest pain, nausea, vomiting, diarrhea, abdominal pain, hematochezia, melena, hematuria, dysuria   Objective: Filed Vitals:   08/15/15 0413 08/15/15 0758 08/15/15 0925 08/15/15 1304  BP: 138/79 140/82  139/81  Pulse: 106 100  94  Temp: 98.4 F (36.9 C) 98 F (36.7 C)  98.8 F (37.1 C)  TempSrc: Oral Oral  Oral  Resp: 21 20  20   Height:      Weight: 49.351 kg (108 lb 12.8 oz)     SpO2: 96% 96% 84% 97%    Intake/Output Summary (Last 24 hours) at 08/15/15 1620 Last data filed at 08/15/15 1053  Gross per 24 hour  Intake 2838.33 ml  Output    750 ml  Net 2088.33 ml   Weight change: 7.847 kg (17 lb 4.8 oz) Exam:   General:  Pt is alert, follows commands appropriately, not in acute distress  HEENT: No icterus, No thrush, No neck mass, [place]/AT  Cardiovascular: RRR, S1/S2, no rubs, no gallops  Respiratory: Bilateral scattered rhonchi, right greater than left   Abdomen: Soft/+BS, non tender, non distended, no guarding; mild epigastric tenderness without any rebound   Extremities: No edema, No lymphangitis, No petechiae, No  rashes, no synovitis  Data Reviewed: Basic Metabolic Panel:  Recent Labs Lab 08/13/15 1550 08/13/15 2146 08/14/15 0614 08/15/15 0626  NA 141  --  138 139  K 2.6*  --  3.3* 3.1*  CL 103  --  104 104  CO2 29  --  25 28  GLUCOSE 65  --  59* 86  BUN 13  --  9 5*  CREATININE 0.52  --  0.49 0.49  CALCIUM 8.2*  --  7.8* 8.1*  MG  --  2.0  --  1.6*  PHOS  --  3.4  --   --    Liver Function Tests:  Recent Labs Lab 08/14/15 0614 08/14/15 1558 08/15/15 0626  AST 14*  --  11*  ALT 10*  --  8*  ALKPHOS 217*  --  176*  BILITOT 0.3  --  0.5  PROT 4.5* 4.4* 4.0*  ALBUMIN 1.3*  --  1.1*   No results for input(s): LIPASE, AMYLASE in the last 168 hours. No results for input(s): AMMONIA in the last 168 hours. CBC:  Recent Labs Lab 08/13/15 1550 08/14/15 0614 08/15/15 0626  WBC 16.4* 11.7* 9.7  NEUTROABS 15.2* 10.5* 9.0*  HGB 8.2* 7.4* 6.9*  HCT 27.2* 24.7* 22.0*  MCV 80.2 80.2 79.7  PLT 596* 522* 467*   Cardiac Enzymes: No results for input(s): CKTOTAL, CKMB, CKMBINDEX, TROPONINI in the last 168 hours. BNP: Invalid input(s): POCBNP CBG:  Recent Labs Lab 08/14/15 1106  GLUCAP 73    Recent Results (from the past 240 hour(s))  Culture, blood (routine x 2) Call MD if unable to obtain prior to antibiotics being given     Status: None (Preliminary result)   Collection Time: 08/13/15  9:40 PM  Result Value Ref Range Status   Specimen Description BLOOD RIGHT ARM  Final   Special Requests BOTTLES DRAWN AEROBIC AND ANAEROBIC 10ML  Final   Culture NO GROWTH 1 DAY  Final   Report Status PENDING  Incomplete  Culture, blood (routine x 2) Call MD if unable to obtain prior to antibiotics being given     Status: None (Preliminary result)   Collection Time: 08/13/15  9:46 PM  Result Value Ref Range Status   Specimen Description BLOOD RIGHT ARM  Final   Special Requests BOTTLES DRAWN AEROBIC AND ANAEROBIC 10ML  Final   Culture NO GROWTH 1 DAY  Final   Report Status PENDING   Incomplete  Culture, respiratory (NON-Expectorated)     Status: None (Preliminary result)   Collection Time: 08/13/15 10:12 PM  Result Value Ref Range Status   Specimen Description EXPECTORATED SPUTUM  Final   Special Requests NONE  Final   Gram Stain   Final    FEW WBC PRESENT, PREDOMINANTLY PMN NO SQUAMOUS EPITHELIAL CELLS SEEN ABUNDANT GRAM POSITIVE COCCI IN PAIRS IN CLUSTERS FEW GRAM POSITIVE RODS Performed at [facility]    Culture   Final    ABUNDANT STAPHYLOCOCCUS AUREUS Note: RIFAMPIN AND GENTAMICIN SHOULD NOT BE USED AS SINGLE DRUGS FOR TREATMENT OF STAPH INFECTIONS. Performed at [facility]    Report Status PENDING  Incomplete  MRSA PCR Screening     Status: Abnormal   Collection Time: 08/13/15 10:16 PM  Result Value Ref Range Status   MRSA by PCR POSITIVE (A) NEGATIVE Final    Comment:        The GeneXpert MRSA Assay (FDA approved for NASAL specimens only), is one component of a comprehensive MRSA colonization surveillance program. It is not intended to diagnose MRSA infection nor to guide or monitor treatment for MRSA infections. RESULT CALLED TO, READ BACK BY AND VERIFIED WITH: T PHILLIPS @0108  08/14/15 MKELLY   AFB culture with smear     Status: None (Preliminary result)   Collection Time: 08/14/15 12:25 PM  Result Value Ref Range Status   Specimen Description SPUTUM  Final   Special Requests Immunocompromised  Final   Acid Fast Smear   Final    NO ACID FAST BACILLI SEEN Performed at [facility]    Culture   Final    CULTURE WILL BE EXAMINED FOR 6 WEEKS BEFORE ISSUING A FINAL REPORT Performed at [facility]    Report Status PENDING  Incomplete  Body fluid culture     Status: None (Preliminary result)   Collection Time: 08/14/15  1:45 PM  Result Value Ref Range Status   Specimen Description FLUID PLEURAL  Final   Special Requests Normal  Final   Gram Stain   Final    ABUNDANT WBC PRESENT,BOTH PMN AND  MONONUCLEAR NO ORGANISMS SEEN    Culture NO GROWTH < 24 HOURS  Final   Report Status PENDING  Incomplete  Fungus Culture with Smear     Status: None (Preliminary result)   Collection Time: 08/14/15  1:45 PM  Result Value Ref Range Status   Specimen Description FLUID PLEURAL  Final   Special Requests NONE  Final   Fungal Smear   Final    NO YEAST OR FUNGAL ELEMENTS SEEN Performed at [facility]    Culture   Final    CULTURE IN PROGRESS FOR FOUR WEEKS Performed at [facility]    Report Status PENDING  Incomplete     Scheduled Meds: . buPROPion  300 mg Oral Daily  . cefTAZidime (FORTAZ)  IV  2 g Intravenous Q8H  . [  START ON 08/16/2015] Chlorhexidine Gluconate Cloth  6 each Topical Q0600  . cholecalciferol  1,000 Units Oral Daily  . clidinium-chlordiazePOXIDE  1 capsule Oral TID AC & HS  . ipratropium-albuterol  3 mL Nebulization Q6H  . ketorolac  15 mg Intravenous Once  . mupirocin ointment  1 application Nasal BID  . pantoprazole (PROTONIX) IV  40 mg Intravenous Q24H  . potassium chloride  20 mEq Oral Once  . sodium chloride  3 mL Intravenous Q12H  . vancomycin  1,000 mg Intravenous Q24H  . vitamin B-12  1,000 mcg Oral Daily  . vitamin C  500 mg Oral Daily   Continuous Infusions:    Miley Blanchett, DO  [facility] Pager [phone]  If 7PM-7AM, please contact night-coverage [email or web address] Password [email or web address] 08/15/2015, 4:20 PM   LOS: 2 days

## 2015-08-15 NOTE — Progress Notes (Signed)
Offered patient to sit up in chair. Patient refused.  Will offer later.    Ave Filter, RN

## 2015-08-15 NOTE — Progress Notes (Signed)
Hgb = 6.9 this AM. Day shift RN notified. Will page MD and continue to monitor.

## 2015-08-15 NOTE — Progress Notes (Addendum)
Dr. Wynelle Cleveland called back r/t AM H/H, no orde at this time. Will continue to monitor. Dr Tat will resume care. Will notify MD once available. No visible bleeding noted anywhere. Patient denied any distress at this time.  Ave Filter, RN

## 2015-08-16 ENCOUNTER — Inpatient Hospital Stay (HOSPITAL_COMMUNITY): Payer: Medicare Other

## 2015-08-16 DIAGNOSIS — J15212 Pneumonia due to Methicillin resistant Staphylococcus aureus: Secondary | ICD-10-CM

## 2015-08-16 LAB — CBC WITH DIFFERENTIAL/PLATELET
Basophils Absolute: 0 10*3/uL (ref 0.0–0.1)
Basophils Relative: 0 %
EOS PCT: 1 %
Eosinophils Absolute: 0.1 10*3/uL (ref 0.0–0.7)
HCT: 23.2 % — ABNORMAL LOW (ref 36.0–46.0)
Hemoglobin: 7.1 g/dL — ABNORMAL LOW (ref 12.0–15.0)
LYMPHS ABS: 0.3 10*3/uL — AB (ref 0.7–4.0)
LYMPHS PCT: 4 %
MCH: 25.1 pg — AB (ref 26.0–34.0)
MCHC: 30.6 g/dL (ref 30.0–36.0)
MCV: 82 fL (ref 78.0–100.0)
MONO ABS: 0.4 10*3/uL (ref 0.1–1.0)
Monocytes Relative: 4 %
Neutro Abs: 8 10*3/uL — ABNORMAL HIGH (ref 1.7–7.7)
Neutrophils Relative %: 91 %
PLATELETS: 463 10*3/uL — AB (ref 150–400)
RBC: 2.83 MIL/uL — AB (ref 3.87–5.11)
RDW: 16.4 % — ABNORMAL HIGH (ref 11.5–15.5)
WBC: 8.8 10*3/uL (ref 4.0–10.5)

## 2015-08-16 LAB — COMPREHENSIVE METABOLIC PANEL
ALK PHOS: 172 U/L — AB (ref 38–126)
ALT: 9 U/L — AB (ref 14–54)
AST: 15 U/L (ref 15–41)
Albumin: 1.1 g/dL — ABNORMAL LOW (ref 3.5–5.0)
Anion gap: 9 (ref 5–15)
CHLORIDE: 98 mmol/L — AB (ref 101–111)
CO2: 31 mmol/L (ref 22–32)
Calcium: 8.3 mg/dL — ABNORMAL LOW (ref 8.9–10.3)
Creatinine, Ser: 0.48 mg/dL (ref 0.44–1.00)
GFR calc non Af Amer: >60 mL/min (ref >60)
GLUCOSE: 76 mg/dL (ref 65–99)
POTASSIUM: 3 mmol/L — AB (ref 3.5–5.1)
SODIUM: 138 mmol/L (ref 135–145)
Total Bilirubin: 0.7 mg/dL (ref 0.3–1.2)
Total Protein: 4.5 g/dL — ABNORMAL LOW (ref 6.5–8.1)

## 2015-08-16 LAB — CHOLESTEROL, BODY FLUID: CHOL FL: 21 mg/dL

## 2015-08-16 LAB — CULTURE, RESPIRATORY W GRAM STAIN

## 2015-08-16 LAB — CULTURE, RESPIRATORY

## 2015-08-16 LAB — MAGNESIUM: Magnesium: 2.1 mg/dL (ref 1.7–2.4)

## 2015-08-16 MED ORDER — ALBUTEROL SULFATE (2.5 MG/3ML) 0.083% IN NEBU: 2.5000 mg | INHALATION_SOLUTION | RESPIRATORY_TRACT | Status: DC | PRN

## 2015-08-16 MED ORDER — ENSURE ENLIVE PO LIQD: 237.0000 mL | Freq: Three times a day (TID) | ORAL | Status: DC | PRN

## 2015-08-16 MED ORDER — ENSURE ENLIVE PO LIQD: 237.0000 mL | ORAL | Status: DC

## 2015-08-16 MED ORDER — CILIDINIUM-CHLORDIAZEPOXIDE 2.5-5 MG PO CAPS
1.0000 | ORAL_CAPSULE | Freq: Three times a day (TID) | ORAL | Status: DC | PRN
  Filled 2015-08-16: qty 1

## 2015-08-16 MED ORDER — ADULT MULTIVITAMIN W/MINERALS CH
1.0000 | ORAL_TABLET | Freq: Every day | ORAL | Status: DC
  Administered 2015-08-17 – 2015-08-19 (×2): 1 via ORAL
  Filled 2015-08-16 (×4): qty 1

## 2015-08-16 MED ORDER — UNJURY CHICKEN SOUP POWDER
2.0000 [oz_av] | Freq: Two times a day (BID) | ORAL | Status: DC
  Administered 2015-08-17: 2 [oz_av] via ORAL
  Filled 2015-08-16 (×4): qty 27

## 2015-08-16 MED ORDER — POTASSIUM CHLORIDE CRYS ER 20 MEQ PO TBCR
40.0000 meq | EXTENDED_RELEASE_TABLET | Freq: Once | ORAL | Status: AC
  Administered 2015-08-16: 40 meq via ORAL
  Filled 2015-08-16: qty 2

## 2015-08-16 MED ORDER — MAGIC MOUTHWASH
10.0000 mL | Freq: Three times a day (TID) | ORAL | Status: DC | PRN
  Filled 2015-08-16: qty 10

## 2015-08-16 MED ORDER — GUAIFENESIN ER 600 MG PO TB12
1200.0000 mg | ORAL_TABLET | Freq: Two times a day (BID) | ORAL | Status: DC
  Filled 2015-08-16: qty 2

## 2015-08-16 MED ORDER — GUAIFENESIN ER 600 MG PO TB12: 1200.0000 mg | ORAL_TABLET | Freq: Two times a day (BID) | ORAL | Status: DC | PRN

## 2015-08-16 MED ORDER — PANTOPRAZOLE SODIUM 40 MG PO TBEC
40.0000 mg | DELAYED_RELEASE_TABLET | Freq: Every day | ORAL | Status: DC
  Administered 2015-08-17 – 2015-08-20 (×4): 40 mg via ORAL
  Filled 2015-08-16 (×5): qty 1

## 2015-08-16 NOTE — Progress Notes (Signed)
Pt continues to refuse Librax.

## 2015-08-16 NOTE — Progress Notes (Signed)
PULMONARY / CRITICAL CARE MEDICINE   Name: Autumn Turner MRN: [id number] DOB: 10-10-1960    ADMISSION DATE:  08/13/2015 CONSULTATION DATE:  08/14/2015  REFERRING MD:  [facility]  CHIEF COMPLAINT:  Short of breath  SUBJECTIVE:  Cough better, but hard to bring up sputum.  Still has Rt sided chest pain, but better.  VITAL SIGNS: BP 127/68 mmHg  Pulse 90  Temp(Src) 99.3 F (37.4 C) (Oral)  Resp 20  Ht 5\' 4"  (1.626 m)  Wt 105 lb (47.628 kg)  BMI 18.01 kg/m2  SpO2 96%  INTAKE / OUTPUT: I/O last 3 completed shifts: In: 3545.3 [P.O.:840; I.V.:1588.3; IV [id number] Out: 1750 [Urine:1750]  PHYSICAL EXAMINATION: General: thin Neuro:  Alert, normal strength HEENT:  No sinus tenderness Cardiovascular:  regular Lungs:  B/l crackles Rt > Lt  Abdomen:  Soft, non tender Musculoskeletal:  Decreased muscle bulk Skin:  No rashes  LABS:  BMET  Recent Labs Lab 08/14/15 0614 08/15/15 0626 08/16/15 0640  NA 138 139 138  K 3.3* 3.1* 3.0*  CL 104 104 98*  CO2 25 28 31   BUN 9 5* <5*  CREATININE 0.49 0.49 0.48  GLUCOSE 59* 86 76    Electrolytes  Recent Labs Lab 08/13/15 2146 08/14/15 0614 08/15/15 0626 08/16/15 0640  CALCIUM  --  7.8* 8.1* 8.3*  MG 2.0  --  1.6* 2.1  PHOS 3.4  --   --   --     CBC  Recent Labs Lab 08/14/15 0614 08/15/15 0626 08/16/15 0640  WBC 11.7* 9.7 8.8  HGB 7.4* 6.9* 7.1*  HCT 24.7* 22.0* 23.2*  PLT 522* 467* 463*    Sepsis Markers  Recent Labs Lab 08/13/15 1606  LATICACIDVEN 1.22    Liver Enzymes  Recent Labs Lab 08/14/15 0614 08/15/15 0626 08/16/15 0640  AST 14* 11* 15  ALT 10* 8* 9*  ALKPHOS 217* 176* 172*  BILITOT 0.3 0.5 0.7  ALBUMIN 1.3* 1.1* 1.1*    Glucose  Recent Labs Lab 08/14/15 1106  GLUCAP 73    Imaging Dg Chest 2 View  08/16/2015  CLINICAL DATA:  Cough, shortness of breath, pneumonia. EXAM: CHEST  2 VIEW COMPARISON:  Portable chest x-ray of August 14, 2015 FINDINGS: On the right the  confluent interstitial and alveolar opacities in the mid and lower lung are stable. There is a stable cavity in the right upper lobe. There are slightly increased interstitial densities in the left mid and lower lung. There is a small left pleural effusion. The heart is not enlarged. There is stable right hilar prominence. A stent like device is visible in the left upper quadrant of the abdomen and its positioning has been described on the previous CT scan. IMPRESSION: 1. Stable airspace opacity on the right consistent with pneumonia. 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 Slight interval increase in interstitial densities in the left mid and lower lung may reflect progressive pneumonia here. 2. There is no pulmonary vascular congestion. There is a small left pleural effusion. Electronically Signed   By: David  Martinique M.D.   On: 08/16/2015 09:07     STUDIES:  12/13 CT chest >> multilobar PNA with areas of necrosis 12/13 Rt thoracentesis >> 580 ml fluid, protein < 3/4.4, LDH 155/226, WBC 5894 (99%N)  CULTURES: 12/12 Blood >>  12/12 Sputum >> MRSA 12/13 Rt pleural fluid >>   ANTIBIOTICS: 12/12 Vancomycin >> 12/12 Fortaz >> 12/14  SIGNIFICANT EVENTS: 12/12 Admit  LINES/TUBES:  DISCUSSION:  54 yo female smoker with productive cough, fatigue, dyspnea, fever 103F from necrotizing MRSA multilobar PNA with sepsis and pleural effusion.  PCCM asked to assess pleural effusion.  ASSESSMENT / PLAN:  Acute hypoxic respiratory failure 2nd to Staph aureus multilobar, necrotizing pneumonia. Parapneumonia exudate Rt pleural effusion with neutrophil predominance >> no recurrence on CXR 12/15. Plan: - day 4 of vancomycin per primary team - f/u CXR intermittently - will adjust bronchial hygiene regimen  PCCM will sign off.  Please call if additional help is needed while pt is in hospital.  Chesley Mires, MD [place] 08/16/2015, 10:34 AM Pager:   [phone] After 3pm call: [phone]

## 2015-08-16 NOTE — Progress Notes (Addendum)
PROGRESS NOTE  Autumn Turner [id number] DOB: 1960/09/03 DOA: 08/13/2015 PCP: Robyn Haber, MD  Brief History 54 year old female with a history of gastric bypass, peptic ulcer disease, hypertension, anxiety presented with one-week history of productive cough, dyspnea, fatigue, and subjective fever. The patient has had intermittent nausea and vomiting with her last episode on 08/10/2015. Patient denies any headache, neck pain, hemoptysis, chest pain, dysuria, hematuria, hematochezia, melena. Patient denies any recent sick contacts. Upon presentation, chest x-ray revealed multifocal consolidations right greater than left lung. She was noted to have WBC 16.4 and lactic acid 1.22. The patient was started on intravenous antibiotics pending culture data.   Subjective: Continues to cough and have right-sided chest pain. No significant dyspnea at rest.  Assessment/Plan: Sepsis- right lung Necrotizing pneumonia with parapneumonic effusion - MRSA - sputum culture>> MRSA- will d/c Fortaz- cont Vancomycin -08/13/2015 CT chest multilobar infiltrates, right greater than left with area of necrosis in the right upper lobe apex -Appreciate pulmonary-->thoracocentesis on 08/14/15-->5894 WBC (99% PMN)- no organisms seen -MRSA nasal swab is positive - AFB sputum x 1 is negative- DC airborne precautions- QuantiFERON Gold P   Acute respiratory failure with hypoxia -Secondary to pneumonia in the setting of underlying COPD -  2 L nasal cannula  Chronic abdominal pain -h/o gastric bypass with strictures at her anastomotic site status post balloon angioplasty and stent  07/17/2015 -Pain improving -Advance diet to solid food  Hypokalemia/hypomagnesemia -Repleting   Microcytic anemia/iron deficiency anemia -Iron saturation 3%, ferritin 85 -Given IV dose Feraheme 12/14 -We'll need to start oral iron -currently refusing oral medications  Thrombocytosis -Related to acute phase reaction as  well as suspected iron deficiency anemia   Family Communication: Pt at beside Disposition Plan: Home when medically stable      Procedures/Studies: Dg Chest 2 View  08/13/2015  CLINICAL DATA:  54 year old female with productive cough and recent fevers EXAM: CHEST  2 VIEW COMPARISON:  Most recent prior chest x-ray 12/03/2012 ; abdominal radiograph including the lower lungs 07/31/2015 FINDINGS: Interval development of multifocal patchy ground-glass attenuation opacity with areas of increased confluence in the right upper, right middle and right lower lobes. More mild patchy airspace opacity is also present within the left lower lobe. There are small bilateral pleural effusions. A rounded lucency in the right lung apex raises concern for underlying cavitation or bulla formation. The parenchymal findings represent a dramatic change compared to the relatively recent abdominal radiographs dated 07/31/2015. Incompletely imaged stent again noted over the left upper abdomen. No acute osseous abnormality. IMPRESSION: 1. Interval development of multifocal airspace consolidation throughout the right lung, and to a lesser extent in the left lower lobe compared to 07/31/2015. Findings are concerning for multi lobar pneumonia, or possibly aspiration given the presence of the esophageal stent. 2. A rounded lucency overlying the right lung apex may represent a geographic region of relatively spared lung, or possibly developing cavitation or bulla formation. Recommend attention on follow-up chest radiographs. If the abnormality persists, CT scan of the chest may become warranted. 3. Small bilateral pleural effusions. Electronically Signed   By: Jacqulynn Cadet M.D.   On: 08/13/2015 16:31   Ct Chest Wo Contrast  08/14/2015  CLINICAL DATA:  54 year old female with generalized fatigue and productive cough (clear sputum) presenting with shortness of breath. Abnormal chest x-ray concerning for multilobar pneumonia.  EXAM: CT CHEST WITHOUT CONTRAST TECHNIQUE: Multidetector CT imaging of the chest was performed following the standard protocol without  IV contrast. COMPARISON:  No priors. FINDINGS: Mediastinum/Lymph Nodes: Heart size is normal. Small amount of pericardial fluid and/or thickening, unlikely to be of any hemodynamic significance at this time. No associated pericardial calcification. There is atherosclerosis of the thoracic aorta, the great vessels of the mediastinum and the coronary arteries, including calcified atherosclerotic plaque in the left main, left anterior descending, left circumflex and right coronary arteries. Soft tissue fullness in the right hilar region likely reflects underlying lymphadenopathy (assessment is limited on today's noncontrast CT examination). Multiple borderline enlarged right paratracheal lymph nodes are also noted measuring up to 9 mm in short axis. Esophagus is unremarkable in appearance. No axillary lymphadenopathy. Lungs/Pleura: 4.5 x 4.8 cm cavity in the apex of the right upper lobe in the midst of an area of extensive airspace consolidation. Widespread airspace consolidation is noted throughout the right lung, with patchy areas of ground-glass attenuation also noted throughout the left lung, presumably indicative of early endobronchial spread of infection into the contralateral lung. Large right and small left pleural effusions lying dependently. Upper Abdomen: Postoperative changes in the proximal stomach incompletely visualized, but likely related to prior gastric bypass procedure. There is a stent present in the proximal stomach. Musculoskeletal/Soft Tissues: There are no aggressive appearing lytic or blastic lesions noted in the visualized portions of the skeleton. IMPRESSION: 1. Severe multilobar pneumonia in the lungs bilaterally (right greater than left), with large area of necrosis in the apex of the right upper lobe, and bilateral parapneumonic pleural effusions (right  greater than left). 2. Large stent incompletely visualized in the stomach. This is unusual in appearance, and is favored to represent a distally migrated esophageal stent. Clinical correlation is recommended. 3. Prominent soft tissue in the right hilar region likely reflects underlying adenopathy, presumably reactive. There also multiple borderline enlarged right paratracheal lymph nodes. 4. Atherosclerosis, including left main and 3 vessel coronary artery disease. Please note that although the presence of coronary artery calcium documents the presence of coronary artery disease, the severity of this disease and any potential stenosis cannot be assessed on this non-gated CT examination. Assessment for potential risk factor modification, dietary therapy or pharmacologic therapy may be warranted, if clinically indicated. Electronically Signed   By: Vinnie Langton M.D.   On: 08/14/2015 07:24   Dg Chest Port 1 View  08/14/2015  CLINICAL DATA:  Status post right-sided thoracentesis EXAM: PORTABLE CHEST - 1 VIEW COMPARISON:  08/13/2015 FINDINGS: Cardiac shadow is stable. The lungs are well aerated bilaterally. Persistent infiltrate is noted throughout the mid right lung with cavitary area superiorly similar to that noted on prior examinations. No pneumothorax is noted following thoracentesis. Note is made of a previously placed esophageal stent which now lies within the stomach. IMPRESSION: Persistent infiltrative change and cavitary changes in the right lung. No pneumothorax is noted following thoracentesis. Electronically Signed   By: Inez Catalina M.D.   On: 08/14/2015 14:56   Dg Abd 2 Views  07/31/2015  CLINICAL DATA:  Gastric outlet stenosis . EXAM: ABDOMEN - 2 VIEW COMPARISON:  CT 08/27/2009 . FINDINGS: Gastric stent is noted coursing vertically over the region of the stomach. Clinical correlation suggested. Prior gastric surgery. Surgical clips right upper quadrant. No bowel distention. No gastric  distention. No free air. IMPRESSION: Gastric stent is noted coursing vertically over the region of the stomach. Prior gastric surgery. No gastric distention. Electronically Signed   By: [place]   On: 07/31/2015 11:32   Dg Esophagus Dilatation  07/17/2015  CLINICAL DATA:  ESOPHAGEAL DILATATION Fluoroscopy was provided for use by the requesting physician.  No images were obtained for radiographic interpretation.    Objective: Filed Vitals:   08/15/15 2007 08/15/15 2119 08/15/15 2346 08/16/15 0539  BP:   108/69 127/68  Pulse:   98 90  Temp: 99.3 F (37.4 C)  99 F (37.2 C) 99.3 F (37.4 C)  TempSrc: Oral  Axillary Oral  Resp:   20 20  Height:      Weight:    47.628 kg (105 lb)  SpO2:  100%  96%    Intake/Output Summary (Last 24 hours) at 08/16/15 0833 Last data filed at 08/16/15 0540  Gross per 24 hour  Intake    827 ml  Output   1400 ml  Net   -573 ml   Weight change: -1.497 kg (-3 lb 4.8 oz) Exam:   General:  Pt is alert, follows commands appropriately, not in acute distress  HEENT: No icterus, No thrush, No neck mass, [place]/AT  Cardiovascular: RRR, S1/S2, no rubs, no gallops  Respiratory: decreased breath sounds in RM lung field- no crackles or wheezing  Abdomen: Soft/+BS, non tender, non distended, no guarding; mild epigastric tenderness without any rebound   Extremities: No edema, No lymphangitis, No petechiae, No rashes, no synovitis  Data Reviewed: Basic Metabolic Panel:  Recent Labs Lab 08/13/15 1550 08/13/15 2146 08/14/15 0614 08/15/15 0626 08/16/15 0640  NA 141  --  138 139 138  K 2.6*  --  3.3* 3.1* 3.0*  CL 103  --  104 104 98*  CO2 29  --  25 28 31   GLUCOSE 65  --  59* 86 76  BUN 13  --  9 5* <5*  CREATININE 0.52  --  0.49 0.49 0.48  CALCIUM 8.2*  --  7.8* 8.1* 8.3*  MG  --  2.0  --  1.6* 2.1  PHOS  --  3.4  --   --   --    Liver Function Tests:  Recent Labs Lab 08/14/15 0614 08/14/15 1558 08/15/15 0626 08/16/15 0640  AST 14*   --  11* 15  ALT 10*  --  8* 9*  ALKPHOS 217*  --  176* 172*  BILITOT 0.3  --  0.5 0.7  PROT 4.5* 4.4* 4.0* 4.5*  ALBUMIN 1.3*  --  1.1* 1.1*   No results for input(s): LIPASE, AMYLASE in the last 168 hours. No results for input(s): AMMONIA in the last 168 hours. CBC:  Recent Labs Lab 08/13/15 1550 08/14/15 0614 08/15/15 0626 08/16/15 0640  WBC 16.4* 11.7* 9.7 8.8  NEUTROABS 15.2* 10.5* 9.0* 8.0*  HGB 8.2* 7.4* 6.9* 7.1*  HCT 27.2* 24.7* 22.0* 23.2*  MCV 80.2 80.2 79.7 82.0  PLT 596* 522* 467* 463*   Cardiac Enzymes: No results for input(s): CKTOTAL, CKMB, CKMBINDEX, TROPONINI in the last 168 hours. BNP: Invalid input(s): POCBNP CBG:  Recent Labs Lab 08/14/15 1106  GLUCAP 73    Recent Results (from the past 240 hour(s))  Culture, blood (routine x 2) Call MD if unable to obtain prior to antibiotics being given     Status: None (Preliminary result)   Collection Time: 08/13/15  9:40 PM  Result Value Ref Range Status   Specimen Description BLOOD RIGHT ARM  Final   Special Requests BOTTLES DRAWN AEROBIC AND ANAEROBIC 10ML  Final   Culture NO GROWTH 1 DAY  Final   Report Status PENDING  Incomplete  Culture, blood (routine x 2) Call MD if  unable to obtain prior to antibiotics being given     Status: None (Preliminary result)   Collection Time: 08/13/15  9:46 PM  Result Value Ref Range Status   Specimen Description BLOOD RIGHT ARM  Final   Special Requests BOTTLES DRAWN AEROBIC AND ANAEROBIC 10ML  Final   Culture NO GROWTH 1 DAY  Final   Report Status PENDING  Incomplete  Culture, respiratory (NON-Expectorated)     Status: None   Collection Time: 08/13/15 10:12 PM  Result Value Ref Range Status   Specimen Description EXPECTORATED SPUTUM  Final   Special Requests NONE  Final   Gram Stain   Final    FEW WBC PRESENT, PREDOMINANTLY PMN NO SQUAMOUS EPITHELIAL CELLS SEEN ABUNDANT GRAM POSITIVE COCCI IN PAIRS IN CLUSTERS FEW GRAM POSITIVE RODS Performed at [facility]    Culture   Final    ABUNDANT METHICILLIN RESISTANT STAPHYLOCOCCUS AUREUS Note: RIFAMPIN AND GENTAMICIN SHOULD NOT BE USED AS SINGLE DRUGS FOR TREATMENT OF STAPH INFECTIONS. This organism DOES NOT demonstrate inducible Clindamycin resistance in vitro. CRITICAL RESULT CALLED TO, READ BACK BY AND VERIFIED WITH: HEATHER S 12/15  @0810  BY REAMM Performed at [facility]    Report Status 08/16/2015 FINAL  Final   Organism ID, Bacteria METHICILLIN RESISTANT STAPHYLOCOCCUS AUREUS  Final      Susceptibility   Methicillin resistant staphylococcus aureus - MIC*    CLINDAMYCIN <=0.25 SENSITIVE Sensitive     ERYTHROMYCIN >=8 RESISTANT Resistant     GENTAMICIN <=0.5 SENSITIVE Sensitive     LEVOFLOXACIN <=0.12 SENSITIVE Sensitive     OXACILLIN >=4 RESISTANT Resistant     RIFAMPIN <=0.5 SENSITIVE Sensitive     TRIMETH/SULFA <=10 SENSITIVE Sensitive     VANCOMYCIN 1 SENSITIVE Sensitive     TETRACYCLINE <=1 SENSITIVE Sensitive     * ABUNDANT METHICILLIN RESISTANT STAPHYLOCOCCUS AUREUS  MRSA PCR Screening     Status: Abnormal   Collection Time: 08/13/15 10:16 PM  Result Value Ref Range Status   MRSA by PCR POSITIVE (A) NEGATIVE Final    Comment:        The GeneXpert MRSA Assay (FDA approved for NASAL specimens only), is one component of a comprehensive MRSA colonization surveillance program. It is not intended to diagnose MRSA infection nor to guide or monitor treatment for MRSA infections. RESULT CALLED TO, READ BACK BY AND VERIFIED WITH: T PHILLIPS @0108  08/14/15 MKELLY   AFB culture with smear     Status: None (Preliminary result)   Collection Time: 08/14/15 12:25 PM  Result Value Ref Range Status   Specimen Description SPUTUM  Final   Special Requests Immunocompromised  Final   Acid Fast Smear   Final    NO ACID FAST BACILLI SEEN Performed at [facility]    Culture   Final    CULTURE WILL BE EXAMINED FOR 6 WEEKS BEFORE ISSUING A FINAL REPORT Performed at  [facility]    Report Status PENDING  Incomplete  Body fluid culture     Status: None (Preliminary result)   Collection Time: 08/14/15  1:45 PM  Result Value Ref Range Status   Specimen Description FLUID PLEURAL  Final   Special Requests Normal  Final   Gram Stain   Final    ABUNDANT WBC PRESENT,BOTH PMN AND MONONUCLEAR NO ORGANISMS SEEN    Culture NO GROWTH < 24 HOURS  Final   Report Status PENDING  Incomplete  Fungus Culture with Smear  Status: None (Preliminary result)   Collection Time: 08/14/15  1:45 PM  Result Value Ref Range Status   Specimen Description FLUID PLEURAL  Final   Special Requests NONE  Final   Fungal Smear   Final    NO YEAST OR FUNGAL ELEMENTS SEEN Performed at [facility]    Culture   Final    CULTURE IN PROGRESS FOR FOUR WEEKS Performed at [facility]    Report Status PENDING  Incomplete     Scheduled Meds: . buPROPion  300 mg Oral Daily  . cefTAZidime (FORTAZ)  IV  2 g Intravenous Q8H  . Chlorhexidine Gluconate Cloth  6 each Topical Q0600  . cholecalciferol  1,000 Units Oral Daily  . clidinium-chlordiazePOXIDE  1 capsule Oral TID AC & HS  . ipratropium-albuterol  3 mL Nebulization Q6H  . ketorolac  15 mg Intravenous Once  . mupirocin ointment  1 application Nasal BID  . pantoprazole (PROTONIX) IV  40 mg Intravenous Q24H  . sodium chloride  3 mL Intravenous Q12H  . vancomycin  1,000 mg Intravenous Q24H  . vitamin B-12  1,000 mcg Oral Daily  . vitamin C  500 mg Oral Daily   Continuous Infusions:    Debbe Odea, MD [facility] Pager : [email or web address] Password [email or web address] 08/16/2015, 8:33 AM   LOS: 3 days

## 2015-08-16 NOTE — Progress Notes (Signed)
CRITICAL VALUE ALERT  Critical value received:  Resp culture 12/12; growing MRSA  Date of notification:  08/16/2015  Time of notification:  0808  Critical value read back:Yes.    Nurse who received alert:  Leonidas Romberg, RN  MD notified (1st page):  Dr. Wynelle Cleveland  Time of first page:  0810  MD notified (2nd page):  Time of second page:  Responding MD:  Dr. Wynelle Cleveland  Time MD responded:  [phone]

## 2015-08-16 NOTE — Progress Notes (Signed)
Initial Nutrition Assessment  DOCUMENTATION CODES:   Severe malnutrition in context of chronic illness, Underweight  INTERVENTION:  Provide Ensure Enlive po once daily and PRN, each supplement provides 350 kcal and 20 grams of protein Provide Unjury Shake BID, provides 20 grams of protein and 100 kcal Provide Multivitamin with minerals daily   NUTRITION DIAGNOSIS:   Malnutrition related to chronic illness, vomiting as evidenced by energy intake < or equal to 75% for > or equal to 1 month, severe depletion of muscle mass, severe depletion of body fat.   GOAL:   Patient will meet greater than or equal to 90% of their needs   MONITOR:   PO intake, Supplement acceptance, Labs, Skin  REASON FOR ASSESSMENT:   Malnutrition Screening Tool    ASSESSMENT:   54 year old female with a history of gastric bypass, peptic ulcer disease, hypertension, anxiety presented with one-week history of productive cough, dyspnea, fatigue, and subjective fever. The patient has had intermittent nausea and vomiting with her last episode on 08/10/2015.   Pt reports having gastric bypass surgery in 2001 at which time she weighed 385 lbs. In 2004 she was maintaining 225 lbs, had plastic surgery to remove 14 lbs of skin, and has had issues with vomiting very since causing gradual ongoing weight loss. She reports weighing 98 lbs a few months ago, but losing down to 88 lbs after her father died. She reports having a stent placed one month ago, was eating well for a couple weeks until vomiting started again.  Pt is severely malnourished with severe muscle and fat wasting per nutrition-focused physical exam. Per nursing notes, pt ate 80% of breakfast (full liquids); lunch tray was primarily untouched at time of visit. Pt tried strawberry Ensure and is agreeable to drinking 1-2 times per day. Pt reports being took weak to move recently and having sore spots on bottom and hips. RD emphasized the importance of adequate  nutrition, protein, vitamins and minerals.   Labs: low potassium, low chloride, low calcium, low albumin, low hemoglobin  Diet Order:  DIET SOFT Room service appropriate?: Yes; Fluid consistency:: Thin  Skin:  Reviewed, no issues  Last BM:  12/12  Height:   Ht Readings from Last 1 Encounters:  08/14/15 5\' 4"  (1.626 m)    Weight:   Wt Readings from Last 1 Encounters:  08/16/15 105 lb (47.628 kg)    Ideal Body Weight:  54.5 kg  BMI:  Body mass index is 18.01 kg/(m^2).  Estimated Nutritional Needs:   Kcal:  [id number]  Protein:  55-65 grams  Fluid:  1.4-1.6 L/day  EDUCATION NEEDS:   No education needs identified at this time  [place], LDN Inpatient Clinical Dietitian Pager: [phone] After Hours Pager: [phone]

## 2015-08-16 NOTE — Progress Notes (Signed)
Pt refused most of her medications today. MD aware and has been refusing for previous shifts. No issues at this time. Will continue to monitor pt closely. Leanne Chang, RN

## 2015-08-16 NOTE — Progress Notes (Signed)
Pharmacy Antibiotic Follow-up Note  METRA MOST is a 54 y.o. year-old female admitted on 08/13/2015.  The patient is currently on day 4 of Vancomycin for MRSA pneumonia with necrosis in the apex of RUL on CT. WBC wnl, Afebrile.  Assessment/Plan: - Continue Vancomycin 1g IV q24h - F/u vancomycin trough  - Monitor CBC, vitals and length of treatment  Temp (24hrs), Avg:99.1 F (37.3 C), Min:98.8 F (37.1 C), Max:99.3 F (37.4 C)   Recent Labs Lab 08/13/15 1550 08/14/15 0614 08/15/15 0626 08/16/15 0640  WBC 16.4* 11.7* 9.7 8.8    Recent Labs Lab 08/13/15 1550 08/14/15 0614 08/15/15 0626 08/16/15 0640  CREATININE 0.52 0.49 0.49 0.48   Estimated Creatinine Clearance: 59 mL/min (by C-G formula based on Cr of 0.48).    Allergies  Allergen Reactions  . Ciprocinonide [Fluocinolone] Hives and Rash  . Ciprofloxacin Other (See Comments)    Blisters, boils  . Dust Mite Extract Itching  . Iohexol Hives       . Nsaids Other (See Comments)    Due to Hx of gastric bypass    Antimicrobials this admission: Fortaz 12/12 >>12/15 Vanc 12/12 >>  Levels/dose changes this admission: Vanc trough ordered 12/15 @ 2130  Microbiology results: 12/12 MRSA PCR - positive 12/12 sputum - MRSA 12/12 BCx x2 - ngtd 12/13 AFB / fungal - pending 12/13 Quantiferon Gold - pending  Thank you for allowing pharmacy to be a part of this patient's care.  Dimitri Ped, PharmD. PGY-1 Pharmacy Resident Pager: [phone] 08/16/2015 9:45 AM

## 2015-08-17 DIAGNOSIS — E43 Unspecified severe protein-calorie malnutrition: Secondary | ICD-10-CM | POA: Insufficient documentation

## 2015-08-17 DIAGNOSIS — F17210 Nicotine dependence, cigarettes, uncomplicated: Secondary | ICD-10-CM

## 2015-08-17 DIAGNOSIS — B9562 Methicillin resistant Staphylococcus aureus infection as the cause of diseases classified elsewhere: Secondary | ICD-10-CM

## 2015-08-17 DIAGNOSIS — J15212 Pneumonia due to Methicillin resistant Staphylococcus aureus: Secondary | ICD-10-CM

## 2015-08-17 DIAGNOSIS — A4902 Methicillin resistant Staphylococcus aureus infection, unspecified site: Secondary | ICD-10-CM

## 2015-08-17 DIAGNOSIS — Y95 Nosocomial condition: Secondary | ICD-10-CM

## 2015-08-17 LAB — BASIC METABOLIC PANEL
Anion gap: 6 (ref 5–15)
CHLORIDE: 94 mmol/L — AB (ref 101–111)
CO2: 37 mmol/L — AB (ref 22–32)
CREATININE: 0.42 mg/dL — AB (ref 0.44–1.00)
Calcium: 8.4 mg/dL — ABNORMAL LOW (ref 8.9–10.3)
GFR calc Af Amer: >60 mL/min (ref >60)
GFR calc non Af Amer: >60 mL/min (ref >60)
Glucose, Bld: 85 mg/dL (ref 65–99)
Potassium: 3.3 mmol/L — ABNORMAL LOW (ref 3.5–5.1)
Sodium: 137 mmol/L (ref 135–145)

## 2015-08-17 LAB — QUANTIFERON IN TUBE
QFT TB AG MINUS NIL VALUE: 0.01 IU/mL
QUANTIFERON MITOGEN VALUE: 0.07 [IU]/mL
QUANTIFERON NIL VALUE: 0.04 [IU]/mL
QUANTIFERON TB AG VALUE: 0.05 IU/mL
QUANTIFERON TB GOLD: UNDETERMINED

## 2015-08-17 LAB — QUANTIFERON TB GOLD ASSAY (BLOOD)

## 2015-08-17 LAB — MAGNESIUM: Magnesium: 1.9 mg/dL (ref 1.7–2.4)

## 2015-08-17 MED ORDER — UNJURY CHICKEN SOUP POWDER
2.0000 [oz_av] | ORAL | Status: DC
  Administered 2015-08-18 – 2015-08-19 (×2): 2 [oz_av] via ORAL
  Filled 2015-08-17 (×3): qty 27

## 2015-08-17 MED ORDER — IPRATROPIUM-ALBUTEROL 0.5-2.5 (3) MG/3ML IN SOLN
3.0000 mL | Freq: Three times a day (TID) | RESPIRATORY_TRACT | Status: DC
  Administered 2015-08-18 – 2015-08-20 (×8): 3 mL via RESPIRATORY_TRACT
  Filled 2015-08-17 (×9): qty 3

## 2015-08-17 MED ORDER — VANCOMYCIN HCL IN DEXTROSE 1-5 GM/200ML-% IV SOLN
1000.0000 mg | INTRAVENOUS | Status: DC
  Filled 2015-08-17: qty 200

## 2015-08-17 MED ORDER — POTASSIUM CHLORIDE CRYS ER 20 MEQ PO TBCR
40.0000 meq | EXTENDED_RELEASE_TABLET | ORAL | Status: AC
  Administered 2015-08-17 (×2): 40 meq via ORAL
  Filled 2015-08-17: qty 2

## 2015-08-17 MED ORDER — ENSURE ENLIVE PO LIQD
237.0000 mL | Freq: Three times a day (TID) | ORAL | Status: DC
  Administered 2015-08-17 – 2015-08-20 (×6): 237 mL via ORAL

## 2015-08-17 MED ORDER — LINEZOLID NICU ORAL SYRINGE 100 MG/5 ML: 600.0000 mg | Freq: Two times a day (BID) | ORAL | Status: DC

## 2015-08-17 MED ORDER — LINEZOLID 600 MG PO TABS
600.0000 mg | ORAL_TABLET | Freq: Two times a day (BID) | ORAL | Status: DC
  Administered 2015-08-17 – 2015-08-20 (×5): 600 mg via ORAL
  Filled 2015-08-17 (×10): qty 1

## 2015-08-17 NOTE — Progress Notes (Signed)
PROGRESS NOTE  Autumn Turner [id number] DOB: [birth year] DOA: 08/13/2015 PCP: Robyn Haber, MD  Brief History 54 year old female with a history of gastric bypass, peptic ulcer disease, hypertension, anxiety presented with one-week history of productive cough, dyspnea, fatigue, and subjective fever. The patient has had intermittent nausea and vomiting with her last episode on 08/10/2015. Patient denies any headache, neck pain, hemoptysis, chest pain, dysuria, hematuria, hematochezia, melena. Patient denies any recent sick contacts. Upon presentation, chest x-ray revealed multifocal consolidations right greater than left lung. She was noted to have WBC 16.4 and lactic acid 1.22. The patient was started on intravenous antibiotics pending culture data.   Subjective: Continues to cough - no dyspnea.   Assessment/Plan: Sepsis- right lung Necrotizing pneumonia with parapneumonic effusion - MRSA - sputum culture>> MRSA- d/c'd Fortaz- cont Vancomycin- recommend switch to Zyvox- consulted ID for outpt f/u - will need to re-scan in 2 wks to determine when to stop antibiotics -08/13/2015 CT chest multilobar infiltrates, right greater than left with area of necrosis in the right upper lobe apex -Appreciate pulmonary-->thoracocentesis on 08/14/15-->5894 WBC (99% PMN)- no organisms seen -MRSA nasal swab is positive - AFB sputum x 1 is negative- DC airborne precautions- QuantiFERON Gold P - HIV non-reactive   Acute respiratory failure with hypoxia -Secondary to pneumonia in the setting of underlying COPD -  2 L nasal cannula at baseline - currently on 4 -5 L  Chronic abdominal pain -h/o gastric bypass with strictures at her anastomotic site status post balloon angioplasty and stent  07/17/2015 -Pain improving -Advanced diet to solid food  Hypokalemia/hypomagnesemia -Repleting daily  Protein calorie malnutrition - Ensure added  AOCD -Iron saturation 3%, ferritin 85, TIBC not  elevated -Given IV dose Feraheme 12/14 - transfuse if Hb < 7  Thrombocytosis -Related to acute phase reaction as well as suspected iron deficiency anemia   Family Communication: Pt at beside Disposition Plan: PT eval- likely needs SNF   Procedures/Studies: Reviewed  Objective: Filed Vitals:   08/16/15 2100 08/16/15 2131 08/17/15 0508 08/17/15 1116  BP: 91/44  113/70 133/79  Pulse: 79  89 92  Temp: 98.4 F (36.9 C)  98.4 F (36.9 C) 97.9 F (36.6 C)  TempSrc: Oral  Oral Oral  Resp: 18  17 16   Height:      Weight:   47.038 kg (103 lb 11.2 oz)   SpO2: 100% 95% 92% 92%    Intake/Output Summary (Last 24 hours) at 08/17/15 1258 Last data filed at 08/17/15 1100  Gross per 24 hour  Intake    800 ml  Output    950 ml  Net   -150 ml   Weight change: -0.59 kg (-1 lb 4.8 oz) Exam:   General:  Pt is alert, follows commands appropriately, not in acute distress  HEENT: No icterus, No thrush, No neck mass, [place]/AT  Cardiovascular: RRR, S1/S2, no rubs, no gallops  Respiratory: crackles in RM lung field- 4 L O2- pulse ox 93%  Abdomen: Soft/+BS, non tender, non distended, no guarding; mild epigastric tenderness without any rebound   Extremities: No edema, No lymphangitis, No petechiae, No rashes, no synovitis  Data Reviewed: Basic Metabolic Panel:  Recent Labs Lab 08/13/15 1550 08/13/15 2146 08/14/15 0614 08/15/15 0626 08/16/15 0640 08/17/15 0600  NA 141  --  138 139 138 137  K 2.6*  --  3.3* 3.1* 3.0* 3.3*  CL 103  --  104 104 98* 94*  CO2 29  --  25 28 31  37*  GLUCOSE 65  --  59* 86 76 85  BUN 13  --  9 5* <5* <5*  CREATININE 0.52  --  0.49 0.49 0.48 0.42*  CALCIUM 8.2*  --  7.8* 8.1* 8.3* 8.4*  MG  --  2.0  --  1.6* 2.1 1.9  PHOS  --  3.4  --   --   --   --    Liver Function Tests:  Recent Labs Lab 08/14/15 0614 08/14/15 1558 08/15/15 0626 08/16/15 0640  AST 14*  --  11* 15  ALT 10*  --  8* 9*  ALKPHOS 217*  --  176* 172*  BILITOT 0.3  --  0.5  0.7  PROT 4.5* 4.4* 4.0* 4.5*  ALBUMIN 1.3*  --  1.1* 1.1*   No results for input(s): LIPASE, AMYLASE in the last 168 hours. No results for input(s): AMMONIA in the last 168 hours. CBC:  Recent Labs Lab 08/13/15 1550 08/14/15 0614 08/15/15 0626 08/16/15 0640  WBC 16.4* 11.7* 9.7 8.8  NEUTROABS 15.2* 10.5* 9.0* 8.0*  HGB 8.2* 7.4* 6.9* 7.1*  HCT 27.2* 24.7* 22.0* 23.2*  MCV 80.2 80.2 79.7 82.0  PLT 596* 522* 467* 463*   Cardiac Enzymes: No results for input(s): CKTOTAL, CKMB, CKMBINDEX, TROPONINI in the last 168 hours. BNP: Invalid input(s): POCBNP CBG:  Recent Labs Lab 08/14/15 1106  GLUCAP 73       Scheduled Meds: . buPROPion  300 mg Oral Daily  . Chlorhexidine Gluconate Cloth  6 each Topical Q0600  . cholecalciferol  1,000 Units Oral Daily  . feeding supplement (ENSURE ENLIVE)  237 mL Oral TID BM  . ipratropium-albuterol  3 mL Nebulization Q6H  . ketorolac  15 mg Intravenous Once  . multivitamin with minerals  1 tablet Oral Daily  . mupirocin ointment  1 application Nasal BID  . pantoprazole  40 mg Oral Daily  . potassium chloride  40 mEq Oral Q4H  . protein supplement  2 oz Oral BID PC  . sodium chloride  3 mL Intravenous Q12H  . vancomycin  1,000 mg Intravenous Q24H  . vitamin B-12  1,000 mcg Oral Daily  . vitamin C  500 mg Oral Daily   Continuous Infusions:    Debbe Odea, MD [facility] Pager : [email or web address] Password [email or web address] 08/17/2015, 12:58 PM   LOS: 4 days

## 2015-08-17 NOTE — Consult Note (Signed)
[facility] for Infect[facility]   Date: 08/17/2015               Patient Name:  Autumn Turner MRN: [id number]  DOB: 1960-12-21 Age / Sex: 54 y.o., female   PCP: Robyn Haber, MD         Requesting Physician: Dr. Debbe Odea, MD    Consulting Reason:  Right lung necrotizing MRSA PNA       History of Present Illness: Autumn Turner is a 54 year old lady with PMH of HTN, Gastric bypass with gastric outlet obstruction (s/p balloon dilation and stent), peptic ulcer disease, and smoking history who presented to the hospital on 08/13/15 with a 1 week history of fevers (highest at home of 103), cough, dyspnea, and myalgias. In the ED, she was hypoxic and started on O2 via [place]. CXR showed a multilobar PNA with area of cavitation in the right upper lobe. She was started on IV Ceftazidime and Vancomycin for HCAP.  CT Chest on 08/14/2015 showed severe multilobar PNA in the bilateral lungs (right > left), with large necrosis in the apex of the RUL, and b/l parapneumonic pleural effusions. PCCM was consulted, thoracentesis of right pleural effusion done. Her pleural fluid showed 5894 WBC, Cx (-). Her sputum Cx grew MRSA.    She denies any prior lung disease (PNA, COPD, asthma). Denies history of incarceration or homelessness. She does not use oxygen at home. She feels much improved now than when she came to the hospital, but still not at her baseline. She does report difficulty with oral intake, including pills, due to her GI issues.  Meds: Current Facility-Administered Medications  Medication Dose Route Frequency Provider Last Rate Last Dose  . albuterol (PROVENTIL) (2.5 MG/3ML) 0.083% nebulizer solution 2.5 mg  2.5 mg Nebulization Q2H PRN Chesley Mires, MD      . buPROPion (WELLBUTRIN XL) 24 hr tablet 300 mg  300 mg Oral Daily Reubin Milan, MD   300 mg at 08/17/15 0934  . Chlorhexidine Gluconate Cloth 2 % PADS 6 each  6 each Topical Q0600 Orson Eva, MD   6 each at 08/17/15 [phone]   . cholecalciferol (VITAMIN D) tablet 1,000 Units  1,000 Units Oral Daily Reubin Milan, MD   1,000 Units at 08/17/15 [phone]  . clidinium-chlordiazePOXIDE (LIBRAX) 2.5-5 mg per capsule  1 capsule Oral TID PRN Debbe Odea, MD      . diphenhydrAMINE (BENADRYL) tablet 12.5 mg  12.5 mg Oral QID PRN Reubin Milan, MD      . feeding supplement (ENSURE ENLIVE) (ENSURE ENLIVE) liquid 237 mL  237 mL Oral TID BM Debbe Odea, MD      . guaiFENesin (MUCINEX) 12 hr tablet 1,200 mg  1,200 mg Oral BID PRN Debbe Odea, MD      . HYDROmorphone (DILAUDID) injection 1 mg  1 mg Intravenous Q4H PRN Reubin Milan, MD   1 mg at 08/17/15 1332  . ipratropium-albuterol (DUONEB) 0.5-2.5 (3) MG/3ML nebulizer solution 3 mL  3 mL Nebulization Q6H Reubin Milan, MD   3 mL at 08/17/15 0850  . ketorolac (TORADOL) 15 MG/ML injection 15 mg  15 mg Intravenous Once Erick Colace, NP   15 mg at 08/14/15 1430  . linezolid (ZYVOX) tablet 600 mg  600 mg Oral Q12H Saima Rizwan, MD      . loratadine (CLARITIN) tablet 10 mg  10 mg Oral Daily PRN Reubin Milan, MD      .  magic mouthwash  10 mL Oral TID PRN Debbe Odea, MD      . multivitamin with minerals tablet 1 tablet  1 tablet Oral Daily Lorenda Peck, RD   1 tablet at 08/17/15 0934  . mupirocin ointment (BACTROBAN) 2 % 1 application  1 application Nasal BID Orson Eva, MD   1 application at [id number]  . ondansetron (ZOFRAN) tablet 4 mg  4 mg Oral Q6H PRN Reubin Milan, MD       Or  . ondansetron [facility]) injection 4 mg  4 mg Intravenous Q6H PRN Reubin Milan, MD   4 mg at 08/14/15 0349  . pantoprazole (PROTONIX) EC tablet 40 mg  40 mg Oral Daily Assunta Found Stone, RPH   40 mg at 08/17/15 [id number]  . protein supplement (UNJURY CHICKEN SOUP) powder 2 oz  2 oz Oral BID PC Lorenda Peck, RD   2 oz at 08/17/15 0934  . sodium chloride 0.9 % injection 3 mL  3 mL Intravenous Q12H Reubin Milan, MD   3 mL at 08/17/15 1000  . vitamin B-12  (CYANOCOBALAMIN) tablet 1,000 mcg  1,000 mcg Oral Daily Reubin Milan, MD   1,000 mcg at 08/17/15 0934  . vitamin C (ASCORBIC ACID) tablet 500 mg  500 mg Oral Daily Reubin Milan, MD   500 mg at 08/17/15 [id number]    Allergies: Allergies as of 08/13/2015 - Review Complete 08/13/2015  Allergen Reaction Noted  . Ciprocinonide [fluocinolone] Hives and Rash 08/26/2012  . Ciprofloxacin Other (See Comments) 12/20/2014  . Dust mite extract Itching 12/20/2014  . Iohexol Hives 08/27/2009  . Nsaids Other (See Comments) 11/17/2012   Past Medical History  Diagnosis Date  . Abnormal Pap smear   . Vulvar lesion   . CIN II (cervical intraepithelial neoplasia II)   . Malnourished ([place])   . Sleep apnea     Loss weight with gastric bypass  . Cancer ([place])     vulvar  . Anemia     blood transfusion  . Hypertension     no longer on medication  . Anxiety   . Depression   . History of stomach ulcers    Past Surgical History  Procedure Laterality Date  . Cesarean section    . Gastric bypass  2001  . Gallbladder surgery  2002  . Tummy tuck  2004  . Leep  06/2009  . Cholecystectomy    . Tonsillectomy      age 41 adnoids  . Vulvectomy Bilateral 12/07/2012    Procedure: VULVECTOMY AND BILATERAL INGUINAL LYMPH NODE DISSECTION, PAP SMEAR;  Surgeon: Janie Morning, MD;  Location: [facility] ORS;  Service: Gynecology;  Laterality: Bilateral;  . Revision to gastric bypass    . Esophagogastroduodenoscopy N/A 06/29/2015    Procedure: ESOPHAGOGASTRODUODENOSCOPY (EGD);  Surgeon: Clarene Essex, MD;  Location: [facility] ENDOSCOPY;  Service: Endoscopy;  Laterality: N/A;  . Balloon dilation N/A 06/29/2015    Procedure: BALLOON DILATION;  Surgeon: Clarene Essex, MD;  Location: [facility] ENDOSCOPY;  Service: Endoscopy;  Laterality: N/A;  . Esophagogastroduodenoscopy (egd) with propofol N/A 07/17/2015    Procedure: ESOPHAGOGASTRODUODENOSCOPY (EGD) WITH PROPOFOL;  Surgeon: Clarene Essex, MD;  Location: [facility] ENDOSCOPY;  Service: Endoscopy;   Laterality: N/A;  . Balloon dilation N/A 07/17/2015    Procedure: BALLOON DILATION;  Surgeon: Clarene Essex, MD;  Location: [facility] ENDOSCOPY;  Service: Endoscopy;  Laterality: N/A;  . Esophageal stent placement N/A 07/17/2015    Procedure: ESOPHAGEAL STENT PLACEMENT;  Surgeon:  Clarene Essex, MD;  Location: [facility] ENDOSCOPY;  Service: Endoscopy;  Laterality: N/A;   Family History  Problem Relation Age of Onset  . Cancer Father   . Hypertension Father   . Lung cancer Father    Social History   Social History  . Marital Status: Married    Spouse Name: N/A  . Number of Children: N/A  . Years of Education: N/A   Occupational History  . Not on file.   Social History Main Topics  . Smoking status: Current Some Day Smoker -- 0.50 packs/day for 20 years    Types: Cigarettes  . Smokeless tobacco: Never Used  . Alcohol Use: No  . Drug Use: No  . Sexual Activity: Not on file   Other Topics Concern  . Not on file   Social History Narrative    Review of Systems: Review of Systems  Constitutional: Negative for fever, chills and diaphoresis.  Respiratory: Positive for cough and shortness of breath. Negative for hemoptysis, sputum production and wheezing.   Cardiovascular: Negative for chest pain, palpitations and leg swelling.  Gastrointestinal: Negative for nausea, vomiting, abdominal pain, diarrhea, constipation and blood in stool.  Genitourinary: Negative for dysuria and hematuria.  Musculoskeletal: Positive for myalgias.  Skin: Negative for rash.  Neurological: Negative for dizziness and headaches.     Physical Exam: Blood pressure 133/79, pulse 92, temperature 97.9 F (36.6 C), temperature source Oral, resp. rate 16, height 5\' 4"  (1.626 m), weight 103 lb 11.2 oz (47.038 kg), SpO2 92 %. Physical Exam  Constitutional: She is oriented to person, place, and time. No distress.  Thin pleasant lady on 4L nasal cannula, saturating 90-92%  HENT:  Head: Normocephalic and atraumatic.    Mouth/Throat: Oropharynx is clear and moist.  Neck: Normal range of motion. Neck supple.  Cardiovascular: Normal rate, regular rhythm and intact distal pulses.   No murmur heard. Pulmonary/Chest: Effort normal. No respiratory distress.  Crackles heard at right upper and middle lobes  Abdominal: Soft. There is no tenderness.  Musculoskeletal: Normal range of motion. She exhibits no edema or tenderness.  Lymphadenopathy:    She has no cervical adenopathy.  Neurological: She is alert and oriented to person, place, and time.  Skin: Skin is warm. No rash noted. She is not diaphoretic.     Lab results: Basic Metabolic Panel:  Recent Labs  08/16/15 0640 08/17/15 0600  NA 138 137  K 3.0* 3.3*  CL 98* 94*  CO2 31 37*  GLUCOSE 76 85  BUN <5* <5*  CREATININE 0.48 0.42*  CALCIUM 8.3* 8.4*  MG 2.1 1.9   Liver Function Tests:  Recent Labs  08/15/15 0626 08/16/15 0640  AST 11* 15  ALT 8* 9*  ALKPHOS 176* 172*  BILITOT 0.5 0.7  PROT 4.0* 4.5*  ALBUMIN 1.1* 1.1*   No results for input(s): LIPASE, AMYLASE in the last 72 hours. No results for input(s): AMMONIA in the last 72 hours. CBC:  Recent Labs  08/15/15 0626 08/16/15 0640  WBC 9.7 8.8  NEUTROABS 9.0* 8.0*  HGB 6.9* 7.1*  HCT 22.0* 23.2*  MCV 79.7 82.0  PLT 467* 463*   Cardiac Enzymes: No results for input(s): CKTOTAL, CKMB, CKMBINDEX, TROPONINI in the last 72 hours. BNP: No results for input(s): PROBNP in the last 72 hours. D-Dimer: No results for input(s): DDIMER in the last 72 hours. CBG: No results for input(s): GLUCAP in the last 72 hours. Hemoglobin A1C: No results for input(s): HGBA1C in the last 72 hours.  Fasting Lipid Panel: No results for input(s): CHOL, HDL, LDLCALC, TRIG, CHOLHDL, LDLDIRECT in the last 72 hours. Thyroid Function Tests: No results for input(s): TSH, T4TOTAL, FREET4, T3FREE, THYROIDAB in the last 72 hours. Anemia Panel: No results for input(s): VITAMINB12, FOLATE, FERRITIN,  TIBC, IRON, RETICCTPCT in the last 72 hours. Coagulation: No results for input(s): LABPROT, INR in the last 72 hours. Urine Drug Screen: Drugs of Abuse  No results found for: LABOPIA, COCAINSCRNUR, LABBENZ, AMPHETMU, THCU, LABBARB  Alcohol Level: No results for input(s): ETH in the last 72 hours. Urinalysis: No results for input(s): COLORURINE, LABSPEC, PHURINE, GLUCOSEU, HGBUR, BILIRUBINUR, KETONESUR, PROTEINUR, UROBILINOGEN, NITRITE, LEUKOCYTESUR in the last 72 hours.  Invalid input(s): APPERANCEUR  Imaging results:  Dg Chest 2 View  08/16/2015  CLINICAL DATA:  Cough, shortness of breath, pneumonia. EXAM: CHEST  2 VIEW COMPARISON:  Portable chest x-ray of August 14, 2015 FINDINGS: On the right the confluent interstitial and alveolar opacities in the mid and lower lung are stable. There is a stable cavity in the right upper lobe. There are slightly increased interstitial densities in the left mid and lower lung. There is a small left pleural effusion. The heart is not enlarged. There is stable right hilar prominence. A stent like device is visible in the left upper quadrant of the abdomen and its positioning has been described on the previous CT scan. IMPRESSION: 1. Stable airspace opacity on the right consistent with pneumonia. 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 Slight interval increase in interstitial densities in the left mid and lower lung may reflect progressive pneumonia here. 2. There is no pulmonary vascular congestion. There is a small left pleural effusion. Electronically Signed   By: David  Martinique M.D.   On: 08/16/2015 09:07   .  Assessment, Plan, & Recommendations by Problem: Principal Problem:   HCAP (healthcare-associated pneumonia) Active Problems:   Hypokalemia   Anemia   Abdominal pain   Acute respiratory failure with hypoxia (HCC)   Necrotizing pneumonia (HCC)   Microcytic anemia   Sepsis (HCC)   Pleural effusion   Protein-calorie  malnutrition, severe   Pneumonia of right lung due to methicillin resistant Staphylococcus aureus (MRSA) ([place])  Right lung necrotizing MRSA PNA: No prior lung disease, 0.5 PPD smoker for 30+ years. She was admitted for possible HCAP. PNA possibly from aspiration. Another consideration is MAI. She reports difficulty with taking pills due to her gastric outlet obstruction, will try liquid Linezolid.  -d/c Vancomycin -Start oral Linezolid solution for at least 2 weeks. Have to be careful to watch for neuropathy and thrombocytopenia. Cost may also be an issue. If Linezolid not an option, consider Doxycycline. -f/u AFB -f/u with ID Clinic in 2 weeks for repeat films     Signed: Zada Finders, MD 08/17/2015, 4:40 PM

## 2015-08-17 NOTE — Consult Note (Signed)
[facility] for Infectious Disease  Date of Admission:  08/13/2015  Date of Consult:  08/17/2015  Reason for Consult: MRSA pneumonia  Referring Physician: Wynelle Cleveland  Impression/Recommendation MRSA Pneumonia ? HCAP, aspiration  Would start po zyvox liquid.  Be very cautious- she has a hx of neuropathy (which zyvox can worsen).  I warned her regarding this as well as that it can cause low platelets (nose and gum bleeds).  If she is not able to afford this, would give her doxy.  Will see her back in ID clinic in 2 weeks for repeat films.  Will need to watch for clearance, assess for mass Also some concern that this could be MAI, await AFB Cx.    Thank you so much for this interesting consult,   Bobby Rumpf (pager) [phone] [email or web address]  Autumn Turner is an 54 y.o. female.  HPI: 54 yo F with hx of intractable n/v, gastric outlet obstruction, prev gastric bypass. Underwent ballon dilation 07-2015 but continued to have n/v.  She returns 12-12 with 1 week of cough, green sputum, fatigue, and fevers to 103. She was found to have cavitary PNA on CXR and CT. She was treated with vanco/zosyn and has improved. She was noted to have R effusion- this was tapped and showed >5000 WBC, Cx was negative. Cytology was (-). Her sputum Cx grew MRSA.   She denies hx of TB exposures- never homeless, neve incarcerated, no FHx TB.   Past Medical History  Diagnosis Date  . Abnormal Pap smear   . Vulvar lesion   . CIN II (cervical intraepithelial neoplasia II)   . Malnourished ([place])   . Sleep apnea     Loss weight with gastric bypass  . Cancer ([place])     vulvar  . Anemia     blood transfusion  . Hypertension     no longer on medication  . Anxiety   . Depression   . History of stomach ulcers     Past Surgical History  Procedure Laterality Date  . Cesarean section    . Gastric bypass  2001  . Gallbladder surgery  2002  . Tummy tuck  2004  . Leep  06/2009  .  Cholecystectomy    . Tonsillectomy      age 14 adnoids  . Vulvectomy Bilateral 12/07/2012    Procedure: VULVECTOMY AND BILATERAL INGUINAL LYMPH NODE DISSECTION, PAP SMEAR;  Surgeon: Janie Morning, MD;  Location: [facility];  Service: Gynecology;  Laterality: Bilateral;  . Revision to gastric bypass    . Esophagogastroduodenoscopy N/A 06/29/2015    Procedure: ESOPHAGOGASTRODUODENOSCOPY (EGD);  Surgeon: Clarene Essex, MD;  Location: [facility] ENDOSCOPY;  Service: Endoscopy;  Laterality: N/A;  . Balloon dilation N/A 06/29/2015    Procedure: BALLOON DILATION;  Surgeon: Clarene Essex, MD;  Location: [facility] ENDOSCOPY;  Service: Endoscopy;  Laterality: N/A;  . Esophagogastroduodenoscopy (egd) with propofol N/A 07/17/2015    Procedure: ESOPHAGOGASTRODUODENOSCOPY (EGD) WITH PROPOFOL;  Surgeon: Clarene Essex, MD;  Location: [facility] ENDOSCOPY;  Service: Endoscopy;  Laterality: N/A;  . Balloon dilation N/A 07/17/2015    Procedure: BALLOON DILATION;  Surgeon: Clarene Essex, MD;  Location: [facility] ENDOSCOPY;  Service: Endoscopy;  Laterality: N/A;  . Esophageal stent placement N/A 07/17/2015    Procedure: ESOPHAGEAL STENT PLACEMENT;  Surgeon: Clarene Essex, MD;  Location: [facility] ENDOSCOPY;  Service: Endoscopy;  Laterality: N/A;     Allergies  Allergen Reactions  . Ciprocinonide [Fluocinolone] Hives and Rash  . Ciprofloxacin Other (See Comments)  Blisters, boils  . Dust Mite Extract Itching  . Iohexol Hives       . Nsaids Other (See Comments)    Due to Hx of gastric bypass    Medications:  Scheduled: . buPROPion  300 mg Oral Daily  . Chlorhexidine Gluconate Cloth  6 each Topical Q0600  . cholecalciferol  1,000 Units Oral Daily  . feeding supplement (ENSURE ENLIVE)  237 mL Oral TID BM  . ipratropium-albuterol  3 mL Nebulization Q6H  . ketorolac  15 mg Intravenous Once  . multivitamin with minerals  1 tablet Oral Daily  . mupirocin ointment  1 application Nasal BID  . pantoprazole  40 mg Oral Daily  . protein supplement  2 oz Oral BID  PC  . sodium chloride  3 mL Intravenous Q12H  . vancomycin  1,000 mg Intravenous Q24H  . vitamin B-12  1,000 mcg Oral Daily  . vitamin C  500 mg Oral Daily    Abtx:  Anti-infectives    Start     Dose/Rate Route Frequency Ordered Stop   08/17/15 2300  vancomycin (VANCOCIN) IVPB 1000 mg/200 mL premix     1,000 mg 200 mL/hr over 60 Minutes Intravenous Every 24 hours 08/17/15 1037     08/13/15 1700  cefTAZidime (FORTAZ) 2 g in dextrose 5 % 50 mL IVPB  Status:  Discontinued     2 g 100 mL/hr over 30 Minutes Intravenous Every 8 hours 08/13/15 1700 08/16/15 0836   08/13/15 1700  cefTAZidime (FORTAZ) 2 g in dextrose 5 % 50 mL IVPB  Status:  Discontinued     2 g 100 mL/hr over 30 Minutes Intravenous 3 times per day 08/13/15 1648 08/13/15 1700   08/13/15 1700  vancomycin (VANCOCIN) IVPB 1000 mg/200 mL premix  Status:  Discontinued     1,000 mg 200 mL/hr over 60 Minutes Intravenous Every 24 hours 08/13/15 1659 08/17/15 1037      Total days of antibiotics: 3 vanco/zosyn          Social History:  reports that she has been smoking Cigarettes.  She has a 10 pack-year smoking history. She has never used smokeless tobacco. She reports that she does not drink alcohol or use illicit drugs.  Family History  Problem Relation Age of Onset  . Cancer Father   . Hypertension Father   . Lung cancer Father     General ROS: anorexia+, n/v+. denies hx of lung disease. no LAN. +neuropathy. 12 point ROS o/w (-), see HPI.   Blood pressure 133/79, pulse 92, temperature 97.9 F (36.6 C), temperature source Oral, resp. rate 16, height '5\' 4"'$  (1.626 m), weight 47.038 kg (103 lb 11.2 oz), SpO2 92 %. General appearance: alert, cooperative, cachectic and no distress Eyes: negative findings: conjunctivae and sclerae normal and pupils equal, round, reactive to light and accomodation Throat: normal findings: oropharynx pink & moist without lesions or evidence of thrush Neck: no adenopathy and supple, symmetrical,  trachea midline Lungs: diminished breath sounds anterior - right and rhonchi anterior - right Heart: regular rate and rhythm Abdomen: normal findings: bowel sounds normal and soft, non-tender Extremities: edema none Lymph nodes: Cervical, supraclavicular, and axillary nodes normal.   Results for orders placed or performed during the hospital encounter of 08/13/15 (from the past 48 hour(s))  Comprehensive metabolic panel     Status: Abnormal   Collection Time: 08/16/15  6:40 AM  Result Value Ref Range   Sodium 138 135 - 145 mmol/L   Potassium  3.0 (L) 3.5 - 5.1 mmol/L   Chloride 98 (L) 101 - 111 mmol/L   CO2 31 22 - 32 mmol/L   Glucose, Bld 76 65 - 99 mg/dL   BUN <5 (L) 6 - 20 mg/dL   Creatinine, Ser 0.48 0.44 - 1.00 mg/dL   Calcium 8.3 (L) 8.9 - 10.3 mg/dL   Total Protein 4.5 (L) 6.5 - 8.1 g/dL   Albumin 1.1 (L) 3.5 - 5.0 g/dL   AST 15 15 - 41 U/L   ALT 9 (L) 14 - 54 U/L   Alkaline Phosphatase 172 (H) 38 - 126 U/L   Total Bilirubin 0.7 0.3 - 1.2 mg/dL   GFR calc non Af Amer >60 >60 mL/min   GFR calc Af Amer >60 >60 mL/min    Comment: (NOTE) The eGFR has been calculated using the CKD EPI equation. This calculation has not been validated in all clinical situations. eGFR's persistently <60 mL/min signify possible Chronic Kidney Disease.    Anion gap 9 5 - 15  CBC WITH DIFFERENTIAL     Status: Abnormal   Collection Time: 08/16/15  6:40 AM  Result Value Ref Range   WBC 8.8 4.0 - 10.5 K/uL   RBC 2.83 (L) 3.87 - 5.11 MIL/uL   Hemoglobin 7.1 (L) 12.0 - 15.0 g/dL   HCT 23.2 (L) 36.0 - 46.0 %   MCV 82.0 78.0 - 100.0 fL   MCH 25.1 (L) 26.0 - 34.0 pg   MCHC 30.6 30.0 - 36.0 g/dL   RDW 16.4 (H) 11.5 - 15.5 %   Platelets 463 (H) 150 - 400 K/uL   Neutrophils Relative % 91 %   Neutro Abs 8.0 (H) 1.7 - 7.7 K/uL   Lymphocytes Relative 4 %   Lymphs Abs 0.3 (L) 0.7 - 4.0 K/uL   Monocytes Relative 4 %   Monocytes Absolute 0.4 0.1 - 1.0 K/uL   Eosinophils Relative 1 %   Eosinophils  Absolute 0.1 0.0 - 0.7 K/uL   Basophils Relative 0 %   Basophils Absolute 0.0 0.0 - 0.1 K/uL  Magnesium     Status: None   Collection Time: 08/16/15  6:40 AM  Result Value Ref Range   Magnesium 2.1 1.7 - 2.4 mg/dL  Basic metabolic panel     Status: Abnormal   Collection Time: 08/17/15  6:00 AM  Result Value Ref Range   Sodium 137 135 - 145 mmol/L   Potassium 3.3 (L) 3.5 - 5.1 mmol/L   Chloride 94 (L) 101 - 111 mmol/L   CO2 37 (H) 22 - 32 mmol/L   Glucose, Bld 85 65 - 99 mg/dL   BUN <5 (L) 6 - 20 mg/dL   Creatinine, Ser 0.42 (L) 0.44 - 1.00 mg/dL   Calcium 8.4 (L) 8.9 - 10.3 mg/dL   GFR calc non Af Amer >60 >60 mL/min   GFR calc Af Amer >60 >60 mL/min    Comment: (NOTE) The eGFR has been calculated using the CKD EPI equation. This calculation has not been validated in all clinical situations. eGFR's persistently <60 mL/min signify possible Chronic Kidney Disease.    Anion gap 6 5 - 15  Magnesium     Status: None   Collection Time: 08/17/15  6:00 AM  Result Value Ref Range   Magnesium 1.9 1.7 - 2.4 mg/dL      Component Value Date/Time   SDES FLUID PLEURAL 08/14/2015 1345   SDES FLUID PLEURAL 08/14/2015 1345   SPECREQUEST Normal 08/14/2015 1345  SPECREQUEST NONE 08/14/2015 1345   CULT NO GROWTH 3 DAYS 08/14/2015 1345   CULT  08/14/2015 1345    CULTURE IN PROGRESS FOR FOUR WEEKS Performed at [facility] PENDING 08/14/2015 1345   REPTSTATUS PENDING 08/14/2015 1345   Dg Chest 2 View  08/16/2015  CLINICAL DATA:  Cough, shortness of breath, pneumonia. EXAM: CHEST  2 VIEW COMPARISON:  Portable chest x-ray of August 14, 2015 FINDINGS: On the right the confluent interstitial and alveolar opacities in the mid and lower lung are stable. There is a stable cavity in the right upper lobe. There are slightly increased interstitial densities in the left mid and lower lung. There is a small left pleural effusion. The heart is not enlarged. There is stable right  hilar prominence. A stent like device is visible in the left upper quadrant of the abdomen and its positioning has been described on the previous CT scan. IMPRESSION: 1. Stable airspace opacity on the right consistent with pneumonia. 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 Slight interval increase in interstitial densities in the left mid and lower lung may reflect progressive pneumonia here. 2. There is no pulmonary vascular congestion. There is a small left pleural effusion. Electronically Signed   By: David  Martinique M.D.   On: 08/16/2015 09:07   Recent Results (from the past 240 hour(s))  Culture, blood (routine x 2) Call MD if unable to obtain prior to antibiotics being given     Status: None (Preliminary result)   Collection Time: 08/13/15  9:40 PM  Result Value Ref Range Status   Specimen Description BLOOD RIGHT ARM  Final   Special Requests BOTTLES DRAWN AEROBIC AND ANAEROBIC 10ML  Final   Culture NO GROWTH 3 DAYS  Final   Report Status PENDING  Incomplete  Culture, blood (routine x 2) Call MD if unable to obtain prior to antibiotics being given     Status: None (Preliminary result)   Collection Time: 08/13/15  9:46 PM  Result Value Ref Range Status   Specimen Description BLOOD RIGHT ARM  Final   Special Requests BOTTLES DRAWN AEROBIC AND ANAEROBIC 10ML  Final   Culture NO GROWTH 3 DAYS  Final   Report Status PENDING  Incomplete  Culture, respiratory (NON-Expectorated)     Status: None   Collection Time: 08/13/15 10:12 PM  Result Value Ref Range Status   Specimen Description EXPECTORATED SPUTUM  Final   Special Requests NONE  Final   Gram Stain   Final    FEW WBC PRESENT, PREDOMINANTLY PMN NO SQUAMOUS EPITHELIAL CELLS SEEN ABUNDANT GRAM POSITIVE COCCI IN PAIRS IN CLUSTERS FEW GRAM POSITIVE RODS Performed at [facility]    Culture   Final    ABUNDANT METHICILLIN RESISTANT STAPHYLOCOCCUS AUREUS Note: RIFAMPIN AND GENTAMICIN SHOULD NOT BE USED AS  SINGLE DRUGS FOR TREATMENT OF STAPH INFECTIONS. This organism DOES NOT demonstrate inducible Clindamycin resistance in vitro. CRITICAL RESULT CALLED TO, READ BACK BY AND VERIFIED WITH: HEATHER S 12/15  '@0810'$  BY REAMM Performed at [facility]    Report Status 08/16/2015 FINAL  Final   Organism ID, Bacteria METHICILLIN RESISTANT STAPHYLOCOCCUS AUREUS  Final      Susceptibility   Methicillin resistant staphylococcus aureus - MIC*    CLINDAMYCIN <=0.25 SENSITIVE Sensitive     ERYTHROMYCIN >=8 RESISTANT Resistant     GENTAMICIN <=0.5 SENSITIVE Sensitive     LEVOFLOXACIN <=0.12 SENSITIVE Sensitive  OXACILLIN >=4 RESISTANT Resistant     RIFAMPIN <=0.5 SENSITIVE Sensitive     TRIMETH/SULFA <=10 SENSITIVE Sensitive     VANCOMYCIN 1 SENSITIVE Sensitive     TETRACYCLINE <=1 SENSITIVE Sensitive     * ABUNDANT METHICILLIN RESISTANT STAPHYLOCOCCUS AUREUS  MRSA PCR Screening     Status: Abnormal   Collection Time: 08/13/15 10:16 PM  Result Value Ref Range Status   MRSA by PCR POSITIVE (A) NEGATIVE Final    Comment:        The GeneXpert MRSA Assay (FDA approved for NASAL specimens only), is one component of a comprehensive MRSA colonization surveillance program. It is not intended to diagnose MRSA infection nor to guide or monitor treatment for MRSA infections. RESULT CALLED TO, READ BACK BY AND VERIFIED WITH: T PHILLIPS '@0108'$  08/14/15 MKELLY   AFB culture with smear     Status: None (Preliminary result)   Collection Time: 08/14/15 12:25 PM  Result Value Ref Range Status   Specimen Description SPUTUM  Final   Special Requests Immunocompromised  Final   Acid Fast Smear   Final    NO ACID FAST BACILLI SEEN Performed at [facility]    Culture   Final    CULTURE WILL BE EXAMINED FOR 6 WEEKS BEFORE ISSUING A FINAL REPORT Performed at [facility]    Report Status PENDING  Incomplete  Body fluid culture     Status: None (Preliminary result)   Collection Time:  08/14/15  1:45 PM  Result Value Ref Range Status   Specimen Description FLUID PLEURAL  Final   Special Requests Normal  Final   Gram Stain   Final    ABUNDANT WBC PRESENT,BOTH PMN AND MONONUCLEAR NO ORGANISMS SEEN    Culture NO GROWTH 3 DAYS  Final   Report Status PENDING  Incomplete  Fungus Culture with Smear     Status: None (Preliminary result)   Collection Time: 08/14/15  1:45 PM  Result Value Ref Range Status   Specimen Description FLUID PLEURAL  Final   Special Requests NONE  Final   Fungal Smear   Final    NO YEAST OR FUNGAL ELEMENTS SEEN Performed at [facility]    Culture   Final    CULTURE IN PROGRESS FOR FOUR WEEKS Performed at [facility]    Report Status PENDING  Incomplete      08/17/2015, 4:07 PM     LOS: 4 days    Records and images were personally reviewed where available.

## 2015-08-17 NOTE — Progress Notes (Signed)
Pt was viewed taking her oxygen off. O2 sat = 64% at this time. Pt placed back on 3L [place], with O2 sat increasing to 90%. RN increased O2 back to 4L [place], with O2 sat increasing to 94%. Humidifier and continuous pulse ox applied. Will continue to monitor.

## 2015-08-17 NOTE — Progress Notes (Signed)
Nutrition Follow-up  DOCUMENTATION CODES:   Severe malnutrition in context of chronic illness, Underweight  INTERVENTION:  Increase Ensure Enlive po to BID (and PRN), each supplement provides 350 kcal and 20 grams of protein Provide Un[place] shake (chicken broth flavor) once daily Provide snack once daily Provide Multivitamin with minerals daily   NUTRITION DIAGNOSIS:   Malnutrition related to chronic illness, vomiting as evidenced by energy intake < or equal to 75% for > or equal to 1 month, severe depletion of muscle mass, severe depletion of body fat.  Ongoing  GOAL:   Patient will meet greater than or equal to 90% of their needs  Unmet  MONITOR:   PO intake, Supplement acceptance, Labs, Skin  REASON FOR ASSESSMENT:   Malnutrition Screening Tool    ASSESSMENT:   54 year old female with a history of gastric bypass, peptic ulcer disease, hypertension, anxiety presented with one-week history of productive cough, dyspnea, fatigue, and subjective fever. The patient has had intermittent nausea and vomiting with her last episode on 08/10/2015.   Pt was advanced to a soft foods diet yesterday afternoon. She continues to eat 25% or less of meals. She relate poor PO intake to early satiety. She likes the Ensure [place] better than other supplements and is willing to increase intake. RD suggested snacking in between meals.   Labs: low potassium, low chloride, low calcium, low albumin  Diet Order:  DIET SOFT Room service appropriate?: Yes; Fluid consistency:: Thin  Skin:  Reviewed, no issues  Last BM:  12/12  Height:   Ht Readings from Last 1 Encounters:  08/14/15 5\' 4"  (1.626 m)    Weight:   Wt Readings from Last 1 Encounters:  08/17/15 103 lb 11.2 oz (47.038 kg)    Ideal Body Weight:  54.5 kg  BMI:  Body mass index is 17.79 kg/(m^2).  Estimated Nutritional Needs:   Kcal:  [id number]  Protein:  55-65 grams  Fluid:  1.4-1.6 L/day  EDUCATION NEEDS:   No  education needs identified at this time  [place], LDN Inpatient Clinical Dietitian Pager: [phone] After Hours Pager: [phone]

## 2015-08-18 DIAGNOSIS — E43 Unspecified severe protein-calorie malnutrition: Secondary | ICD-10-CM

## 2015-08-18 DIAGNOSIS — D509 Iron deficiency anemia, unspecified: Secondary | ICD-10-CM

## 2015-08-18 DIAGNOSIS — E876 Hypokalemia: Secondary | ICD-10-CM

## 2015-08-18 LAB — BODY FLUID CULTURE
Culture: NO GROWTH
SPECIAL REQUESTS: NORMAL

## 2015-08-18 LAB — BASIC METABOLIC PANEL
ANION GAP: 6 (ref 5–15)
CO2: 38 mmol/L — ABNORMAL HIGH (ref 22–32)
Calcium: 8.5 mg/dL — ABNORMAL LOW (ref 8.9–10.3)
Chloride: 93 mmol/L — ABNORMAL LOW (ref 101–111)
Creatinine, Ser: 0.33 mg/dL — ABNORMAL LOW (ref 0.44–1.00)
GFR calc Af Amer: >60 mL/min (ref >60)
Glucose, Bld: 85 mg/dL (ref 65–99)
POTASSIUM: 4.2 mmol/L (ref 3.5–5.1)
SODIUM: 137 mmol/L (ref 135–145)

## 2015-08-18 NOTE — Evaluation (Signed)
Physical Therapy Evaluation Patient Details Name: Autumn Turner MRN: [id number] DOB: 11-Sep-1960 Today's Date: 08/18/2015   History of Present Illness  54 yo female with onset of  HCAP and recent thoracentesis has MRSA PNA, hx gastric bypass.    Clinical Impression  Pt was seen for assessment of mobility and tolerance, with low O2 sats for time OOB and sitting.  Her coughing bouts bring down the sats, but also better with being up OOB as well.  Has declined to sit up to chair and asked to return to bed.    Follow Up Recommendations SNF    Equipment Recommendations  None recommended by PT (await SNF disposition)    Recommendations for Other Services Rehab consult     Precautions / Restrictions Precautions Precautions: Fall (isolation MRSA PNA) Restrictions Weight Bearing Restrictions: No      Mobility  Bed Mobility Overal bed mobility: Modified Independent                Transfers                 General transfer comment: unable to attempt  Ambulation/Gait             General Gait Details: unable  Stairs            Wheelchair Mobility    Modified Rankin (Stroke Patients Only)       Balance Overall balance assessment:  (sitting only mod I to control)                                           Pertinent Vitals/Pain Pain Assessment: No/denies pain    Home Living Family/patient expects to be discharged to:: Private residence Living Arrangements: Spouse/significant other;Other relatives Available Help at Discharge: Family;Available PRN/intermittently Type of Home: House                Prior Function Level of Independence: Independent               Hand Dominance        Extremity/Trunk Assessment   Upper Extremity Assessment: Generalized weakness           Lower Extremity Assessment: Generalized weakness      Cervical / Trunk Assessment: Normal  Communication   Communication: No  difficulties  Cognition Arousal/Alertness: Awake/alert Behavior During Therapy: Anxious Overall Cognitive Status: No family/caregiver present to determine baseline cognitive functioning                      General Comments General comments (skin integrity, edema, etc.): Pt was on O2 monitor and had readings from 94% in bed down to 88%, sitting from 83% to 95%.  Coughing would cause a decline    Exercises        Assessment/Plan    PT Assessment Patient needs continued PT services  PT Diagnosis Generalized weakness   PT Problem List Decreased strength;Decreased range of motion;Decreased activity tolerance;Decreased mobility;Cardiopulmonary status limiting activity;Decreased skin integrity  PT Treatment Interventions DME instruction;Gait training;Functional mobility training;Therapeutic activities;Therapeutic exercise;Balance training;Neuromuscular re-education;Patient/family education   PT Goals (Current goals can be found in the Care Plan section) Acute Rehab PT Goals Patient Stated Goal: to get stroinger PT Goal Formulation: With patient Time For Goal Achievement: 09/01/15 Potential to Achieve Goals: Good    Frequency Min 2X/week   Barriers to discharge Decreased caregiver support  husband is working    Insurance risk surveyor During Treatment: Oxygen Activity Tolerance: Patient limited by fatigue;Patient limited by lethargy;Treatment limited secondary to medical complications (Comment) (low O2 sats) Patient left: in bed;with call bell/phone within reach Nurse Communication: Mobility status         Time: 1034-1101 PT Time Calculation (min) (ACUTE ONLY): 27 min   Charges:   PT Evaluation $Initial PT Evaluation Tier I: 1 Procedure PT Treatments $Therapeutic Activity: 8-22 mins   PT G Codes:        Ramond Dial September 02, 2015, 12:00 PM   Mee Hives, PT MS Acute Rehab Dept. Number: [phone] [phone] and [place]  [phone]

## 2015-08-18 NOTE — Progress Notes (Addendum)
[facility] PROGRESS NOTE    Progress Note   Autumn Turner [id number] DOB: [birth year] DOA: 08/13/2015 PCP: Robyn Haber, MD   Brief Narrative:   Autumn Turner is an 54 y.o. female past medical history of gastric bypass and peptic ulcer disease presented to the hospital with 1 week history of productive cough dyspnea fatigue and fever. A chest x-ray was done in the emergency room showed multifocal consolidation, white blood cell count of 16 and lack of 1.2, he was started empirically on antibiotics.  Assessment/Plan:   Sepsis (HCC)/HCAP (healthcare-associated pneumonia)/  Pleural effusion: CT of the chest done on 08/13/2015 showed multilobar infiltrates right greater than left. On admission he was started on vancomycin and Fortaz, pulmonary was consulted who recommended a thoracocentesis done on 08/14/2015 that showed greater than 5000 white blood cells 99% of them which were PMN no organisms were seen. Sputum culture grew MRSA vancomycin was DC'd and ID was consulted who recommended to switch to Zyvox. HIV was negative. Has remained afebrile. PT eval is pending. Will need a repeated CT scan in 2 weeks.  Acute respiratory failure with hypoxia: Likely due to necrotizing pneumonia in the setting of underlying COPD. 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 He is tolerating his diet.  Hypokalemia/hypomagnesemia: Continue oral repletion daily.  Moderate protein caloric malnutrition: Continue ensure.  Microcytic anemia Due to iron deficiency was given IV iron once. Will need a CBC in 6 weeks.  Thrombocytosis: Likely acting as an acute phase reactant as well as probably a thyroid deficiency anemia    Protein-calorie malnutrition, severe   Pneumonia of right lung due to methicillin resistant  Staphylococcus aureus (MRSA) (HCC)    DVT Prophylaxis - Lovenox ordered.  Family Communication: none Disposition Plan: Home when stable. Code Status:     Code Status Orders        Start     Ordered   08/13/15 2051  Full code   Continuous     08/13/15 2050        IV Access:    Peripheral IV   Procedures and diagnostic studies:   No results found.   Medical Consultants:    None.  Anti-Infectives:   Anti-infectives    Start     Dose/Rate Route Frequency Ordered Stop   08/17/15 2300  vancomycin (VANCOCIN) IVPB 1000 mg/200 mL premix  Status:  Discontinued     1,000 mg 200 mL/hr over 60 Minutes Intravenous Every 24 hours 08/17/15 1037 08/17/15 1626   08/17/15 1800  linezolid (ZYVOX) tablet 600 mg     600 mg Oral Every 12 hours 08/17/15 1625 08/27/15 2159   08/17/15 1630  linezolid (ZYVOX) NICU  ORAL  syringe 100 mg/5 mL  Status:  Discontinued     600 mg Oral Every 12 hours 08/17/15 1618 08/17/15 1624   08/13/15 1700  cefTAZidime (FORTAZ) 2 g in dextrose 5 % 50 mL IVPB  Status:  Discontinued     2 g 100 mL/hr over 30 Minutes Intravenous Every 8 hours 08/13/15 1700 08/16/15 0836   08/13/15 1700  cefTAZidime (FORTAZ) 2 g in dextrose 5 % 50 mL IVPB  Status:  Discontinued     2 g 100 mL/hr over 30 Minutes Intravenous 3 times per day 08/13/15 1648 08/13/15 1700   08/13/15 1700  vancomycin (VANCOCIN) IVPB 1000  mg/200 mL premix  Status:  Discontinued     1,000 mg 200 mL/hr over 60 Minutes Intravenous Every 24 hours 08/13/15 1659 08/17/15 1037      Subjective:    Autumn Turner she relates she feels significantly tired.  Objective:    Filed Vitals:   08/17/15 1942 08/17/15 2121 08/18/15 0604 08/18/15 0933  BP: 115/61  121/77   Pulse: 87  89 94  Temp: 98.2 F (36.8 C)  98.4 F (36.9 C)   TempSrc: Oral  Oral   Resp: 18  18 16   Height:      Weight:   46.2 kg (101 lb 13.6 oz)   SpO2: 95% 96% 92% 92%    Intake/Output Summary (Last 24 hours) at 08/18/15  1120 Last data filed at 08/18/15 0600  Gross per 24 hour  Intake    420 ml  Output    800 ml  Net   -380 ml   Filed Weights   08/16/15 0539 08/17/15 0508 08/18/15 0604  Weight: 47.628 kg (105 lb) 47.038 kg (103 lb 11.2 oz) 46.2 kg (101 lb 13.6 oz)    Exam: Gen:  NAD Cardiovascular:  RRR. Chest and lungs:   Moderate air movement with crackles on the right. Abdomen:  Abdomen soft, NT/ND, + BS Extremities:  No C/E/C   Data Reviewed:    Labs: Basic Metabolic Panel:  Recent Labs Lab 08/13/15 2146 08/14/15 0614 08/15/15 0626 08/16/15 0640 08/17/15 0600 08/18/15 0339  NA  --  138 139 138 137 137  K  --  3.3* 3.1* 3.0* 3.3* 4.2  CL  --  104 104 98* 94* 93*  CO2  --  25 28 31  37* 38*  GLUCOSE  --  59* 86 76 85 85  BUN  --  9 5* <5* <5* <5*  CREATININE  --  0.49 0.49 0.48 0.42* 0.33*  CALCIUM  --  7.8* 8.1* 8.3* 8.4* 8.5*  MG 2.0  --  1.6* 2.1 1.9  --   PHOS 3.4  --   --   --   --   --    GFR Estimated Creatinine Clearance: 57.3 mL/min (by C-G formula based on Cr of 0.33). Liver Function Tests:  Recent Labs Lab 08/14/15 [id number] 08/14/15 1558 08/15/15 0626 08/16/15 0640  AST 14*  --  11* 15  ALT 10*  --  8* 9*  ALKPHOS 217*  --  176* 172*  BILITOT 0.3  --  0.5 0.7  PROT 4.5* 4.4* 4.0* 4.5*  ALBUMIN 1.3*  --  1.1* 1.1*   No results for input(s): LIPASE, AMYLASE in the last 168 hours. No results for input(s): AMMONIA in the last 168 hours. Coagulation profile No results for input(s): INR, PROTIME in the last 168 hours.  CBC:  Recent Labs Lab 08/13/15 1550 08/14/15 0614 08/15/15 0626 08/16/15 0640  WBC 16.4* 11.7* 9.7 8.8  NEUTROABS 15.2* 10.5* 9.0* 8.0*  HGB 8.2* 7.4* 6.9* 7.1*  HCT 27.2* 24.7* 22.0* 23.2*  MCV 80.2 80.2 79.7 82.0  PLT 596* 522* 467* 463*   Cardiac Enzymes: No results for input(s): CKTOTAL, CKMB, CKMBINDEX, TROPONINI in the last 168 hours. BNP (last 3 results) No results for input(s): PROBNP in the last 8760 hours. CBG:  Recent  Labs Lab 08/14/15 1106  GLUCAP 73   D-Dimer: No results for input(s): DDIMER in the last 72 hours. Hgb A1c: No results for input(s): HGBA1C in the last 72 hours. Lipid Profile: No results for input(s):  CHOL, HDL, LDLCALC, TRIG, CHOLHDL, LDLDIRECT in the last 72 hours. Thyroid function studies: No results for input(s): TSH, T4TOTAL, T3FREE, THYROIDAB in the last 72 hours.  Invalid input(s): FREET3 Anemia work up: No results for input(s): VITAMINB12, FOLATE, FERRITIN, TIBC, IRON, RETICCTPCT in the last 72 hours. Sepsis Labs:  Recent Labs Lab 08/13/15 1550 08/13/15 1606 08/14/15 0614 08/15/15 0626 08/16/15 0640  WBC 16.4*  --  11.7* 9.7 8.8  LATICACIDVEN  --  1.22  --   --   --    Microbiology Recent Results (from the past 240 hour(s))  Culture, blood (routine x 2) Call MD if unable to obtain prior to antibiotics being given     Status: None (Preliminary result)   Collection Time: 08/13/15  9:40 PM  Result Value Ref Range Status   Specimen Description BLOOD RIGHT ARM  Final   Special Requests BOTTLES DRAWN AEROBIC AND ANAEROBIC 10ML  Final   Culture NO GROWTH 3 DAYS  Final   Report Status PENDING  Incomplete  Culture, blood (routine x 2) Call MD if unable to obtain prior to antibiotics being given     Status: None (Preliminary result)   Collection Time: 08/13/15  9:46 PM  Result Value Ref Range Status   Specimen Description BLOOD RIGHT ARM  Final   Special Requests BOTTLES DRAWN AEROBIC AND ANAEROBIC 10ML  Final   Culture NO GROWTH 3 DAYS  Final   Report Status PENDING  Incomplete  Culture, respiratory (NON-Expectorated)     Status: None   Collection Time: 08/13/15 10:12 PM  Result Value Ref Range Status   Specimen Description EXPECTORATED SPUTUM  Final   Special Requests NONE  Final   Gram Stain   Final    FEW WBC PRESENT, PREDOMINANTLY PMN NO SQUAMOUS EPITHELIAL CELLS SEEN ABUNDANT GRAM POSITIVE COCCI IN PAIRS IN CLUSTERS FEW GRAM POSITIVE RODS Performed at  [facility]    Culture   Final    ABUNDANT METHICILLIN RESISTANT STAPHYLOCOCCUS AUREUS Note: RIFAMPIN AND GENTAMICIN SHOULD NOT BE USED AS SINGLE DRUGS FOR TREATMENT OF STAPH INFECTIONS. This organism DOES NOT demonstrate inducible Clindamycin resistance in vitro. CRITICAL RESULT CALLED TO, READ BACK BY AND VERIFIED WITH: HEATHER S 12/15  @0810  BY REAMM Performed at [facility]    Report Status 08/16/2015 FINAL  Final   Organism ID, Bacteria METHICILLIN RESISTANT STAPHYLOCOCCUS AUREUS  Final      Susceptibility   Methicillin resistant staphylococcus aureus - MIC*    CLINDAMYCIN <=0.25 SENSITIVE Sensitive     ERYTHROMYCIN >=8 RESISTANT Resistant     GENTAMICIN <=0.5 SENSITIVE Sensitive     LEVOFLOXACIN <=0.12 SENSITIVE Sensitive     OXACILLIN >=4 RESISTANT Resistant     RIFAMPIN <=0.5 SENSITIVE Sensitive     TRIMETH/SULFA <=10 SENSITIVE Sensitive     VANCOMYCIN 1 SENSITIVE Sensitive     TETRACYCLINE <=1 SENSITIVE Sensitive     * ABUNDANT METHICILLIN RESISTANT STAPHYLOCOCCUS AUREUS  MRSA PCR Screening     Status: Abnormal   Collection Time: 08/13/15 10:16 PM  Result Value Ref Range Status   MRSA by PCR POSITIVE (A) NEGATIVE Final    Comment:        The GeneXpert MRSA Assay (FDA approved for NASAL specimens only), is one component of a comprehensive MRSA colonization surveillance program. It is not intended to diagnose MRSA infection nor to guide or monitor treatment for MRSA infections. RESULT CALLED TO, READ BACK BY AND VERIFIED WITH: T PHILLIPS @0108  08/14/15 MKELLY  AFB culture with smear     Status: None (Preliminary result)   Collection Time: 08/14/15 12:25 PM  Result Value Ref Range Status   Specimen Description SPUTUM  Final   Special Requests Immunocompromised  Final   Acid Fast Smear   Final    NO ACID FAST BACILLI SEEN Performed at [facility]    Culture   Final    CULTURE WILL BE EXAMINED FOR 6 WEEKS BEFORE ISSUING A FINAL  REPORT Performed at [facility]    Report Status PENDING  Incomplete  Body fluid culture     Status: None   Collection Time: 08/14/15  1:45 PM  Result Value Ref Range Status   Specimen Description FLUID PLEURAL  Final   Special Requests Normal  Final   Gram Stain   Final    ABUNDANT WBC PRESENT,BOTH PMN AND MONONUCLEAR NO ORGANISMS SEEN    Culture NO GROWTH 3 DAYS  Final   Report Status 08/18/2015 FINAL  Final  Fungus Culture with Smear     Status: None (Preliminary result)   Collection Time: 08/14/15  1:45 PM  Result Value Ref Range Status   Specimen Description FLUID PLEURAL  Final   Special Requests NONE  Final   Fungal Smear   Final    NO YEAST OR FUNGAL ELEMENTS SEEN Performed at [facility]    Culture   Final    CULTURE IN PROGRESS FOR FOUR WEEKS Performed at [facility]    Report Status PENDING  Incomplete     Medications:   . buPROPion  300 mg Oral Daily  . Chlorhexidine Gluconate Cloth  6 each Topical Q0600  . cholecalciferol  1,000 Units Oral Daily  . feeding supplement (ENSURE ENLIVE)  237 mL Oral TID BM  . ipratropium-albuterol  3 mL Nebulization TID  . ketorolac  15 mg Intravenous Once  . linezolid  600 mg Oral Q12H  . multivitamin with minerals  1 tablet Oral Daily  . mupirocin ointment  1 application Nasal BID  . pantoprazole  40 mg Oral Daily  . protein supplement  2 oz Oral Q24H  . sodium chloride  3 mL Intravenous Q12H  . vitamin B-12  1,000 mcg Oral Daily  . vitamin C  500 mg Oral Daily   Continuous Infusions:   Time spent: 25 min   LOS: 5 days   Charlynne Cousins  [facility] Pager [phone]  *Please refer to [email or web address], password [email or web address] to get updated schedule on who will round on this patient, as hospitalists switch teams weekly. If 7PM-7AM, please contact night-coverage at [email or web address], password [email or web address] for any overnight needs.  08/18/2015, 11:20 AM

## 2015-08-19 DIAGNOSIS — A419 Sepsis, unspecified organism: Secondary | ICD-10-CM

## 2015-08-19 LAB — CULTURE, BLOOD (ROUTINE X 2)
CULTURE: NO GROWTH
CULTURE: NO GROWTH

## 2015-08-19 MED ORDER — HYDROCODONE-ACETAMINOPHEN 5-325 MG PO TABS
1.0000 | ORAL_TABLET | Freq: Every day | ORAL | Status: DC | PRN
  Administered 2015-08-19: 1 via ORAL
  Filled 2015-08-19: qty 1

## 2015-08-19 MED ORDER — FERROUS SULFATE 325 (65 FE) MG PO TABS
325.0000 mg | ORAL_TABLET | Freq: Three times a day (TID) | ORAL | Status: DC
  Administered 2015-08-19: 325 mg via ORAL
  Filled 2015-08-19 (×2): qty 1

## 2015-08-19 MED ORDER — FERROUS SULFATE 325 (65 FE) MG PO TABS
325.0000 mg | ORAL_TABLET | Freq: Three times a day (TID) | ORAL | Status: DC
  Administered 2015-08-20 (×2): 325 mg via ORAL
  Filled 2015-08-19 (×2): qty 1

## 2015-08-19 MED ORDER — HYDROCODONE-ACETAMINOPHEN 5-325 MG PO TABS
1.0000 | ORAL_TABLET | Freq: Every day | ORAL | Status: DC | PRN
  Administered 2015-08-19: 2 via ORAL
  Filled 2015-08-19 (×2): qty 2

## 2015-08-19 MED ORDER — BENZONATATE 100 MG PO CAPS
100.0000 mg | ORAL_CAPSULE | Freq: Once | ORAL | Status: AC
  Administered 2015-08-19: 100 mg via ORAL
  Filled 2015-08-19: qty 1

## 2015-08-19 MED ORDER — MORPHINE SULFATE (PF) 2 MG/ML IV SOLN
2.0000 mg | INTRAVENOUS | Status: DC | PRN
  Administered 2015-08-19 – 2015-08-20 (×5): 2 mg via INTRAVENOUS
  Filled 2015-08-19 (×5): qty 1

## 2015-08-19 MED ORDER — POLYETHYLENE GLYCOL 3350 17 G PO PACK
17.0000 g | PACK | Freq: Every day | ORAL | Status: DC
  Administered 2015-08-19: 17 g via ORAL
  Filled 2015-08-19: qty 1

## 2015-08-19 NOTE — Clinical Social Work Note (Signed)
Clinical Social Work Assessment  Patient Details  Name: Autumn Turner MRN: [id number] Date of Birth: 1961-01-02  Date of referral:  08/19/15               Reason for consult:  Discharge Planning, Facility Placement                Permission sought to share information with:  Facility Sport and exercise psychologist, Family Supports Permission granted to share information::  Yes, Verbal Permission Granted  Name::     Chiropodist::  SNFs  Relationship::     Contact Information:     Housing/Transportation Living arrangements for the past 2 months:  Single Family Home Source of Information:  Patient Patient Interpreter Needed:  None Criminal Activity/Legal Involvement Pertinent to Current Situation/Hospitalization:  No - Comment as needed Significant Relationships:  Spouse Lives with:  Spouse Do you feel safe going back to the place where you live?  Yes Need for family participation in patient care:  Yes (Comment)  Care giving concerns:  Patient and husband agree that continue rehab at discharge is needed.   Social Worker assessment / plan:  CSW met with patient and patient's husband at bedside to complete assessment. Patient states she is agreeable to SNF placement at discharge. CSW explained SNF search/placement process and answered the patient's questions. The patient lives with her husband but agrees that she needs to be stronger before returning home. CSW will follow up with bed offers when available.  Employment status:  Disabled (Comment on whether or not currently receiving Disability) Insurance information:  Medicare PT Recommendations:  [place] / Referral to community resources:  [place]  Patient/Family's Response to care:  Patient and family appear content with the care the patient has received, though they do seem to have concerns about communication issues with treatment team.   Patient/Family's Understanding of and Emotional  Response to Diagnosis, Current Treatment, and Prognosis:  Patient and patient's husband Autumn Turner appear to have good understanding of reason for admission and the patient's post DC needs.   Emotional Assessment Appearance:  Appears older than stated age Attitude/Demeanor/Rapport:  Other (Appropriate and welcoming of CSW.) Affect (typically observed):  Accepting, Calm, Appropriate, Pleasant Orientation:  Oriented to Self, Oriented to Place, Oriented to  Time, Oriented to Situation Alcohol / Substance use:  Tobacco Use Psych involvement (Current and /or in the community):  No (Comment)  Discharge Needs  Concerns to be addressed:  Discharge Planning Concerns Readmission within the last 30 days:  No Current discharge risk:  Physical Impairment, Chronically ill Barriers to Discharge:  Continued Medical Work up   Rigoberto Noel, LCSW 08/19/2015, 3:01 PM

## 2015-08-19 NOTE — NC FL2 (Signed)
[place] LEVEL OF CARE SCREENING TOOL     IDENTIFICATION  Patient Name: Autumn Turner Birthdate: 12-28-1960 Sex: female Admission Date (Current Location): 08/13/2015  [facility] and [place] Number: Herbalist and Address:  [facility], [place] [address], [place], [place] [address]      Provider Number: [id number]  Attending Physician Name and Address:  Charlynne Cousins, MD  Relative Name and Phone Number:  [name]    Current Level of Care: Hospital Recommended Level of Care: [place] Prior Approval Number:    Date Approved/Denied:   PASRR Number:    Discharge Plan: SNF    Current Diagnoses: Patient Active Problem List   Diagnosis Date Noted  . Protein-calorie malnutrition, severe 08/17/2015  . Pneumonia of right lung due to methicillin resistant Staphylococcus aureus (MRSA) ([place]) 08/17/2015  . Acute respiratory failure with hypoxia ([place]) 08/14/2015  . Necrotizing pneumonia ([place]) 08/14/2015  . Microcytic anemia 08/14/2015  . Sepsis ([place]) 08/14/2015  . Pleural effusion   . HCAP (healthcare-associated pneumonia) 08/13/2015  . Hypokalemia 08/13/2015  . Anemia 08/13/2015  . Abdominal pain 08/13/2015  . 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 Cellulitis of groin, right 12/16/2012  . Vulvar cancer ([place]) 11/18/2012    Orientation RESPIRATION BLADDER Height & Weight    Self, Time, Situation, Place  O2 (4L) Continent 5\' 4"  (162.6 cm) 101 lbs.  BEHAVIORAL SYMPTOMS/MOOD NEUROLOGICAL BOWEL NUTRITION STATUS   (NONE)  (NONE) Continent Diet (Soft Diet)  AMBULATORY STATUS COMMUNICATION OF NEEDS Skin   Extensive Assist Verbally Normal                       Personal Care Assistance Level of Assistance  Bathing, Dressing Bathing Assistance: Limited assistance   Dressing Assistance: Limited assistance     Functional Limitations Info  Sight, Hearing, Speech Sight Info: Adequate Hearing Info: Adequate Speech  Info: Adequate    SPECIAL CARE FACTORS FREQUENCY  PT (By licensed PT), OT (By licensed OT)     PT Frequency: 5X/week OT Frequency: 5X/week            Contractures Contractures Info: Not present    Additional Factors Info  Psychotropic, Allergies, Code Status, Isolation Precautions Code Status Info: Full Allergies Info: Ciprocinonide, Ciprofloxacin, Dust mite extract, Lohexol, Nsaids Psychotropic Info: Wellbutrin   Isolation Precautions Info: Contact for MRSA     Current Medications (08/19/2015):  This is the current hospital active medication list Current Facility-Administered Medications  Medication Dose Route Frequency Provider Last Rate Last Dose  . albuterol (PROVENTIL) (2.5 MG/3ML) 0.083% nebulizer solution 2.5 mg  2.5 mg Nebulization Q2H PRN Chesley Mires, MD      . buPROPion (WELLBUTRIN XL) 24 hr tablet 300 mg  300 mg Oral Daily Reubin Milan, MD   300 mg at 08/19/15 1005  . Chlorhexidine Gluconate Cloth 2 % PADS 6 each  6 each Topical Q0600 Orson Eva, MD   6 each at 08/19/15 0600  . cholecalciferol (VITAMIN D) tablet 1,000 Units  1,000 Units Oral Daily Reubin Milan, MD   1,000 Units at 08/19/15 1007  . clidinium-chlordiazePOXIDE (LIBRAX) 2.5-5 mg per capsule  1 capsule Oral TID PRN Debbe Odea, MD      . diphenhydrAMINE (BENADRYL) tablet 12.5 mg  12.5 mg Oral QID PRN Reubin Milan, MD      . feeding supplement (ENSURE ENLIVE) (ENSURE ENLIVE) liquid 237 mL  237 mL Oral TID BM Saima  Rizwan, MD   237 mL at 08/19/15 1305  . ferrous sulfate tablet 325 mg  325 mg Oral TID WC Charlynne Cousins, MD   325 mg at 08/19/15 1007  . guaiFENesin (MUCINEX) 12 hr tablet 1,200 mg  1,200 mg Oral BID PRN Debbe Odea, MD      . HYDROcodone-acetaminophen (NORCO/VICODIN) 5-325 MG per tablet 1 tablet  1 tablet Oral Daily PRN Charlynne Cousins, MD   1 tablet at 08/19/15 1033  . ipratropium-albuterol (DUONEB) 0.5-2.5 (3) MG/3ML nebulizer solution 3 mL  3 mL Nebulization TID  Debbe Odea, MD   3 mL at 08/19/15 1432  . linezolid (ZYVOX) tablet 600 mg  600 mg Oral Q12H Debbe Odea, MD   600 mg at 08/18/15 2233  . loratadine (CLARITIN) tablet 10 mg  10 mg Oral Daily PRN Reubin Milan, MD      . magic mouthwash  10 mL Oral TID PRN Debbe Odea, MD      . multivitamin with minerals tablet 1 tablet  1 tablet Oral Daily Lorenda Peck, RD   1 tablet at 08/19/15 1005  . mupirocin ointment (BACTROBAN) 2 % 1 application  1 application Nasal BID Orson Eva, MD   1 application at [id number] 1006  . ondansetron (ZOFRAN) tablet 4 mg  4 mg Oral Q6H PRN Reubin Milan, MD       Or  . ondansetron [facility]) injection 4 mg  4 mg Intravenous Q6H PRN Reubin Milan, MD   4 mg at 08/14/15 0349  . pantoprazole (PROTONIX) EC tablet 40 mg  40 mg Oral Daily Assunta Found Stone, RPH   40 mg at 08/19/15 1005  . polyethylene glycol (MIRALAX / GLYCOLAX) packet 17 g  17 g Oral Daily Charlynne Cousins, MD   17 g at 08/19/15 1003  . protein supplement (UNJURY CHICKEN SOUP) powder 2 oz  2 oz Oral Q24H Reanne J Barbato, RD   2 oz at 08/19/15 1304  . sodium chloride 0.9 % injection 3 mL  3 mL Intravenous Q12H Reubin Milan, MD   3 mL at 08/19/15 1008  . vitamin B-12 (CYANOCOBALAMIN) tablet 1,000 mcg  1,000 mcg Oral Daily Reubin Milan, MD   1,000 mcg at 08/19/15 1007  . vitamin C (ASCORBIC ACID) tablet 500 mg  500 mg Oral Daily Reubin Milan, MD   500 mg at 08/19/15 1006     Discharge Medications: Please see discharge summary for a list of discharge medications.  Relevant Imaging Results:  Relevant Lab Results:   Additional Information    Liz Beach MSW, [place], [place], [id number]

## 2015-08-19 NOTE — Progress Notes (Signed)
[facility] PROGRESS NOTE    Progress Note   Autumn Turner [id number] DOB: 07-May-1961 DOA: 08/13/2015 PCP: Autumn Haber, MD   Brief Narrative:   Autumn Turner is an 54 y.o. female past medical history of gastric bypass and peptic ulcer disease presented to the hospital with 1 week history of productive cough dyspnea fatigue and fever. A chest x-ray was done in the emergency room showed multifocal consolidation, white blood cell count of 16 and lack of 1.2, he was started empirically on antibiotics.  Assessment/Plan:   Sepsis (HCC)/HCAP (healthcare-associated pneumonia)/  Pleural effusion: Now on oral  Zyvox. Afebrile. HIV was negative. Has remained afebrile. PT rec SNF. Will need a repeated CT scan in 2 weeks. Medically stable for transfer.  Acute respiratory failure with hypoxia: Likely due to necrotizing pneumonia in the setting of underlying COPD. Discontinue pulse oxymetry.  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 He is tolerating his diet.  Hypokalemia/hypomagnesemia: Resolved.  Moderate protein caloric malnutrition: Continue ensure.  Microcytic anemia Due to iron deficiency was given IV iron once. Will need a CBC in 6 weeks. Cont oral iron, add miralax.  Thrombocytosis: Likely acting as an acute phase reactant as well as probably a thyroid deficiency anemia    Protein-calorie malnutrition, severe   Pneumonia of right lung due to methicillin resistant Staphylococcus aureus (MRSA) (HCC)    DVT Prophylaxis - Lovenox ordered.  Family Communication: none Disposition Plan: Home in am Code Status:     Code Status Orders        Start     Ordered   08/13/15 2051  Full code   Continuous     08/13/15 2050        IV Access:    Peripheral IV   Procedures and diagnostic studies:   No results found.   Medical Consultants:     None.  Anti-Infectives:   Anti-infectives    Start     Dose/Rate Route Frequency Ordered Stop   08/17/15 2300  vancomycin (VANCOCIN) IVPB 1000 mg/200 mL premix  Status:  Discontinued     1,000 mg 200 mL/hr over 60 Minutes Intravenous Every 24 hours 08/17/15 1037 08/17/15 1626   08/17/15 1800  linezolid (ZYVOX) tablet 600 mg     600 mg Oral Every 12 hours 08/17/15 1625 08/27/15 2159   08/17/15 1630  linezolid (ZYVOX) NICU  ORAL  syringe 100 mg/5 mL  Status:  Discontinued     600 mg Oral Every 12 hours 08/17/15 1618 08/17/15 1624   08/13/15 1700  cefTAZidime (FORTAZ) 2 g in dextrose 5 % 50 mL IVPB  Status:  Discontinued     2 g 100 mL/hr over 30 Minutes Intravenous Every 8 hours 08/13/15 1700 08/16/15 0836   08/13/15 1700  cefTAZidime (FORTAZ) 2 g in dextrose 5 % 50 mL IVPB  Status:  Discontinued     2 g 100 mL/hr over 30 Minutes Intravenous 3 times per day 08/13/15 1648 08/13/15 1700   08/13/15 1700  vancomycin (VANCOCIN) IVPB 1000 mg/200 mL premix  Status:  Discontinued     1,000 mg 200 mL/hr over 60 Minutes Intravenous Every 24 hours 08/13/15 1659 08/17/15 1037      Subjective:    Autumn Turner feels tired.  Objective:    Filed Vitals:   08/18/15 2055 08/19/15 0333 08/19/15 0405 08/19/15 0755  BP:  128/68 129/67   Pulse: 93  97 95   Temp:   98.6 F (37 C)   TempSrc:   Oral   Resp: 18  18   Height:      Weight:      SpO2: 97%  93% 96%    Intake/Output Summary (Last 24 hours) at 08/19/15 0936 Last data filed at 08/19/15 0618  Gross per 24 hour  Intake    900 ml  Output   1200 ml  Net   -300 ml   Filed Weights   08/16/15 0539 08/17/15 0508 08/18/15 0604  Weight: 47.628 kg (105 lb) 47.038 kg (103 lb 11.2 oz) 46.2 kg (101 lb 13.6 oz)    Exam: Gen:  NAD Cardiovascular:  RRR. Chest and lungs:   Moderate air movement with crackles on the right. Abdomen:  Abdomen soft, NT/ND, + BS Extremities:  No C/E/C   Data Reviewed:    Labs: Basic Metabolic  Panel:  Recent Labs Lab 08/13/15 2146 08/14/15 0614 08/15/15 0626 08/16/15 0640 08/17/15 0600 08/18/15 0339  NA  --  138 139 138 137 137  K  --  3.3* 3.1* 3.0* 3.3* 4.2  CL  --  104 104 98* 94* 93*  CO2  --  25 28 31  37* 38*  GLUCOSE  --  59* 86 76 85 85  BUN  --  9 5* <5* <5* <5*  CREATININE  --  0.49 0.49 0.48 0.42* 0.33*  CALCIUM  --  7.8* 8.1* 8.3* 8.4* 8.5*  MG 2.0  --  1.6* 2.1 1.9  --   PHOS 3.4  --   --   --   --   --    GFR Estimated Creatinine Clearance: 57.3 mL/min (by C-G formula based on Cr of 0.33). Liver Function Tests:  Recent Labs Lab 08/14/15 [id number] 08/14/15 1558 08/15/15 0626 08/16/15 0640  AST 14*  --  11* 15  ALT 10*  --  8* 9*  ALKPHOS 217*  --  176* 172*  BILITOT 0.3  --  0.5 0.7  PROT 4.5* 4.4* 4.0* 4.5*  ALBUMIN 1.3*  --  1.1* 1.1*   No results for input(s): LIPASE, AMYLASE in the last 168 hours. No results for input(s): AMMONIA in the last 168 hours. Coagulation profile No results for input(s): INR, PROTIME in the last 168 hours.  CBC:  Recent Labs Lab 08/13/15 1550 08/14/15 0614 08/15/15 0626 08/16/15 0640  WBC 16.4* 11.7* 9.7 8.8  NEUTROABS 15.2* 10.5* 9.0* 8.0*  HGB 8.2* 7.4* 6.9* 7.1*  HCT 27.2* 24.7* 22.0* 23.2*  MCV 80.2 80.2 79.7 82.0  PLT 596* 522* 467* 463*   Cardiac Enzymes: No results for input(s): CKTOTAL, CKMB, CKMBINDEX, TROPONINI in the last 168 hours. BNP (last 3 results) No results for input(s): PROBNP in the last 8760 hours. CBG:  Recent Labs Lab 08/14/15 1106  GLUCAP 73   D-Dimer: No results for input(s): DDIMER in the last 72 hours. Hgb A1c: No results for input(s): HGBA1C in the last 72 hours. Lipid Profile: No results for input(s): CHOL, HDL, LDLCALC, TRIG, CHOLHDL, LDLDIRECT in the last 72 hours. Thyroid function studies: No results for input(s): TSH, T4TOTAL, T3FREE, THYROIDAB in the last 72 hours.  Invalid input(s): FREET3 Anemia work up: No results for input(s): VITAMINB12, FOLATE,  FERRITIN, TIBC, IRON, RETICCTPCT in the last 72 hours. Sepsis Labs:  Recent Labs Lab 08/13/15 1550 08/13/15 1606 08/14/15 0614 08/15/15 0626 08/16/15 0640  WBC 16.4*  --  11.7* 9.7 8.8  LATICACIDVEN  --  1.22  --   --   --    Microbiology Recent Results (from the past 240 hour(s))  Culture, blood (routine x 2) Call MD if unable to obtain prior to antibiotics being given     Status: None (Preliminary result)   Collection Time: 08/13/15  9:40 PM  Result Value Ref Range Status   Specimen Description BLOOD RIGHT ARM  Final   Special Requests BOTTLES DRAWN AEROBIC AND ANAEROBIC 10ML  Final   Culture NO GROWTH 4 DAYS  Final   Report Status PENDING  Incomplete  Culture, blood (routine x 2) Call MD if unable to obtain prior to antibiotics being given     Status: None (Preliminary result)   Collection Time: 08/13/15  9:46 PM  Result Value Ref Range Status   Specimen Description BLOOD RIGHT ARM  Final   Special Requests BOTTLES DRAWN AEROBIC AND ANAEROBIC 10ML  Final   Culture NO GROWTH 4 DAYS  Final   Report Status PENDING  Incomplete  Culture, respiratory (NON-Expectorated)     Status: None   Collection Time: 08/13/15 10:12 PM  Result Value Ref Range Status   Specimen Description EXPECTORATED SPUTUM  Final   Special Requests NONE  Final   Gram Stain   Final    FEW WBC PRESENT, PREDOMINANTLY PMN NO SQUAMOUS EPITHELIAL CELLS SEEN ABUNDANT GRAM POSITIVE COCCI IN PAIRS IN CLUSTERS FEW GRAM POSITIVE RODS Performed at [facility]    Culture   Final    ABUNDANT METHICILLIN RESISTANT STAPHYLOCOCCUS AUREUS Note: RIFAMPIN AND GENTAMICIN SHOULD NOT BE USED AS SINGLE DRUGS FOR TREATMENT OF STAPH INFECTIONS. This organism DOES NOT demonstrate inducible Clindamycin resistance in vitro. CRITICAL RESULT CALLED TO, READ BACK BY AND VERIFIED WITH: HEATHER S 12/15  @0810  BY REAMM Performed at [facility]    Report Status 08/16/2015 FINAL  Final   Organism ID, Bacteria  METHICILLIN RESISTANT STAPHYLOCOCCUS AUREUS  Final      Susceptibility   Methicillin resistant staphylococcus aureus - MIC*    CLINDAMYCIN <=0.25 SENSITIVE Sensitive     ERYTHROMYCIN >=8 RESISTANT Resistant     GENTAMICIN <=0.5 SENSITIVE Sensitive     LEVOFLOXACIN <=0.12 SENSITIVE Sensitive     OXACILLIN >=4 RESISTANT Resistant     RIFAMPIN <=0.5 SENSITIVE Sensitive     TRIMETH/SULFA <=10 SENSITIVE Sensitive     VANCOMYCIN 1 SENSITIVE Sensitive     TETRACYCLINE <=1 SENSITIVE Sensitive     * ABUNDANT METHICILLIN RESISTANT STAPHYLOCOCCUS AUREUS  MRSA PCR Screening     Status: Abnormal   Collection Time: 08/13/15 10:16 PM  Result Value Ref Range Status   MRSA by PCR POSITIVE (A) NEGATIVE Final    Comment:        The GeneXpert MRSA Assay (FDA approved for NASAL specimens only), is one component of a comprehensive MRSA colonization surveillance program. It is not intended to diagnose MRSA infection nor to guide or monitor treatment for MRSA infections. RESULT CALLED TO, READ BACK BY AND VERIFIED WITH: T PHILLIPS @0108  08/14/15 MKELLY   AFB culture with smear     Status: None (Preliminary result)   Collection Time: 08/14/15 12:25 PM  Result Value Ref Range Status   Specimen Description SPUTUM  Final   Special Requests Immunocompromised  Final   Acid Fast Smear   Final    NO ACID FAST BACILLI SEEN Performed at [facility]    Culture   Final    CULTURE WILL BE EXAMINED FOR 6 WEEKS BEFORE  ISSUING A FINAL REPORT Performed at [facility]    Report Status PENDING  Incomplete  Body fluid culture     Status: None   Collection Time: 08/14/15  1:45 PM  Result Value Ref Range Status   Specimen Description FLUID PLEURAL  Final   Special Requests Normal  Final   Gram Stain   Final    ABUNDANT WBC PRESENT,BOTH PMN AND MONONUCLEAR NO ORGANISMS SEEN    Culture NO GROWTH 3 DAYS  Final   Report Status 08/18/2015 FINAL  Final  Fungus Culture with Smear     Status: None  (Preliminary result)   Collection Time: 08/14/15  1:45 PM  Result Value Ref Range Status   Specimen Description FLUID PLEURAL  Final   Special Requests NONE  Final   Fungal Smear   Final    NO YEAST OR FUNGAL ELEMENTS SEEN Performed at [facility]    Culture   Final    CULTURE IN PROGRESS FOR FOUR WEEKS Performed at [facility]    Report Status PENDING  Incomplete     Medications:   . buPROPion  300 mg Oral Daily  . Chlorhexidine Gluconate Cloth  6 each Topical Q0600  . cholecalciferol  1,000 Units Oral Daily  . feeding supplement (ENSURE ENLIVE)  237 mL Oral TID BM  . ipratropium-albuterol  3 mL Nebulization TID  . ketorolac  15 mg Intravenous Once  . linezolid  600 mg Oral Q12H  . multivitamin with minerals  1 tablet Oral Daily  . mupirocin ointment  1 application Nasal BID  . pantoprazole  40 mg Oral Daily  . protein supplement  2 oz Oral Q24H  . sodium chloride  3 mL Intravenous Q12H  . vitamin B-12  1,000 mcg Oral Daily  . vitamin C  500 mg Oral Daily   Continuous Infusions:   Time spent: 15 min   LOS: 6 days   Charlynne Cousins  [facility] Pager [phone]  *Please refer to [email or web address], password [email or web address] to get updated schedule on who will round on this patient, as hospitalists switch teams weekly. If 7PM-7AM, please contact night-coverage at [email or web address], password [email or web address] for any overnight needs.  08/19/2015, 9:36 AM

## 2015-08-20 DIAGNOSIS — L8990 Pressure ulcer of unspecified site, unspecified stage: Secondary | ICD-10-CM | POA: Insufficient documentation

## 2015-08-20 LAB — PH, BODY FLUID: PH, BODY FLUID: 8.1

## 2015-08-20 MED ORDER — FERROUS SULFATE 300 (60 FE) MG/5ML PO SYRP: 300.0000 mg | ORAL_SOLUTION | Freq: Every day | ORAL | Status: DC

## 2015-08-20 MED ORDER — UNJURY CHICKEN SOUP POWDER: 2.0000 [oz_av] | ORAL | Status: AC

## 2015-08-20 MED ORDER — LINEZOLID 600 MG PO TABS: 600.0000 mg | ORAL_TABLET | Freq: Two times a day (BID) | ORAL | Status: DC

## 2015-08-20 MED ORDER — OXYCODONE HCL 5 MG/5ML PO SOLN
5.0000 mg | ORAL | Status: DC | PRN
  Administered 2015-08-20 (×2): 5 mg via ORAL
  Filled 2015-08-20 (×2): qty 5

## 2015-08-20 MED ORDER — OXYCODONE HCL 5 MG/5ML PO SOLN: 5.0000 mg | ORAL | Status: DC | PRN

## 2015-08-20 NOTE — Discharge Summary (Signed)
Physician Discharge Summary  MIKALA GOCHNOUR [id number] DOB: [birth year] DOA: 08/13/2015  PCP: Robyn Haber, MD  Admit date: 08/13/2015 Discharge date: 08/20/2015  Time spent: 35 minutes  Recommendations for Outpatient Follow-up:  1. PCP in 2 weeks previous CT scan of the chest to rule out malignancy. 2. She will continue Zyvox for 2 additional days.   Discharge Diagnoses:  Principal Problem:   HCAP (healthcare-associated pneumonia) Active Problems:   Hypokalemia   Anemia   Abdominal pain   Acute respiratory failure with hypoxia (HCC)   Necrotizing pneumonia (HCC)   Microcytic anemia   Sepsis (HCC)   Pleural effusion   Protein-calorie malnutrition, severe   Pneumonia of right lung due to methicillin resistant Staphylococcus aureus (MRSA) ([place])   Discharge Condition: stable  Diet recommendation: regular  Filed Weights   08/17/15 0508 08/18/15 0604 08/20/15 0458  Weight: 47.038 kg (103 lb 11.2 oz) 46.2 kg (101 lb 13.6 oz) 47.174 kg (104 lb)    History of present illness:  54 year old with past medical history of gastric bypass the came into the emergency room for progressive fatigue, productive cough decrease appetite and fevers. She also relates pleuritic chest pain but denies hemoptysis palpitation or diaphoresis.  Hospital Course:  Sepsis due to healthcare associated pneumonia with pleural effusion: When she was admitted she was started on empiric antibiotics vancomycin and Zosyn, pulmonary was consulted to T multilobar infiltrates right greater than left. Thoracocentesis was done and fluid had no growth. Sputum culture grew MRSA, ID was consulted and recommended to change antibiotic coverage to Zyvox.  HIV, and urine antigens were negative.  Acute respiratory failure with hypoxia: Likely due to necrotizing MRSA pneumonia in the setting of underlying COPD. At home she is on 2 L of nasal cannula on admission she had to be placed on 4-6 L to keep saturations above  90%. On the day of discharge her saturations remain greater than 90% on 2 L.  Chronic abdominal pain: Your narcotics were changed to liquid.  Hypokalemia/hypomagnesemia: 0 repleted orally.  Severe protein caloric malnutrition: Continue ensure 3 times a day.  Microcytic anemia: She was given IV iron. She'll continue liquid iron form 3 times a day. She will need a CBC in 6 weeks.  Thrombocytosis: Acting as an acute phase reactant and probably due to iron deficiency anemia.  Procedures:  CT chest  Consultations:  ID  PCCM  Discharge Exam: Filed Vitals:   08/20/15 0458 08/20/15 0808  BP: 140/80 136/83  Pulse: 89 91  Temp: 98.1 F (36.7 C)   Resp: 20     General: A&O x3 Cardiovascular: RRR Respiratory: good air movement CTA B/L  Discharge Instructions   Discharge Instructions    Diet - low sodium heart healthy    Complete by:  As directed      Increase activity slowly    Complete by:  As directed           Current Discharge Medication List    START taking these medications   Details  ferrous sulfate 300 (60 FE) MG/5ML syrup Take 5 mLs (300 mg total) by mouth daily. Qty: 150 mL, Refills: 3    linezolid (ZYVOX) 600 MG tablet Take 1 tablet (600 mg total) by mouth every 12 (twelve) hours. Qty: 2 tablet, Refills: 0    oxyCODONE (ROXICODONE) 5 MG/5ML solution Take 5 mLs (5 mg total) by mouth every 4 (four) hours as needed for moderate pain. Qty: 30 mL, Refills: 0    protein supplement Renee Pain  CHICKEN SOUP) POWD Take 7 g (2 oz total) by mouth daily. Qty: 30 g, Refills: 0      CONTINUE these medications which have NOT CHANGED   Details  buPROPion (WELLBUTRIN XL) 150 MG 24 hr tablet Take 300 mg by mouth daily.    cholecalciferol (VITAMIN D) 1000 UNITS tablet Take 1,000 Units by mouth daily.    clidinium-chlordiazePOXIDE (LIBRAX) 5-2.5 MG capsule Take 1 capsule by mouth 4 (four) times daily -  before meals and at bedtime. Refills: 3    diphenhydrAMINE  (BENADRYL) 12.5 MG chewable tablet Chew 12.5 mg by mouth 4 (four) times daily as needed for allergies.    esomeprazole (NEXIUM) 20 MG capsule Take 20 mg by mouth daily.    loratadine (CLARITIN) 10 MG tablet Take 10 mg by mouth daily as needed for allergies or rhinitis.     Multiple Vitamins-Minerals (MULTIVITAMIN PO) Take 1 tablet by mouth daily.     Tetrahydrozoline HCl (VISINE OP) Place 1-2 drops into both eyes as needed (for dry eyes).    traMADol (ULTRAM) 50 MG tablet TAKE 1 TABLET BY MOUTH THREE TIMES DAILY AS NEEDED Qty: 60 tablet, Refills: 0   Associated Diagnoses: Epigastric pain    vitamin B-12 (CYANOCOBALAMIN) 1000 MCG tablet Take 1,000 mcg by mouth daily.    vitamin C (ASCORBIC ACID) 500 MG tablet Take 500 mg by mouth daily.      STOP taking these medications     HYDROcodone-acetaminophen (NORCO/VICODIN) 5-325 MG tablet        Allergies  Allergen Reactions  . Ciprocinonide [Fluocinolone] Hives and Rash  . Ciprofloxacin Other (See Comments)    Blisters, boils  . Dust Mite Extract Itching  . Iohexol Hives       . Nsaids Other (See Comments)    Due to Hx of gastric bypass   Follow-up Information    Follow up with Robyn Haber, MD.   Specialty:  Family Medicine   Why:  Repeat a CT scan in 2 weeks after discharge.   Contact information:   [place] [id number] [phone]        The results of significant diagnostics from this hospitalization (including imaging, microbiology, ancillary and laboratory) are listed below for reference.    Significant Diagnostic Studies: Dg Chest 2 View  08/16/2015  CLINICAL DATA:  Cough, shortness of breath, pneumonia. EXAM: CHEST  2 VIEW COMPARISON:  Portable chest x-ray of August 14, 2015 FINDINGS: On the right the confluent interstitial and alveolar opacities in the mid and lower lung are stable. There is a stable cavity in the right upper lobe. There are slightly increased interstitial densities in  the left mid and lower lung. There is a small left pleural effusion. The heart is not enlarged. There is stable right hilar prominence. A stent like device is visible in the left upper quadrant of the abdomen and its positioning has been described on the previous CT scan. IMPRESSION: 1. Stable airspace opacity on the right consistent with pneumonia. 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 Slight interval increase in interstitial densities in the left mid and lower lung may reflect progressive pneumonia here. 2. There is no pulmonary vascular congestion. There is a small left pleural effusion. Electronically Signed   By: David  Martinique M.D.   On: 08/16/2015 09:07   Dg Chest 2 View  08/13/2015  CLINICAL DATA:  54 year old female with productive cough and recent fevers EXAM: CHEST  2 VIEW COMPARISON:  Most recent prior chest x-ray 12/03/2012 ; abdominal radiograph including the lower lungs 07/31/2015 FINDINGS: Interval development of multifocal patchy ground-glass attenuation opacity with areas of increased confluence in the right upper, right middle and right lower lobes. More mild patchy airspace opacity is also present within the left lower lobe. There are small bilateral pleural effusions. A rounded lucency in the right lung apex raises concern for underlying cavitation or bulla formation. The parenchymal findings represent a dramatic change compared to the relatively recent abdominal radiographs dated 07/31/2015. Incompletely imaged stent again noted over the left upper abdomen. No acute osseous abnormality. IMPRESSION: 1. Interval development of multifocal airspace consolidation throughout the right lung, and to a lesser extent in the left lower lobe compared to 07/31/2015. Findings are concerning for multi lobar pneumonia, or possibly aspiration given the presence of the esophageal stent. 2. A rounded lucency overlying the right lung apex may represent a geographic region of  relatively spared lung, or possibly developing cavitation or bulla formation. Recommend attention on follow-up chest radiographs. If the abnormality persists, CT scan of the chest may become warranted. 3. Small bilateral pleural effusions. Electronically Signed   By: Jacqulynn Cadet M.D.   On: 08/13/2015 16:31   Ct Chest Wo Contrast  08/14/2015  CLINICAL DATA:  54 year old female with generalized fatigue and productive cough (clear sputum) presenting with shortness of breath. Abnormal chest x-ray concerning for multilobar pneumonia. EXAM: CT CHEST WITHOUT CONTRAST TECHNIQUE: Multidetector CT imaging of the chest was performed following the standard protocol without IV contrast. COMPARISON:  No priors. FINDINGS: Mediastinum/Lymph Nodes: Heart size is normal. Small amount of pericardial fluid and/or thickening, unlikely to be of any hemodynamic significance at this time. No associated pericardial calcification. There is atherosclerosis of the thoracic aorta, the great vessels of the mediastinum and the coronary arteries, including calcified atherosclerotic plaque in the left main, left anterior descending, left circumflex and right coronary arteries. Soft tissue fullness in the right hilar region likely reflects underlying lymphadenopathy (assessment is limited on today's noncontrast CT examination). Multiple borderline enlarged right paratracheal lymph nodes are also noted measuring up to 9 mm in short axis. Esophagus is unremarkable in appearance. No axillary lymphadenopathy. Lungs/Pleura: 4.5 x 4.8 cm cavity in the apex of the right upper lobe in the midst of an area of extensive airspace consolidation. Widespread airspace consolidation is noted throughout the right lung, with patchy areas of ground-glass attenuation also noted throughout the left lung, presumably indicative of early endobronchial spread of infection into the contralateral lung. Large right and small left pleural effusions lying dependently.  Upper Abdomen: Postoperative changes in the proximal stomach incompletely visualized, but likely related to prior gastric bypass procedure. There is a stent present in the proximal stomach. Musculoskeletal/Soft Tissues: There are no aggressive appearing lytic or blastic lesions noted in the visualized portions of the skeleton. IMPRESSION: 1. Severe multilobar pneumonia in the lungs bilaterally (right greater than left), with large area of necrosis in the apex of the right upper lobe, and bilateral parapneumonic pleural effusions (right greater than left). 2. Large stent incompletely visualized in the stomach. This is unusual in appearance, and is favored to represent a distally migrated esophageal stent. Clinical correlation is recommended. 3. Prominent soft tissue in the right hilar region likely reflects underlying adenopathy, presumably reactive. There also multiple borderline enlarged right paratracheal lymph nodes. 4. Atherosclerosis, including left main and 3 vessel coronary artery disease. Please note that although the presence of coronary artery calcium documents the presence of coronary  artery disease, the severity of this disease and any potential stenosis cannot be assessed on this non-gated CT examination. Assessment for potential risk factor modification, dietary therapy or pharmacologic therapy may be warranted, if clinically indicated. Electronically Signed   By: Vinnie Langton M.D.   On: 08/14/2015 07:24   Dg Chest Port 1 View  08/14/2015  CLINICAL DATA:  Status post right-sided thoracentesis EXAM: PORTABLE CHEST - 1 VIEW COMPARISON:  08/13/2015 FINDINGS: Cardiac shadow is stable. The lungs are well aerated bilaterally. Persistent infiltrate is noted throughout the mid right lung with cavitary area superiorly similar to that noted on prior examinations. No pneumothorax is noted following thoracentesis. Note is made of a previously placed esophageal stent which now lies within the stomach.  IMPRESSION: Persistent infiltrative change and cavitary changes in the right lung. No pneumothorax is noted following thoracentesis. Electronically Signed   By: Inez Catalina M.D.   On: 08/14/2015 14:56   Dg Abd 2 Views  07/31/2015  CLINICAL DATA:  Gastric outlet stenosis . EXAM: ABDOMEN - 2 VIEW COMPARISON:  CT 08/27/2009 . FINDINGS: Gastric stent is noted coursing vertically over the region of the stomach. Clinical correlation suggested. Prior gastric surgery. Surgical clips right upper quadrant. No bowel distention. No gastric distention. No free air. IMPRESSION: Gastric stent is noted coursing vertically over the region of the stomach. Prior gastric surgery. No gastric distention. Electronically Signed   By: Marcello Moores  Register   On: 07/31/2015 11:32    Microbiology: Recent Results (from the past 240 hour(s))  Culture, blood (routine x 2) Call MD if unable to obtain prior to antibiotics being given     Status: None   Collection Time: 08/13/15  9:40 PM  Result Value Ref Range Status   Specimen Description BLOOD RIGHT ARM  Final   Special Requests BOTTLES DRAWN AEROBIC AND ANAEROBIC 10ML  Final   Culture NO GROWTH 5 DAYS  Final   Report Status 08/19/2015 FINAL  Final  Culture, blood (routine x 2) Call MD if unable to obtain prior to antibiotics being given     Status: None   Collection Time: 08/13/15  9:46 PM  Result Value Ref Range Status   Specimen Description BLOOD RIGHT ARM  Final   Special Requests BOTTLES DRAWN AEROBIC AND ANAEROBIC 10ML  Final   Culture NO GROWTH 5 DAYS  Final   Report Status 08/19/2015 FINAL  Final  Culture, respiratory (NON-Expectorated)     Status: None   Collection Time: 08/13/15 10:12 PM  Result Value Ref Range Status   Specimen Description EXPECTORATED SPUTUM  Final   Special Requests NONE  Final   Gram Stain   Final    FEW WBC PRESENT, PREDOMINANTLY PMN NO SQUAMOUS EPITHELIAL CELLS SEEN ABUNDANT GRAM POSITIVE COCCI IN PAIRS IN CLUSTERS FEW GRAM POSITIVE  RODS Performed at [facility]    Culture   Final    ABUNDANT METHICILLIN RESISTANT STAPHYLOCOCCUS AUREUS Note: RIFAMPIN AND GENTAMICIN SHOULD NOT BE USED AS SINGLE DRUGS FOR TREATMENT OF STAPH INFECTIONS. This organism DOES NOT demonstrate inducible Clindamycin resistance in vitro. CRITICAL RESULT CALLED TO, READ BACK BY AND VERIFIED WITH: HEATHER S 12/15  @0810  BY REAMM Performed at [facility]    Report Status 08/16/2015 FINAL  Final   Organism ID, Bacteria METHICILLIN RESISTANT STAPHYLOCOCCUS AUREUS  Final      Susceptibility   Methicillin resistant staphylococcus aureus - MIC*    CLINDAMYCIN <=0.25 SENSITIVE Sensitive     ERYTHROMYCIN >=8 RESISTANT Resistant  GENTAMICIN <=0.5 SENSITIVE Sensitive     LEVOFLOXACIN <=0.12 SENSITIVE Sensitive     OXACILLIN >=4 RESISTANT Resistant     RIFAMPIN <=0.5 SENSITIVE Sensitive     TRIMETH/SULFA <=10 SENSITIVE Sensitive     VANCOMYCIN 1 SENSITIVE Sensitive     TETRACYCLINE <=1 SENSITIVE Sensitive     * ABUNDANT METHICILLIN RESISTANT STAPHYLOCOCCUS AUREUS  MRSA PCR Screening     Status: Abnormal   Collection Time: 08/13/15 10:16 PM  Result Value Ref Range Status   MRSA by PCR POSITIVE (A) NEGATIVE Final    Comment:        The GeneXpert MRSA Assay (FDA approved for NASAL specimens only), is one component of a comprehensive MRSA colonization surveillance program. It is not intended to diagnose MRSA infection nor to guide or monitor treatment for MRSA infections. RESULT CALLED TO, READ BACK BY AND VERIFIED WITH: T PHILLIPS @0108  08/14/15 MKELLY   AFB culture with smear     Status: None (Preliminary result)   Collection Time: 08/14/15 12:25 PM  Result Value Ref Range Status   Specimen Description SPUTUM  Final   Special Requests Immunocompromised  Final   Acid Fast Smear   Final    NO ACID FAST BACILLI SEEN Performed at [facility]    Culture   Final    CULTURE WILL BE EXAMINED FOR 6 WEEKS BEFORE  ISSUING A FINAL REPORT Performed at [facility]    Report Status PENDING  Incomplete  Body fluid culture     Status: None   Collection Time: 08/14/15  1:45 PM  Result Value Ref Range Status   Specimen Description FLUID PLEURAL  Final   Special Requests Normal  Final   Gram Stain   Final    ABUNDANT WBC PRESENT,BOTH PMN AND MONONUCLEAR NO ORGANISMS SEEN    Culture NO GROWTH 3 DAYS  Final   Report Status 08/18/2015 FINAL  Final  Fungus Culture with Smear     Status: None (Preliminary result)   Collection Time: 08/14/15  1:45 PM  Result Value Ref Range Status   Specimen Description FLUID PLEURAL  Final   Special Requests NONE  Final   Fungal Smear   Final    NO YEAST OR FUNGAL ELEMENTS SEEN Performed at [facility]    Culture   Final    CULTURE IN PROGRESS FOR FOUR WEEKS Performed at [facility]    Report Status PENDING  Incomplete     Labs: Basic Metabolic Panel:  Recent Labs Lab 08/13/15 2146 08/14/15 0614 08/15/15 0626 08/16/15 0640 08/17/15 0600 08/18/15 0339  NA  --  138 139 138 137 137  K  --  3.3* 3.1* 3.0* 3.3* 4.2  CL  --  104 104 98* 94* 93*  CO2  --  25 28 31  37* 38*  GLUCOSE  --  59* 86 76 85 85  BUN  --  9 5* <5* <5* <5*  CREATININE  --  0.49 0.49 0.48 0.42* 0.33*  CALCIUM  --  7.8* 8.1* 8.3* 8.4* 8.5*  MG 2.0  --  1.6* 2.1 1.9  --   PHOS 3.4  --   --   --   --   --    Liver Function Tests:  Recent Labs Lab 08/14/15 0614 08/14/15 1558 08/15/15 0626 08/16/15 0640  AST 14*  --  11* 15  ALT 10*  --  8* 9*  ALKPHOS 217*  --  176* 172*  BILITOT 0.3  --  0.5 0.7  PROT 4.5* 4.4* 4.0* 4.5*  ALBUMIN 1.3*  --  1.1* 1.1*   No results for input(s): LIPASE, AMYLASE in the last 168 hours. No results for input(s): AMMONIA in the last 168 hours. CBC:  Recent Labs Lab 08/13/15 1550 08/14/15 0614 08/15/15 0626 08/16/15 0640  WBC 16.4* 11.7* 9.7 8.8  NEUTROABS 15.2* 10.5* 9.0* 8.0*  HGB 8.2* 7.4* 6.9* 7.1*  HCT 27.2*  24.7* 22.0* 23.2*  MCV 80.2 80.2 79.7 82.0  PLT 596* 522* 467* 463*   Cardiac Enzymes: No results for input(s): CKTOTAL, CKMB, CKMBINDEX, TROPONINI in the last 168 hours. BNP: BNP (last 3 results) No results for input(s): BNP in the last 8760 hours.  ProBNP (last 3 results) No results for input(s): PROBNP in the last 8760 hours.  CBG:  Recent Labs Lab 08/14/15 1106  GLUCAP 73       Signed:  FELIZ ORTIZ, ABRAHAM  [facility] 08/20/2015, 10:02 AM

## 2015-08-20 NOTE — Clinical Social Work Placement (Signed)
   CLINICAL SOCIAL WORK PLACEMENT  NOTE  Date:  08/20/2015  Patient Details  Name: Autumn Turner MRN: [id number] Date of Birth: 12-Jul-1961  Clinical Social Work is seeking post-discharge placement for this patient at the [place] level of care (*CSW will initial, date and re-position this form in  chart as items are completed):  Yes   Patient/family provided with [facility] Work Department's list of facilities offering this level of care within the geographic area requested by the patient (or if unable, by the patient's family).  Yes   Patient/family informed of their freedom to choose among providers that offer the needed level of care, that participate in Medicare, Medicaid or managed care program needed by the patient, have an available bed and are willing to accept the patient.  Yes   Patient/family informed of [facility]'s ownership interest in [facility] and [facility], as well as of the fact that they are under no obligation to receive care at these facilities.  PASRR submitted to EDS on 08/19/15     PASRR number received on 08/20/15     Existing PASRR number confirmed on       FL2 transmitted to all facilities in geographic area requested by pt/family on 08/20/15     FL2 transmitted to all facilities within larger geographic area on       Patient informed that his/her managed care company has contracts with or will negotiate with certain facilities, including the following:        Yes   Patient/family informed of bed offers received.  Patient chooses bed at   [facility]    Physician recommends and patient chooses bed at  Patient to be transferred to [facility] on 08/20/15.  Patient to be transferred to facility by Ambulance [facility])     Patient family notified on 08/20/15 of transfer.  Name of family member notified:  Dondra Prader- Husband     PHYSICIAN Please prepare priority discharge summary, including  medications, Please sign FL2, Please prepare prescriptions     Additional Comment: Ok per MD for d/c today to SNF for short term rehab. Patient is agreeable to d/c plan as is her husband.  Nursing notified to call report; DC summary sent to facility and EMS transport arranged.  No further CSW needs identified. CSW signing off.    _______________________________________________ Williemae Area, LCSW 08/20/2015, 4:46 PM  [phone]

## 2015-08-20 NOTE — Progress Notes (Signed)
Pt is refusing to take her vitamins and Miralax at this time. Pt states "all these medications are taking spaces in my stomach and I cannot eat." Pt also said she has not had BM since her admission but thinks is related to her poor oral intake; denies constipation.

## 2015-08-21 ENCOUNTER — Encounter: Payer: Self-pay | Admitting: Internal Medicine

## 2015-08-21 ENCOUNTER — Non-Acute Institutional Stay (SKILLED_NURSING_FACILITY): Payer: Medicare Other | Admitting: Internal Medicine

## 2015-08-21 DIAGNOSIS — R1084 Generalized abdominal pain: Secondary | ICD-10-CM | POA: Diagnosis not present

## 2015-08-21 DIAGNOSIS — J15212 Pneumonia due to Methicillin resistant Staphylococcus aureus: Secondary | ICD-10-CM

## 2015-08-21 DIAGNOSIS — J850 Gangrene and necrosis of lung: Secondary | ICD-10-CM

## 2015-08-21 DIAGNOSIS — F32A Depression, unspecified: Secondary | ICD-10-CM | POA: Insufficient documentation

## 2015-08-21 DIAGNOSIS — E559 Vitamin D deficiency, unspecified: Secondary | ICD-10-CM | POA: Diagnosis not present

## 2015-08-21 DIAGNOSIS — D509 Iron deficiency anemia, unspecified: Secondary | ICD-10-CM | POA: Diagnosis not present

## 2015-08-21 DIAGNOSIS — J9601 Acute respiratory failure with hypoxia: Secondary | ICD-10-CM

## 2015-08-21 DIAGNOSIS — K219 Gastro-esophageal reflux disease without esophagitis: Secondary | ICD-10-CM | POA: Diagnosis not present

## 2015-08-21 DIAGNOSIS — A4102 Sepsis due to Methicillin resistant Staphylococcus aureus: Secondary | ICD-10-CM

## 2015-08-21 DIAGNOSIS — E43 Unspecified severe protein-calorie malnutrition: Secondary | ICD-10-CM | POA: Diagnosis not present

## 2015-08-21 DIAGNOSIS — F329 Major depressive disorder, single episode, unspecified: Secondary | ICD-10-CM | POA: Insufficient documentation

## 2015-08-21 NOTE — Assessment & Plan Note (Signed)
SNF - cont ensure TID

## 2015-08-21 NOTE — Assessment & Plan Note (Signed)
SNF - cnt Vit D 1000 u daily

## 2015-08-21 NOTE — Assessment & Plan Note (Signed)
SNF - reported as chronic;oxycodone 5 mg q4?prn;plan - do not escalate pain medicaitons, will try to de-escalate

## 2015-08-21 NOTE — Progress Notes (Signed)
MRN: [id number] Name: TOMIA SPINNEY  Sex: female Age: 54 y.o. DOB: 1960/10/11  [place] #: [facility] Facility/Room:507 Level Of Care: SNF Provider: Inocencio Homes D Emergency Contacts: Extended Emergency Contact Information Primary Emergency Contact: Cronk,Scott Address: [address]          [place], [place] [address] [place] Mobile Phone: [phone] Relation: Spouse  Code Status:   Allergies: Ciprocinonide; Ciprofloxacin; Dust mite extract; Iohexol; and Nsaids  Chief Complaint  Patient presents with  . New Admit To SNF    HPI: Patient is 54 y.o. female with past medical history of gastric bypass who was admitted to [facility] from 12/12-19 for acute respiratory failure 2/2 necrotizing MRSA PNA. Course complicated with sepsis. Pt is admitted to SNF for generalized weakness for OT/PT. While at SNF pt will be followed for depression, tx with wellbutrin, Vit D def, tx with supplement and GERD, tx with nexium.  Past Medical History  Diagnosis Date  . Abnormal Pap smear   . Vulvar lesion   . CIN II (cervical intraepithelial neoplasia II)   . Malnourished ([place])   . Sleep apnea     Loss weight with gastric bypass  . Cancer ([place])     vulvar  . Anemia     blood transfusion  . Hypertension     no longer on medication  . Anxiety   . Depression   . History of stomach ulcers     Past Surgical History  Procedure Laterality Date  . Cesarean section    . Gastric bypass  2001  . Gallbladder surgery  2002  . Tummy tuck  2004  . Leep  06/2009  . Cholecystectomy    . Tonsillectomy      age 16 adnoids  . Vulvectomy Bilateral 12/07/2012    Procedure: VULVECTOMY AND BILATERAL INGUINAL LYMPH NODE DISSECTION, PAP SMEAR;  Surgeon: Janie Morning, MD;  Location: [facility] ORS;  Service: Gynecology;  Laterality: Bilateral;  . Revision to gastric bypass    . Esophagogastroduodenoscopy N/A 06/29/2015    Procedure: ESOPHAGOGASTRODUODENOSCOPY (EGD);  Surgeon: Clarene Essex, MD;  Location[facility]  ENDOSCOPY;  Service: Endoscopy;  Laterality: N/A;  . Balloon dilation N/A 06/29/2015    Procedure: BALLOON DILATION;  Surgeon: Clarene Essex, MD;  Location: [facility] ENDOSCOPY;  Service: Endoscopy;  Laterality: N/A;  . Esophagogastroduodenoscopy (egd) with propofol N/A 07/17/2015    Procedure: ESOPHAGOGASTRODUODENOSCOPY (EGD) WITH PROPOFOL;  Surgeon: Clarene Essex, MD;  Location: [facility] ENDOSCOPY;  Service: Endoscopy;  Laterality: N/A;  . Balloon dilation N/A 07/17/2015    Procedure: BALLOON DILATION;  Surgeon: Clarene Essex, MD;  Location: [facility] ENDOSCOPY;  Service: Endoscopy;  Laterality: N/A;  . Esophageal stent placement N/A 07/17/2015    Procedure: ESOPHAGEAL STENT PLACEMENT;  Surgeon: Clarene Essex, MD;  Location: [facility] ENDOSCOPY;  Service: Endoscopy;  Laterality: N/A;      Medication List       This list is accurate as of: 08/21/15 11:59 PM.  Always use your most recent med list.               buPROPion 150 MG 24 hr tablet  Commonly known as:  WELLBUTRIN XL  Take 300 mg by mouth daily.     cholecalciferol 1000 UNITS tablet  Commonly known as:  VITAMIN D  Take 1,000 Units by mouth daily.     clidinium-chlordiazePOXIDE 5-2.5 MG capsule  Commonly known as:  LIBRAX  Take 1 capsule by mouth 4 (four) times daily -  before meals and at  bedtime.     diphenhydrAMINE 12.5 MG chewable tablet  Commonly known as:  BENADRYL  Chew 12.5 mg by mouth 4 (four) times daily as needed for allergies.     esomeprazole 20 MG capsule  Commonly known as:  NEXIUM  Take 20 mg by mouth daily.     ferrous sulfate 300 (60 FE) MG/5ML syrup  Take 5 mLs (300 mg total) by mouth daily.     linezolid 600 MG tablet  Commonly known as:  ZYVOX  Take 1 tablet (600 mg total) by mouth every 12 (twelve) hours.     loratadine 10 MG tablet  Commonly known as:  CLARITIN  Take 10 mg by mouth daily as needed for allergies or rhinitis.     MULTIVITAMIN PO  Take 1 tablet by mouth daily.     oxyCODONE 5 MG/5ML solution  Commonly  known as:  ROXICODONE  Take 5 mLs (5 mg total) by mouth every 4 (four) hours as needed for moderate pain.     protein supplement Powd  Commonly known as:  UNJURY CHICKEN SOUP  Take 7 g (2 oz total) by mouth daily.     traMADol 50 MG tablet  Commonly known as:  ULTRAM  TAKE 1 TABLET BY MOUTH THREE TIMES DAILY AS NEEDED     VISINE OP  Place 1-2 drops into both eyes as needed (for dry eyes).     vitamin B-12 1000 MCG tablet  Commonly known as:  CYANOCOBALAMIN  Take 1,000 mcg by mouth daily.     vitamin C 500 MG tablet  Commonly known as:  ASCORBIC ACID  Take 500 mg by mouth daily.        No orders of the defined types were placed in this encounter.     There is no immunization history on file for this patient.  Social History  Substance Use Topics  . Smoking status: Current Some Day Smoker -- 0.50 packs/day for 20 years    Types: Cigarettes  . Smokeless tobacco: Never Used  . Alcohol Use: No    Family history is + CA, HTN    Review of Systems  DATA OBTAINED: from patient, nurse GENERAL:  no fevers, + fatigue, says can't eat 2/2 chronic bypass problems SKIN: No itching, rash or wounds EYES: No eye pain, redness, discharge EARS: No earache, tinnitus, change in hearing NOSE: No congestion, drainage or bleeding  MOUTH/THROAT: No mouth or tooth pain, No sore throat RESPIRATORY: No cough, wheezing, SOB CARDIAC: No chest pain, palpitations, lower extremity edema  GI: No abdominal pain, No N/V/D or constipation, No heartburn or reflux  GU: No dysuria, frequency or urgency, or incontinence  MUSCULOSKELETAL: No unrelieved bone/joint pain NEUROLOGIC: No headache, dizziness or focal weakness PSYCHIATRIC: No c/o anxiety or sadness   Filed Vitals:   08/21/15 1203  BP: 135/84  Pulse: 88  Temp: 97.4 F (36.3 C)  Resp: 18    SpO2 Readings from Last 1 Encounters:  08/24/15 96%        Physical Exam  GENERAL APPEARANCE: Alert, conversant,  No acute distress.   SKIN: No diaphoresis rash HEAD: Normocephalic, atraumatic  EYES: Conjunctiva/lids clear. Pupils round, reactive. EOMs intact.  EARS: External exam WNL, canals clear. Hearing grossly normal.  NOSE: No deformity or discharge.  MOUTH/THROAT: Lips w/o lesions  RESPIRATORY: Breathing is even, unlabored. Lung sounds are clear   CARDIOVASCULAR: Heart RRR no murmurs, rubs or gallops. No peripheral edema.   GASTROINTESTINAL: Abdomen is soft, non-tender, not  distended w/ normal bowel sounds. GENITOURINARY: Bladder non tender, not distended  MUSCULOSKELETAL: No abnormal joints or musculature NEUROLOGIC:  Cranial nerves 2-12 grossly intact. Moves all extremities  PSYCHIATRIC: flat affect, odd affect, no behavioral issues  Patient Active Problem List   Diagnosis Date Noted  . 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 Self induced vomiting 08/24/2015  . Narcotic drug use 08/24/2015  . Edema 08/23/2015  . Non compliance with medical treatment 08/22/2015  . Cognitive impairment 08/22/2015  . Depression 08/21/2015  . GERD (gastroesophageal reflux disease) 08/21/2015  . Vitamin D deficiency 08/21/2015  . Pressure ulcer 08/20/2015  . Protein-calorie malnutrition, severe 08/17/2015  . Pneumonia of right lung due to methicillin resistant Staphylococcus aureus (MRSA) ([place]) 08/17/2015  . Acute respiratory failure with hypoxia ([place]) 08/14/2015  . Necrotizing pneumonia ([place]) 08/14/2015  . Microcytic anemia 08/14/2015  . Sepsis ([place]) 08/14/2015  . Pleural effusion   . HCAP (healthcare-associated pneumonia) 08/13/2015  . Hypokalemia 08/13/2015  . Anemia 08/13/2015  . Abdominal pain 08/13/2015  . 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 Cellulitis of groin, right 12/16/2012  . Vulvar cancer ([place]) 11/18/2012    CBC    Component Value Date/Time   WBC 8.8 08/16/2015 0640   WBC 4.7 01/07/2015 1417   RBC 2.83* 08/16/2015 0640   RBC 3.59* 01/07/2015 1417   HGB 7.1* 08/16/2015 0640   HGB 9.0* 01/07/2015  1417   HCT 23.2* 08/16/2015 0640   HCT 29.6* 01/07/2015 1417   PLT 463* 08/16/2015 0640   MCV 82.0 08/16/2015 0640   MCV 82.4 01/07/2015 1417   LYMPHSABS 0.3* 08/16/2015 0640   MONOABS 0.4 08/16/2015 0640   EOSABS 0.1 08/16/2015 0640   BASOSABS 0.0 08/16/2015 0640    CMP     Component Value Date/Time   NA 137 08/18/2015 0339   K 4.2 08/18/2015 0339   CL 93* 08/18/2015 0339   CO2 38* 08/18/2015 0339   GLUCOSE 85 08/18/2015 0339   BUN <5* 08/18/2015 0339   CREATININE 0.33* 08/18/2015 0339   CREATININE 0.55 01/07/2015 1406   CALCIUM 8.5* 08/18/2015 0339   PROT 4.5* 08/16/2015 0640   ALBUMIN 1.1* 08/16/2015 0640   AST 15 08/16/2015 0640   ALT 9* 08/16/2015 0640   ALKPHOS 172* 08/16/2015 0640   BILITOT 0.7 08/16/2015 0640   GFRNONAA >60 08/18/2015 0339   GFRAA >60 08/18/2015 0339    No results found for: HGBA1C   Dg Chest 2 View  08/13/2015  CLINICAL DATA:  54 year old female with productive cough and recent fevers EXAM: CHEST  2 VIEW COMPARISON:  Most recent prior chest x-ray 12/03/2012 ; abdominal radiograph including the lower lungs 07/31/2015 FINDINGS: Interval development of multifocal patchy ground-glass attenuation opacity with areas of increased confluence in the right upper, right middle and right lower lobes. More mild patchy airspace opacity is also present within the left lower lobe. There are small bilateral pleural effusions. A rounded lucency in the right lung apex raises concern for underlying cavitation or bulla formation. The parenchymal findings represent a dramatic change compared to the relatively recent abdominal radiographs dated 07/31/2015. Incompletely imaged stent again noted over the left upper abdomen. No acute osseous abnormality. IMPRESSION: 1. Interval development of multifocal airspace consolidation throughout the right lung, and to a lesser extent in the left lower lobe compared to 07/31/2015. Findings are concerning for multi lobar pneumonia, or  possibly aspiration given the presence of the esophageal stent. 2. A rounded lucency overlying the right lung apex may represent a geographic  region of relatively spared lung, or possibly developing cavitation or bulla formation. Recommend attention on follow-up chest radiographs. If the abnormality persists, CT scan of the chest may become warranted. 3. Small bilateral pleural effusions. Electronically Signed   By: Jacqulynn Cadet M.D.   On: 08/13/2015 16:31   Ct Chest Wo Contrast  08/14/2015  CLINICAL DATA:  54 year old female with generalized fatigue and productive cough (clear sputum) presenting with shortness of breath. Abnormal chest x-ray concerning for multilobar pneumonia. EXAM: CT CHEST WITHOUT CONTRAST TECHNIQUE: Multidetector CT imaging of the chest was performed following the standard protocol without IV contrast. COMPARISON:  No priors. FINDINGS: Mediastinum/Lymph Nodes: Heart size is normal. Small amount of pericardial fluid and/or thickening, unlikely to be of any hemodynamic significance at this time. No associated pericardial calcification. There is atherosclerosis of the thoracic aorta, the great vessels of the mediastinum and the coronary arteries, including calcified atherosclerotic plaque in the left main, left anterior descending, left circumflex and right coronary arteries. Soft tissue fullness in the right hilar region likely reflects underlying lymphadenopathy (assessment is limited on today's noncontrast CT examination). Multiple borderline enlarged right paratracheal lymph nodes are also noted measuring up to 9 mm in short axis. Esophagus is unremarkable in appearance. No axillary lymphadenopathy. Lungs/Pleura: 4.5 x 4.8 cm cavity in the apex of the right upper lobe in the midst of an area of extensive airspace consolidation. Widespread airspace consolidation is noted throughout the right lung, with patchy areas of ground-glass attenuation also noted throughout the left lung,  presumably indicative of early endobronchial spread of infection into the contralateral lung. Large right and small left pleural effusions lying dependently. Upper Abdomen: Postoperative changes in the proximal stomach incompletely visualized, but likely related to prior gastric bypass procedure. There is a stent present in the proximal stomach. Musculoskeletal/Soft Tissues: There are no aggressive appearing lytic or blastic lesions noted in the visualized portions of the skeleton. IMPRESSION: 1. Severe multilobar pneumonia in the lungs bilaterally (right greater than left), with large area of necrosis in the apex of the right upper lobe, and bilateral parapneumonic pleural effusions (right greater than left). 2. Large stent incompletely visualized in the stomach. This is unusual in appearance, and is favored to represent a distally migrated esophageal stent. Clinical correlation is recommended. 3. Prominent soft tissue in the right hilar region likely reflects underlying adenopathy, presumably reactive. There also multiple borderline enlarged right paratracheal lymph nodes. 4. Atherosclerosis, including left main and 3 vessel coronary artery disease. Please note that although the presence of coronary artery calcium documents the presence of coronary artery disease, the severity of this disease and any potential stenosis cannot be assessed on this non-gated CT examination. Assessment for potential risk factor modification, dietary therapy or pharmacologic therapy may be warranted, if clinically indicated. Electronically Signed   By: Vinnie Langton M.D.   On: 08/14/2015 07:24   Dg Chest Port 1 View  08/14/2015  CLINICAL DATA:  Status post right-sided thoracentesis EXAM: PORTABLE CHEST - 1 VIEW COMPARISON:  08/13/2015 FINDINGS: Cardiac shadow is stable. The lungs are well aerated bilaterally. Persistent infiltrate is noted throughout the mid right lung with cavitary area superiorly similar to that noted on prior  examinations. No pneumothorax is noted following thoracentesis. Note is made of a previously placed esophageal stent which now lies within the stomach. IMPRESSION: Persistent infiltrative change and cavitary changes in the right lung. No pneumothorax is noted following thoracentesis. Electronically Signed   By: Inez Catalina M.D.   On: 08/14/2015  14:56    Not all labs, radiology exams or other studies done during hospitalization come through on my EPIC note; however they are reviewed by me.    Assessment and Plan  Sepsis ([place]) due to healthcare associated pneumonia with pleural effusion: When she was admitted she was started on empiric antibiotics vancomycin and Zosyn, pulmonary was consulted to T multilobar infiltrates right greater than left. Thoracocentesis was done and fluid had no growth. Sputum culture grew MRSA, ID was consulted and recommended to change antibiotic coverage to Zyvox.  HIV, and urine antigens were negative. SNF - zyvox for 2 more days  Pneumonia of right lung due to methicillin resistant Staphylococcus aureus (MRSA) ([place]) multilobar infiltrates right greater than left. Thoracocentesis was done and fluid had no growth. Sputum culture grew MRSA, ID was consulted and recommended to change antibiotic coverage to Zyvox SNF - zyvox for 2 more days  Necrotizing pneumonia ([place]) multilobar infiltrates right greater than left. Thoracocentesis was done and fluid had no growth. Sputum culture grew MRSA, ID was consulted and recommended to change antibiotic coverage to Zyvox SNF - zyvox for 2 more days; rec is for CT 2 weeks to r/o malignancy? - no odd cells in thoracentesis, nodes borderline and felt to be reactive so why?  Acute respiratory failure with hypoxia ([facility]) Likely due to necrotizing MRSA pneumonia in the setting of underlying COPD. At home she is on 2 L of nasal cannula on admission she had to be placed on 4-6 L to keep saturations above 90%. On the day of discharge  her saturations remain greater than 90% on 2 L. SNF - cont 2 L [place] and wean when possible  Abdominal pain SNF - reported as chronic;oxycodone 5 mg q4?prn;plan - do not escalate pain medicaitons, will try to de-escalate  Protein-calorie malnutrition, severe SNF - cont ensure TID  Microcytic anemia Reported pt was given IV iron SNF - pt sent out on liquid iron TID which was changed to daily  Depression SNF - not stated as unstable; cont wellbutrin 150 mg daily  GERD (gastroesophageal reflux disease) SNF - not stated as unstable; cont nexium 40 mg daily and librax ac and qHS  Vitamin D deficiency SNF - cnt Vit D 1000 u daily    Time spent > 45 min;> 50% of time with patient was spent reviewing records, labs, tests and studies, counseling and developing plan of care  Hennie Duos, MD

## 2015-08-21 NOTE — Assessment & Plan Note (Addendum)
multilobar infiltrates right greater than left. Thoracocentesis was done and fluid had no growth. Sputum culture grew MRSA, ID was consulted and recommended to change antibiotic coverage to Zyvox SNF - zyvox for 2 more days; rec is for CT 2 weeks to r/o malignancy? - no odd cells in thoracentesis, nodes borderline and felt to be reactive so why?

## 2015-08-21 NOTE — Assessment & Plan Note (Signed)
Reported pt was given IV iron SNF - pt sent out on liquid iron TID which was changed to daily

## 2015-08-21 NOTE — Assessment & Plan Note (Signed)
multilobar infiltrates right greater than left. Thoracocentesis was done and fluid had no growth. Sputum culture grew MRSA, ID was consulted and recommended to change antibiotic coverage to Zyvox SNF - zyvox for 2 more days

## 2015-08-21 NOTE — Assessment & Plan Note (Signed)
due to healthcare associated pneumonia with pleural effusion: When she was admitted she was started on empiric antibiotics vancomycin and Zosyn, pulmonary was consulted to T multilobar infiltrates right greater than left. Thoracocentesis was done and fluid had no growth. Sputum culture grew MRSA, ID was consulted and recommended to change antibiotic coverage to Zyvox.  HIV, and urine antigens were negative. SNF - zyvox for 2 more days

## 2015-08-21 NOTE — Assessment & Plan Note (Addendum)
SNF - not stated as unstable; cont nexium 40 mg daily and librax ac and qHS

## 2015-08-21 NOTE — Assessment & Plan Note (Signed)
SNF - not stated as unstable; cont wellbutrin 150 mg daily

## 2015-08-21 NOTE — Assessment & Plan Note (Signed)
Likely due to necrotizing MRSA pneumonia in the setting of underlying COPD. At home she is on 2 L of nasal cannula on admission she had to be placed on 4-6 L to keep saturations above 90%. On the day of discharge her saturations remain greater than 90% on 2 L. SNF - cont 2 L [place] and wean when possible

## 2015-08-22 ENCOUNTER — Encounter: Payer: Self-pay | Admitting: Internal Medicine

## 2015-08-22 ENCOUNTER — Non-Acute Institutional Stay (SKILLED_NURSING_FACILITY): Payer: Medicare Other | Admitting: Internal Medicine

## 2015-08-22 DIAGNOSIS — M7989 Other specified soft tissue disorders: Secondary | ICD-10-CM

## 2015-08-22 DIAGNOSIS — Z91199 Patient's noncompliance with other medical treatment and regimen due to unspecified reason: Secondary | ICD-10-CM

## 2015-08-22 DIAGNOSIS — Z9119 Patient's noncompliance with other medical treatment and regimen: Secondary | ICD-10-CM | POA: Diagnosis not present

## 2015-08-22 DIAGNOSIS — R4189 Other symptoms and signs involving cognitive functions and awareness: Secondary | ICD-10-CM | POA: Diagnosis not present

## 2015-08-22 NOTE — Assessment & Plan Note (Addendum)
On BIMS, score is several points away from dementia. This is also making it harder to get her to allow [place] to help her. Pt is already on wellbutrin for depression.

## 2015-08-22 NOTE — Progress Notes (Signed)
MRN: [id number] Name: Autumn Turner  Sex: female Age: 54 y.o. DOB: [birth year]  [place] #: [facility] Facility/Room:509 Level Of Care: SNF Provider: Inocencio Homes D Emergency Contacts: Extended Emergency Contact Information Primary Emergency Contact: Patane,Scott Address: [address]          [place], [place] [address] [place] Mobile Phone: [phone] Relation: Spouse  Code Status:   Allergies: Ciprocinonide; Ciprofloxacin; Dust mite extract; Iohexol; and Nsaids  Chief Complaint  Patient presents with  . Acute Visit    HPI: Patient is 54 y.o. female who was admitted to SNF with generalized weakness form necrotizing MRSA PNA is being seen today acutely for several new issues.  Pt had swelling to dorsum of R hand only yesterday with erythema, TTP  and warmth, felt to be an early cellulitis and I extended zyvox for 3 more days. Today the forearm has swelling and warmth and TTP. Pt does not have a fever, or acute MS change but she does have a MS problem, cognitive impairment,  which leads to next problem of her refusing treatment, including meds and PT.  Past Medical History  Diagnosis Date  . Abnormal Pap smear   . Vulvar lesion   . CIN II (cervical intraepithelial neoplasia II)   . Malnourished ([place])   . Sleep apnea     Loss weight with gastric bypass  . Cancer ([place])     vulvar  . Anemia     blood transfusion  . Hypertension     no longer on medication  . Anxiety   . Depression   . History of stomach ulcers     Past Surgical History  Procedure Laterality Date  . Cesarean section    . Gastric bypass  2001  . Gallbladder surgery  2002  . Tummy tuck  2004  . Leep  06/2009  . Cholecystectomy    . Tonsillectomy      age 67 adnoids  . Vulvectomy Bilateral 12/07/2012    Procedure: VULVECTOMY AND BILATERAL INGUINAL LYMPH NODE DISSECTION, PAP SMEAR;  Surgeon: Janie Morning, MD;  Location: [facility] ORS;  Service: Gynecology;  Laterality: Bilateral;  . Revision to  gastric bypass    . Esophagogastroduodenoscopy N/A 06/29/2015    Procedure: ESOPHAGOGASTRODUODENOSCOPY (EGD);  Surgeon: Clarene Essex, MD;  [facility] END[facility];  Service: Endoscopy;  Laterality: N/A;  . Balloon dilation N/A 06/29/2015    Procedure: BALLOON DILATION;  Surgeon: Clarene Essex, MD;  Location: [facility] ENDOSCOPY;  Service: Endoscopy;  Laterality: N/A;  . Esophagogastroduodenoscopy (egd) with propofol N/A 07/17/2015    Procedure: ESOPHAGOGASTRODUODENOSCOPY (EGD) WITH PROPOFOL;  Surgeon: Clarene Essex, MD;  Location: [facility] END[facility]COPY;  Service: Endoscopy;  Laterality: N/A;  . Balloon dilation N/A 07/17/2015    Procedure: BALLOON DILATION;  Surgeon: Clarene Essex, MD;  Location: [facility] ENDOSCOPY;  Service: Endoscopy;  Laterality: N/A;  . Esophageal stent placement N/A 07/17/2015    Procedure: ESOPHAGEAL STENT PLACEMENT;  Surgeon: Clarene Essex, MD;  Location: [facility] ENDOSCOPY;  Service: Endoscopy;  Laterality: N/A;      Medication List       This list is accurate as of: 08/22/15  1:21 PM.  Always use your most recent med list.               buPROPion 150 MG 24 hr tablet  Commonly known as:  WELLBUTRIN XL  Take 300 mg by mouth daily.     cholecalciferol 1000 UNITS tablet  Commonly known as:  VITAMIN D  Take 1,000  Units by mouth daily.     clidinium-chlordiazePOXIDE 5-2.5 MG capsule  Commonly known as:  LIBRAX  Take 1 capsule by mouth 4 (four) times daily -  before meals and at bedtime.     diphenhydrAMINE 12.5 MG chewable tablet  Commonly known as:  BENADRYL  Chew 12.5 mg by mouth 4 (four) times daily as needed for allergies.     esomeprazole 20 MG capsule  Commonly known as:  NEXIUM  Take 20 mg by mouth daily.     ferrous sulfate 300 (60 FE) MG/5ML syrup  Take 5 mLs (300 mg total) by mouth daily.     linezolid 600 MG tablet  Commonly known as:  ZYVOX  Take 1 tablet (600 mg total) by mouth every 12 (twelve) hours.     loratadine 10 MG tablet  Commonly known as:  CLARITIN  Take 10 mg by  mouth daily as needed for allergies or rhinitis.     MULTIVITAMIN PO  Take 1 tablet by mouth daily.     oxyCODONE 5 MG/5ML solution  Commonly known as:  ROXICODONE  Take 5 mLs (5 mg total) by mouth every 4 (four) hours as needed for moderate pain.     protein supplement Powd  Commonly known as:  UNJURY CHICKEN SOUP  Take 7 g (2 oz total) by mouth daily.     traMADol 50 MG tablet  Commonly known as:  ULTRAM  TAKE 1 TABLET BY MOUTH THREE TIMES DAILY AS NEEDED     VISINE OP  Place 1-2 drops into both eyes as needed (for dry eyes).     vitamin B-12 1000 MCG tablet  Commonly known as:  CYANOCOBALAMIN  Take 1,000 mcg by mouth daily.     vitamin C 500 MG tablet  Commonly known as:  ASCORBIC ACID  Take 500 mg by mouth daily.        No orders of the defined types were placed in this encounter.     There is no immunization history on file for this patient.  Social History  Substance Use Topics  . Smoking status: Current Some Day Smoker -- 0.50 packs/day for 20 years    Types: Cigarettes  . Smokeless tobacco: Never Used  . Alcohol Use: No    Review of Systems  DATA OBTAINED: from patient, nurse, ST, PT  - see HPI GENERAL:  no fevers,+ fatigue, appetite changes SKIN: warmth and TTP R forearm and hand HEENT: No complaint RESPIRATORY: No cough, wheezing, SOB CARDIAC: No chest pain, palpitations, lower extremity edema  GI- + abdominal pain from the antibiotics and iron and vitamins No N/V/D or constipation, No heartburn or reflux  GU: No dysuria, frequency or urgency, or incontinence  MUSCULOSKELETAL: No unrelieved bone/joint pain NEUROLOGIC: No headache, dizziness  PSYCHIATRIC: pt overwhelmed  Filed Vitals:   08/22/15 1243  BP: 116/79  Pulse: 84  Temp: 97.8 F (36.6 C)  Resp: 16    Physical Exam  GENERAL APPEARANCE: Alert, conversant,anxious SKIN: RUE - hand and forearm are with swelling ,armth and TTP HEENT: Unremarkable RESPIRATORY: Breathing is even,  unlabored. Lung sounds are clear   CARDIOVASCULAR: Heart RRR no murmurs, rubs or gallops. No peripheral edema  GASTROINTESTINAL: Abdomen is soft, non-tender, not distended w/ normal bowel sounds.  GENITOURINARY: Bladder non tender, not distended  MUSCULOSKELETAL: No abnormal joints or musculature NEUROLOGIC: Cranial nerves 2-12 grossly intact. Moves all extremities PSYCHIATRIC: pt is tearful, no behavioral issues  Patient Active Problem List   Diagnosis Date  Noted  . Non compliance with medical treatment 08/22/2015  . Cognitive impairment 08/22/2015  . Depression 08/21/2015  . GERD (gastroesophageal reflux disease) 08/21/2015  . Vitamin D deficiency 08/21/2015  . Pressure ulcer 08/20/2015  . Protein-calorie malnutrition, severe 08/17/2015  . Pneumonia of right lung due to methicillin resistant Staphylococcus aureus (MRSA) ([place]) 08/17/2015  . Acute respiratory failure with hypoxia ([place]) 08/14/2015  . Necrotizing pneumonia ([place]) 08/14/2015  . Microcytic anemia 08/14/2015  . Sepsis ([place]) 08/14/2015  . Pleural effusion   . HCAP (healthcare-associated pneumonia) 08/13/2015  . Hypokalemia 08/13/2015  . Anemia 08/13/2015  . Abdominal pain 08/13/2015  . 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 Cellulitis of groin, right 12/16/2012  . 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    CBC    Component Value Date/Time   WBC 8.8 08/16/2015 0640   WBC 4.7 01/07/2015 1417   RBC 2.83* 08/16/2015 0640   RBC 3.59* 01/07/2015 1417   HGB 7.1* 08/16/2015 0640   HGB 9.0* 01/07/2015 1417   HCT 23.2* 08/16/2015 0640   HCT 29.6* 01/07/2015 1417   PLT 463* 08/16/2015 0640   MCV 82.0 08/16/2015 0640   MCV 82.4 01/07/2015 1417   LYMPHSABS 0.3* 08/16/2015 0640   MONOABS 0.4 08/16/2015 0640   EOSABS 0.1 08/16/2015 0640   BASOSABS 0.0 08/16/2015 0640    CMP     Component Value Date/Time   NA 137 08/18/2015 0339   K 4.2 08/18/2015 0339   CL 93* 08/18/2015 0339   CO2 38* 08/18/2015 0339   GLUCOSE 85 08/18/2015  0339   BUN <5* 08/18/2015 0339   CREATININE 0.33* 08/18/2015 0339   CREATININE 0.55 01/07/2015 1406   CALCIUM 8.5* 08/18/2015 0339   PROT 4.5* 08/16/2015 0640   ALBUMIN 1.1* 08/16/2015 0640   AST 15 08/16/2015 0640   ALT 9* 08/16/2015 0640   ALKPHOS 172* 08/16/2015 0640   BILITOT 0.7 08/16/2015 0640   GFRNONAA >60 08/18/2015 0339   GFRAA >60 08/18/2015 0339    Assessment and Plan  SWELLING  AND WARMTH RUE -  Could be cellulitis, but today nmore worried about a DVT; have ordered a RUR U/S  Non compliance with medical treatment Pt is refusing to take zyvox, she says it tears up her stomach. She also said the iron and vitamins are tearing up her stomach. i made a deal with her. The iron, vit B12 and  Vit C can be d/c if she will take her zyvox. If she ends up with a RUE DVT the zyvox will be a non issue. I also spoke with her at length about PT/OT  Cognitive impairment On BIMS, score is several points away from dementia. This is also making it harder to get her to allow [place] to help her. Pt is already on wellbutrin for depression.   Time spent with pt, chart, nursing , ST, PT > 35 min;> 50% of time with patient was spent reviewing records, labs, tests and studies, counseling and developing plan of care  Hennie Duos, MD

## 2015-08-22 NOTE — Assessment & Plan Note (Addendum)
Pt is refusing to take zyvox, she says it tears up her stomach. She also said the iron and vitamins are tearing up her stomach. i made a deal with her. The iron, vit B12 and  Vit C can be d/c if she will take her zyvox. If she ends up with a RUE DVT the zyvox will be a non issue. I also spoke with her at length about PT/OT

## 2015-08-23 ENCOUNTER — Other Ambulatory Visit: Payer: Self-pay | Admitting: Gastroenterology

## 2015-08-23 ENCOUNTER — Non-Acute Institutional Stay (SKILLED_NURSING_FACILITY): Payer: Medicare Other | Admitting: Internal Medicine

## 2015-08-23 DIAGNOSIS — R600 Localized edema: Secondary | ICD-10-CM | POA: Diagnosis not present

## 2015-08-23 DIAGNOSIS — J850 Gangrene and necrosis of lung: Secondary | ICD-10-CM | POA: Diagnosis not present

## 2015-08-23 DIAGNOSIS — T85528A Displacement of other gastrointestinal prosthetic devices, implants and grafts, initial encounter: Secondary | ICD-10-CM

## 2015-08-23 DIAGNOSIS — R609 Edema, unspecified: Secondary | ICD-10-CM | POA: Insufficient documentation

## 2015-08-23 DIAGNOSIS — R1319 Other dysphagia: Secondary | ICD-10-CM

## 2015-08-23 DIAGNOSIS — E43 Unspecified severe protein-calorie malnutrition: Secondary | ICD-10-CM

## 2015-08-23 NOTE — Progress Notes (Signed)
Patient ID: [name], female   DOB: December 15, 1960, 54 y.o.   MRN: [id number] MRN: [id number] Name: Autumn Turner  Sex: female Age: 54 y.o. DOB: 15-Nov-1960  [place] [facility] Facility/Room:509 Level Of Care: SNF Provider: Wille Celeste Emergency Contacts: Extended Emergency Contact Information Primary Emergency Contact: Crail,Scott Address: [address]          [place], [place] [address] [place] Mobile Phone: [phone] Relation: Spouse  Code Status:   Allergies: Ciprocinonide; Ciprofloxacin; Dust mite extract; Iohexol; and Nsaids  Chief Complaint  Patient presents with  . Acute Visit   secondary to GI issues patient feeling Really eat or drink significantly without spitting up Also follow-up right hand edema  HPI: Patient is 54 y.o. female who was admitted to SNF with generalized weakness form necrotizing MRSA PNA .  Pt had swelling to dorsum of R hand that as of yesterday extended to her forearm-Dr. Sheppard Coil did order a venous Doppler which was negative for DVT-she also extended her Zyvox several more days secondary concerns of cellulitis . Pt does not have a fever, or acute MS change but she does have a MS problem, cognitive impairment,  which leads to  her refusing treatment, including meds and PT  Today the edema on the right appears resolved however she does have some edema on the left hand-begin a venous Doppler of this as well was negative.  She is complaining of not really being able to eat or drink much at all without spitting up-.  Per chart review she does have an extensive GI history with an esophageal stent. Placement approximately a month ago by Dr.Magod---  Her vital signs remained stable-.    Past Medical History  Diagnosis Date  . Abnormal Pap smear   . Vulvar lesion   . CIN II (cervical intraepithelial neoplasia II)   . Malnourished ([place])   . Sleep apnea     Loss weight with gastric bypass  . Cancer ([place])     vulvar  . Anemia    blood transfusion  . Hypertension     no longer on medication  . Anxiety   . Depression   . History of stomach ulcers     Past Surgical History  Procedure Laterality Date  . Cesarean section    . Gastric bypass  2001  . Gallbladder surgery  2002  . Tummy tuck  2004  . Leep  06/2009  . Cholecystectomy    . Tonsillectomy      age 19 adnoids  . Vulvectomy Bilateral 12/07/2012    Procedure: VULVECTOMY AND BILATERAL INGUINAL LYMPH NODE DISSECTION, PAP SMEAR;  Surgeon: Janie Morning, MD;  Location: [facility];  Service: Gynecology;  Laterality: Bilateral;  . Revision to gastric bypass    . Esophagogastroduodenoscopy N/A 06/29/2015    Procedure: ESOPHAGOGASTRODUODENOSCOPY (EGD);  Surgeon: Clarene Essex, MD;  Location: [facility] ENDOSCOPY;  Service: Endoscopy;  Laterality: N/A;  . Balloon dilation N/A 06/29/2015    Procedure: BALLOON DILATION;  Surgeon: Clarene Essex, MD;  Location: [facility] ENDOSCOPY;  Service: Endoscopy;  Laterality: N/A;  . Esophagogastroduodenoscopy (egd) with propofol N/A 07/17/2015    Procedure: ESOPHAGOGASTRODUODENOSCOPY (EGD) WITH PROPOFOL;  Surgeon: Clarene Essex, MD;  Location: [facility] ENDOSCOPY;  Service: Endoscopy;  Laterality: N/A;  . Balloon dilation N/A 07/17/2015    Procedure: BALLOON DILATION;  Surgeon: Clarene Essex, MD;  Location: [facility] ENDOSCOPY;  Service: Endoscopy;  Laterality: N/A;  . Esophageal stent placement N/A 07/17/2015    Procedure: ESOPHAGEAL  STENT PLACEMENT;  Surgeon: Clarene Essex, MD;  Location: [facility] ENDOSCOPY;  Service: Endoscopy;  Laterality: N/A;  . Esophagogastroduodenoscopy N/A 08/24/2015    Procedure: ESOPHAGOGASTRODUODENOSCOPY (EGD);  Surgeon: Clarene Essex, MD;  Location: [facility] ENDOSCOPY;  Service: Endoscopy;  Laterality: N/A;      Medication List       This list is accurate as of: 08/23/15 11:59 PM.  Always use your most recent med list.               buPROPion 150 MG 24 hr tablet  Commonly known as:  WELLBUTRIN XL  Take 300 mg by mouth daily.     cholecalciferol  1000 UNITS tablet  Commonly known as:  VITAMIN D  Take 1,000 Units by mouth daily.     clidinium-chlordiazePOXIDE 5-2.5 MG capsule  Commonly known as:  LIBRAX  Take 1 capsule by mouth 4 (four) times daily -  before meals and at bedtime.     diphenhydrAMINE 12.5 MG chewable tablet  Commonly known as:  BENADRYL  Chew 12.5 mg by mouth 4 (four) times daily as needed for allergies.     esomeprazole 20 MG capsule  Commonly known as:  NEXIUM  Take 20 mg by mouth daily.     ferrous sulfate 300 (60 FE) MG/5ML syrup  Take 5 mLs (300 mg total) by mouth daily.     linezolid 600 MG tablet  Commonly known as:  ZYVOX  Take 1 tablet (600 mg total) by mouth every 12 (twelve) hours.     loratadine 10 MG tablet  Commonly known as:  CLARITIN  Take 10 mg by mouth daily as needed for allergies or rhinitis.     MULTIVITAMIN PO  Take 1 tablet by mouth daily.     oxyCODONE 5 MG/5ML solution  Commonly known as:  ROXICODONE  Take 5 mLs (5 mg total) by mouth every 4 (four) hours as needed for moderate pain.     protein supplement Powd  Commonly known as:  UNJURY CHICKEN SOUP  Take 7 g (2 oz total) by mouth daily.     traMADol 50 MG tablet  Commonly known as:  ULTRAM  TAKE 1 TABLET BY MOUTH THREE TIMES DAILY AS NEEDED     VISINE OP  Place 1-2 drops into both eyes as needed (for dry eyes).     vitamin B-12 1000 MCG tablet  Commonly known as:  CYANOCOBALAMIN  Take 1,000 mcg by mouth daily.     vitamin C 500 MG tablet  Commonly known as:  ASCORBIC ACID  Take 500 mg by mouth daily.        No orders of the defined types were placed in this encounter.     There is no immunization history on file for this patient.  Social History  Substance Use Topics  . Smoking status: Current Some Day Smoker -- 0.50 packs/day for 20 years    Types: Cigarettes  . Smokeless tobacco: Never Used  . Alcohol Use: No    Review of Systems  DATA OBTAINED: from patient, nurse, ST, PT  - see HPI GENERAL:   no fevers,+ fatigue, decreased appetite changes SKIN:  some edema now of her left hand HEENT: No complaint RESPIRATORY: No cough, wheezing, SOB CARDIAC: No chest pain, palpitations, lower extremity edema  GI- feels like she can only eat or drink very small portions without feeling somewhat nauseous-does not really specifically complain of abdominal pain however  GU: No dysuria, frequency or urgency, or incontinence  MUSCULOSKELETAL: No unrelieved bone/joint pain NEUROLOGIC: No headache, dizziness  PSYCHIATRIC: pt overwhelmed    Physical Exam Temperature 98.7 pulse 80 respirations 20 blood pressure 129/77  GENERAL APPEARANCE: Alert, conversant,anxious SKIN: left hand  Low arm with some edema this does not appear to be acutely tender however or erythematous--radial pulse is intact HEENT: Oropharynx is clear RESPIRATORY: Breathing is even, unlabored. Lung sounds are clear   CARDIOVASCULAR: Heart RRR no murmurs, rubs or gallops. No peripheral edema  GASTROINTESTINAL: Abdomen is soft, non-tender, not distended w/ normal bowel sounds.  GENITOURINARY: Bladder non tender, not distended  MUSCULOSKELETAL: No abnormal joints or musculature--quite frail appearing NEUROLOGIC: Cranial nerves 2-12 grossly intact. Moves all extremities PSYCHIATRIC: pt is anxious , no behavioral issues  Patient Active Problem List   Diagnosis Date Noted  . 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 Self induced vomiting 08/24/2015  . Narcotic drug use 08/24/2015  . Edema 08/23/2015  . Non compliance with medical treatment 08/22/2015  . Cognitive impairment 08/22/2015  . Depression 08/21/2015  . GERD (gastroesophageal reflux disease) 08/21/2015  . Vitamin D deficiency 08/21/2015  . Pressure ulcer 08/20/2015  . Protein-calorie malnutrition, severe 08/17/2015  . Pneumonia of right lung due to methicillin resistant Staphylococcus aureus (MRSA) ([place]) 08/17/2015  . Acute respiratory failure with hypoxia ([place])  08/14/2015  . Necrotizing pneumonia ([place]) 08/14/2015  . Microcytic anemia 08/14/2015  . Sepsis ([place]) 08/14/2015  . Pleural effusion   . HCAP (healthcare-associated pneumonia) 08/13/2015  . Hypokalemia 08/13/2015  . Anemia 08/13/2015  . Abdominal pain 08/13/2015  . 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 Cellulitis of groin, right 12/16/2012  . 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  Sodium 137 potassium 4.2 BUN less than 5 creatinine 0.33.  08/17/2015.  Magnesium 1.9.  08/16/2015.  Albumin 1.1 AST 15 ALT 9 alkaline phosphatase 172  CBC    Component Value Date/Time   WBC 8.8 08/16/2015 0640   WBC 4.7 01/07/2015 1417   RBC 2.83* 08/16/2015 0640   RBC 3.59* 01/07/2015 1417   HGB 7.1* 08/16/2015 0640   HGB 9.0* 01/07/2015 1417   HCT 23.2* 08/16/2015 0640   HCT 29.6* 01/07/2015 1417   PLT 463* 08/16/2015 0640   MCV 82.0 08/16/2015 0640   MCV 82.4 01/07/2015 1417   LYMPHSABS 0.3* 08/16/2015 0640   MONOABS 0.4 08/16/2015 0640   EOSABS 0.1 08/16/2015 0640   BASOSABS 0.0 08/16/2015 0640    CMP     Component Value Date/Time   NA 137 08/18/2015 0339   K 4.2 08/18/2015 0339   CL 93* 08/18/2015 0339   CO2 38* 08/18/2015 0339   GLUCOSE 85 08/18/2015 0339   BUN <5* 08/18/2015 0339   CREATININE 0.33* 08/18/2015 0339   CREATININE 0.55 01/07/2015 1406   CALCIUM 8.5* 08/18/2015 0339   PROT 4.5* 08/16/2015 0640   ALBUMIN 1.1* 08/16/2015 0640   AST 15 08/16/2015 0640   ALT 9* 08/16/2015 0640   ALKPHOS 172* 08/16/2015 0640   BILITOT 0.7 08/16/2015 0640   GFRNONAA >60 08/18/2015 0339   GFRAA >60 08/18/2015 0339    Assessment and Plan  #1 what appears to be some dysphasia inability to eat without very small sips or bites-patient's by mouth intake apparently is quite minimal-I did speak with Dr. Dory Horn is concerned about esophageal stent.  There may also be a psychological component to thisas well- Dr. Watt Climes has arranged to see her early tomorrow morning-he is  considering stent removal.  Clinically patient does not appear to be unstable  although she is quite weak She will need to be nothing by mouth after midnight.  Patient also is refusing numerous medications this may be due to her concern about being able to swallow days as well-again will await Dr Perley Jain input which is greatly appreciated.  I did discuss the plan with patient and her husband and they are in agreement.  #2 in regards to localized edema this could be due to her low albumin level at this point will monitor again venous Dopplers have been negative of both hands and arms.  #3 history of necrotizing pneumonia-she is completing course of Zyvox again this was extended by Dr. Sheppard Coil for concerns of cellulitis in her hands-this appears to be better today however  [id number]-of note greater than 50 minutes spent assessing patient-discussing her status with Dr. Watt Climes via phone-and coordinating plan of care-as well as extensive chart review.  Of note greater than 50% of time spent coordinating plan of care as noted above

## 2015-08-24 ENCOUNTER — Encounter (HOSPITAL_COMMUNITY)
Admission: RE | Disposition: A | Discharge status: Skilled Nursing Facility | Payer: Medicare Other | Source: Ambulatory Visit | Attending: Gastroenterology

## 2015-08-24 ENCOUNTER — Encounter: Payer: Self-pay | Admitting: Internal Medicine

## 2015-08-24 ENCOUNTER — Ambulatory Visit (HOSPITAL_COMMUNITY)
Admission: RE | Admit: 2015-08-24 | Discharge: 2015-08-24 | Disposition: A | Discharge status: Skilled Nursing Facility | Payer: Medicare Other | Source: Ambulatory Visit | Attending: Gastroenterology | Admitting: Gastroenterology

## 2015-08-24 ENCOUNTER — Ambulatory Visit (HOSPITAL_COMMUNITY): Payer: Medicare Other | Admitting: Anesthesiology

## 2015-08-24 ENCOUNTER — Non-Acute Institutional Stay (SKILLED_NURSING_FACILITY): Payer: Medicare Other | Admitting: Internal Medicine

## 2015-08-24 ENCOUNTER — Encounter (HOSPITAL_COMMUNITY): Payer: Self-pay | Admitting: Certified Registered Nurse Anesthetist

## 2015-08-24 DIAGNOSIS — D649 Anemia, unspecified: Secondary | ICD-10-CM | POA: Insufficient documentation

## 2015-08-24 DIAGNOSIS — F5089 Other specified eating disorder: Secondary | ICD-10-CM | POA: Diagnosis not present

## 2015-08-24 DIAGNOSIS — Z9884 Bariatric surgery status: Secondary | ICD-10-CM | POA: Insufficient documentation

## 2015-08-24 DIAGNOSIS — F1190 Opioid use, unspecified, uncomplicated: Secondary | ICD-10-CM

## 2015-08-24 DIAGNOSIS — Z8711 Personal history of peptic ulcer disease: Secondary | ICD-10-CM | POA: Insufficient documentation

## 2015-08-24 DIAGNOSIS — K9189 Other postprocedural complications and disorders of digestive system: Secondary | ICD-10-CM | POA: Diagnosis not present

## 2015-08-24 DIAGNOSIS — K3189 Other diseases of stomach and duodenum: Secondary | ICD-10-CM | POA: Diagnosis not present

## 2015-08-24 DIAGNOSIS — F329 Major depressive disorder, single episode, unspecified: Secondary | ICD-10-CM | POA: Diagnosis not present

## 2015-08-24 DIAGNOSIS — R600 Localized edema: Secondary | ICD-10-CM | POA: Insufficient documentation

## 2015-08-24 DIAGNOSIS — Z79891 Long term (current) use of opiate analgesic: Secondary | ICD-10-CM | POA: Diagnosis not present

## 2015-08-24 DIAGNOSIS — F17210 Nicotine dependence, cigarettes, uncomplicated: Secondary | ICD-10-CM | POA: Insufficient documentation

## 2015-08-24 DIAGNOSIS — R112 Nausea with vomiting, unspecified: Secondary | ICD-10-CM | POA: Diagnosis present

## 2015-08-24 DIAGNOSIS — I10 Essential (primary) hypertension: Secondary | ICD-10-CM | POA: Insufficient documentation

## 2015-08-24 DIAGNOSIS — K219 Gastro-esophageal reflux disease without esophagitis: Secondary | ICD-10-CM | POA: Diagnosis not present

## 2015-08-24 DIAGNOSIS — G4730 Sleep apnea, unspecified: Secondary | ICD-10-CM | POA: Diagnosis not present

## 2015-08-24 DIAGNOSIS — Z79899 Other long term (current) drug therapy: Secondary | ICD-10-CM | POA: Insufficient documentation

## 2015-08-24 DIAGNOSIS — K449 Diaphragmatic hernia without obstruction or gangrene: Secondary | ICD-10-CM | POA: Insufficient documentation

## 2015-08-24 DIAGNOSIS — K929 Disease of digestive system, unspecified: Secondary | ICD-10-CM

## 2015-08-24 DIAGNOSIS — R633 Feeding difficulties: Secondary | ICD-10-CM | POA: Insufficient documentation

## 2015-08-24 HISTORY — PX: ESOPHAGOGASTRODUODENOSCOPY: SHX5428

## 2015-08-24 SURGERY — EGD (ESOPHAGOGASTRODUODENOSCOPY)
Anesthesia: Monitor Anesthesia Care

## 2015-08-24 MED ORDER — PROPOFOL 10 MG/ML IV BOLUS
INTRAVENOUS | Status: AC
  Filled 2015-08-24: qty 40

## 2015-08-24 MED ORDER — PROPOFOL 500 MG/50ML IV EMUL
INTRAVENOUS | Status: DC | PRN
  Administered 2015-08-24: 100 ug/kg/min via INTRAVENOUS

## 2015-08-24 MED ORDER — LIDOCAINE HCL (CARDIAC) 20 MG/ML IV SOLN
INTRAVENOUS | Status: DC | PRN
  Administered 2015-08-24: 50 mg via INTRAVENOUS

## 2015-08-24 MED ORDER — LACTATED RINGERS IV SOLN
INTRAVENOUS | Status: DC
  Administered 2015-08-24: 1000 mL via INTRAVENOUS

## 2015-08-24 MED ORDER — PROPOFOL 500 MG/50ML IV EMUL
INTRAVENOUS | Status: DC | PRN
  Administered 2015-08-24 (×2): 30 mg via INTRAVENOUS

## 2015-08-24 MED ORDER — LACTATED RINGERS IV SOLN
INTRAVENOUS | Status: DC | PRN
  Administered 2015-08-24: 08:00:00 via INTRAVENOUS

## 2015-08-24 MED ORDER — SODIUM CHLORIDE 0.9 % IV SOLN: INTRAVENOUS | Status: DC

## 2015-08-24 MED ORDER — GLUCAGON HCL RDNA (DIAGNOSTIC) 1 MG IJ SOLR
INTRAMUSCULAR | Status: AC
  Filled 2015-08-24: qty 1

## 2015-08-24 NOTE — Anesthesia Postprocedure Evaluation (Signed)
Anesthesia Post Note  Patient: Autumn Turner  Procedure(s) Performed: Procedure(s) (LRB): ESOPHAGOGASTRODUODENOSCOPY (EGD) (N/A)  Patient location during evaluation: PACU Anesthesia Type: MAC Level of consciousness: awake and alert Pain management: pain level controlled Vital Signs Assessment: post-procedure vital signs reviewed and stable Respiratory status: spontaneous breathing, nonlabored ventilation, respiratory function stable and patient connected to nasal cannula oxygen Cardiovascular status: stable and blood pressure returned to baseline Anesthetic complications: no    Last Vitals:  Filed Vitals:   08/24/15 0900 08/24/15 0910  BP: 143/64 154/65  Pulse: 81 81  Temp:    Resp: 19 18    Last Pain:  Filed Vitals:   08/24/15 0941  PainSc: 10-Worst pain ever                 Lilo [place] J

## 2015-08-24 NOTE — Op Note (Signed)
[facility] [place], [address]   ENDOSCOPY PROCEDURE REPORT  PATIENT: Autumn Turner, Autumn Turner  MR#: [id number] BIRTHDATE: 1961/04/13 , 70  yrs. old GENDER: female ENDOSCOPIST: Clarene Essex, MD REFERRED BY: PROCEDURE DATE:  09/11/2015 PROCEDURE:  EGD w/ fb removal ASA CLASS:     Class III INDICATIONS:  foreign body removal from stomach. MEDICATIONS: Propofol 200 mg IV TOPICAL ANESTHETIC: none  DESCRIPTION OF PROCEDURE: After the risks benefits and alternatives of the procedure were thoroughly explained, informed consent was obtained.  The Pentax Gastroscope [id number] endoscope was introduced through the mouth and advanced to thegastric pouchand the previously placed covered stent was seen and it was in place but fairly proximal in thegastric remnant and the blue pull cord was grabbedwith the Raptor tallan grabber and removed in the customary fashion and then the scope was reinsertedand easily advanced past the anastomosisinto the proximal jejunum , Without limitations. The instrument was slowly withdrawn as the mucosa was fully examined. Estimated blood loss is zero unless otherwise noted in this procedure report.    indings are recorded below       Retroflexion was not performed. The scope was then withdrawn from the patient and the procedure completed.  COMPLICATIONS: There were no immediate complications.  ENDOSCOPIC IMPRESSION:.1. Stent removed from gastric remnant as above 2.Small hiatal hernia with minimal esophageal trauma from stent removal 3. Anastomosis with large ulcerand two staples seen   but easily able to advance scope into proximal jejunum 4.Otherwiseno additional findings  RECOMMENDATIONS: slowly advance diet   call me when necessary otherwise follow-up in 2 weeks and continue pump inhibitors and Carafateand no aspirin or nonsteroidalslong-termand again call sooner if question or problemand probably consider TPNnext  REPEAT EXAM: as  needed  eSigned:  Clarene Essex, MD Sep 11, 2015 8:52 AM Revised: 09-11-15 8:52 AM   CC:  CPT CODES: ICD CODES:  The ICD and CPT codes recommended by this software are interpretations from the data that the clinical staff has captured with the software.  The verification of the translation of this report to the ICD and CPT codes and modifiers is the sole responsibility of the health care institution and practicing physician where this report was generated.  [place]. will not be held responsible for the validity of the ICD and CPT codes included on this report.  AMA assumes no liability for data contained or not contained herein. CPT is a Designer, television/film set of the [employer].  PATIENT NAME:  Autumn Turner, Autumn Turner MR#: [id number]

## 2015-08-24 NOTE — Assessment & Plan Note (Signed)
Nursing reports pt asks for pain meds when not in pain. To me when I asked her what hurts she says her arms, so bad because of the swelling. This makes no sense based on actual physical findings. I told pt and husband constipation from narcotics will makeher stomach problems worse. Will try to de-escalate-change oxycodone 5 mg from q 4 to q6 prn. Tramadol is q 8 prn

## 2015-08-24 NOTE — Progress Notes (Signed)
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

## 2015-08-24 NOTE — Progress Notes (Addendum)
MRN: [id number] Name: Autumn Turner  Sex: female Age: 54 y.o. DOB: [birth year]  [place] #: [facility] Facility/Room:507 Level Of Care: SNF Provider: Inocencio Homes D Emergency Contacts: Extended Emergency Contact Information Primary Emergency Contact: Consuegra,Scott Address: [address]          [place], [place] [address] [place] Mobile Phone: [phone] Relation: Spouse  Code Status:   Allergies: Ciprocinonide; Ciprofloxacin; Dust mite extract; Iohexol; and Nsaids  Chief Complaint  Patient presents with  . Acute Visit    HPI: Patient is 54 y.o. 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 Pt is being seen today acutely because stent was removed this am at [facility] and pt's husband is here to speak with about eating and other issues.  Past Medical History  Diagnosis Date  . Abnormal Pap smear   . Vulvar lesion   . CIN II (cervical intraepithelial neoplasia II)   . Malnourished ([place])   . Sleep apnea     Loss weight with gastric bypass  . Cancer ([place])     vulvar  . Anemia     blood transfusion  . Hypertension     no longer on medication  . Anxiety   . Depression   . History of stomach ulcers     Past Surgical History  Procedure Laterality Date  . Cesarean section    . Gastric bypass  2001  . Gallbladder surgery  2002  . Tummy tuck  2004  . Leep  06/2009  . Cholecystectomy    . Tonsillectomy      age 11 adnoids  . Vulvectomy Bilateral 12/07/2012    Procedure: VULVECTOMY AND BILATERAL INGUINAL LYMPH NODE DISSECTION, PAP SMEAR;  Surgeon: Janie Morning, MD;  Location: [facility] ORS;  Service: Gynecology;  Laterality: Bilateral;  . Revision to gastric bypass    . Esophagogastroduodenoscopy N/A 06/29/2015    Procedure:  ESOPHAGOGASTRODUODENOSCOPY (EGD);  Surgeon: Clarene [place], MD;  Location: [facility] ENDOSCOPY;  Service: Endoscopy;  Laterality: N/A;  . Balloon dilation N/A 06/29/2015    Procedure: BALLOON DILATION;  Surgeon: Clarene [place], MD;  Location: [facility] ENDOSCOPY;  Service: Endoscopy;  Laterality: N/A;  . Esophagogastroduodenoscopy (egd) with propofol N/A 07/17/2015    Procedure: ESOPHAGOGASTRODUODENOSCOPY (EGD) WITH PROPOFOL;  Surgeon: Clarene [place], MD;  Location: [facility] ENDOSCOPY;  Service: Endoscopy;  Laterality: N/A;  . Balloon dilation N/A 07/17/2015    Procedure: BALLOON DILATION;  Surgeon: Clarene [place], MD;  Location: [facility] ENDOSCOPY;  Service: Endoscopy;  Laterality: N/A;  . Esophageal stent placement N/A 07/17/2015    Procedure: ESOPHAGEAL STENT PLACEMENT;  Surgeon: Clarene [place], MD;  Location: [facility] ENDOSCOPY;  Service: Endoscopy;  Laterality: N/A;      Medication List       This list is accurate as of: 08/24/15 12:24 PM.  Always use your most recent med list.               buPROPion 150 MG 24 hr tablet  Commonly known as:  WELLBUTRIN XL  Take 300 mg by mouth daily.     cholecalciferol 1000 UNITS tablet  Commonly known as:  VITAMIN D  Take 1,000 Units by mouth daily.     clidinium-chlordiazePOXIDE 5-2.5 MG capsule  Commonly known as:  LIBRAX  Take  1 capsule by mouth 4 (four) times daily -  before meals and at bedtime.     diphenhydrAMINE 12.5 MG chewable tablet  Commonly known as:  BENADRYL  Chew 12.5 mg by mouth 4 (four) times daily as needed for allergies.     esomeprazole 20 MG capsule  Commonly known as:  NEXIUM  Take 20 mg by mouth daily.     linezolid 600 MG tablet  Commonly known as:  ZYVOX  Take 1 tablet (600 mg total) by mouth every 12 (twelve) hours.     loratadine 10 MG tablet  Commonly known as:  CLARITIN  Take 10 mg by mouth daily as needed for allergies or rhinitis.     MULTIVITAMIN PO  Take 1 tablet by mouth daily.     oxycodone 5 MG capsule  Commonly known as:  OXY-IR  Take 5  mg by mouth every 6 (six) hours as needed.     protein supplement Powd  Commonly known as:  UNJURY CHICKEN SOUP  Take 7 g (2 oz total) by mouth daily.     traMADol 50 MG tablet  Commonly known as:  ULTRAM  TAKE 1 TABLET BY MOUTH THREE TIMES DAILY AS NEEDED     VISINE OP  Place 1-2 drops into both eyes as needed (for dry eyes).     vitamin B-12 1000 MCG tablet  Commonly known as:  CYANOCOBALAMIN  Take 1,000 mcg by mouth daily.        Meds ordered this encounter  Medications  . oxycodone (OXY-IR) 5 MG capsule    Sig: Take 5 mg by mouth every 6 (six) hours as needed.     There is no immunization history on file for this patient.  Social History  Substance Use Topics  . Smoking status: Current Some Day Smoker -- 0.50 packs/day for 20 years    Types: Cigarettes  . Smokeless tobacco: Never Used  . Alcohol Use: No    Review of Systems  DATA OBTAINED: from patient, nurse, husband GENERAL:  no fevers, fatigue, dec appetite SKIN: No itching, rash HEENT: No complaint RESPIRATORY: No cough, wheezing, SOB CARDIAC: No chest pain, palpitations, lower extremity edema  GI: No abdominal pain, No N/V/D or constipation, No heartburn or reflux  GU: No dysuria, frequency or urgency, or incontinence  MUSCULOSKELETAL: armsa hurt from swelling NEUROLOGIC: No headache, dizziness  PSYCHIATRIC: No overt anxiety or sadness  Filed Vitals:   08/24/15 1216  BP: 131/80  Pulse: 80  Temp: 97.7 F (36.5 C)  Resp: 18    Physical Exam  GENERAL APPEARANCE: Alert, conversant, No acute distress  SKIN: No diaphoresis rash; few fading bruising BUE HEENT: Unremarkable RESPIRATORY: Breathing is even, unlabored. Lung sounds are clear   CARDIOVASCULAR: Heart RRR no murmurs, rubs or gallops.; no swelling RUE, mild swelling LUE  GASTROINTESTINAL: Abdomen is soft, non-tender, not distended w/ normal bowel sounds.  GENITOURINARY: Bladder non tender, not distended  MUSCULOSKELETAL: No abnormal  joints or musculature NEUROLOGIC: Cranial nerves 2-12 grossly intact. Moves all extremities PSYCHIATRIC: odd, no behavioral issues  Patient Active Problem List   Diagnosis Date Noted  . 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 Self induced vomiting 08/24/2015  . Narcotic drug use 08/24/2015  . Edema 08/23/2015  . Non compliance with medical treatment 08/22/2015  . Cognitive impairment 08/22/2015  . Depression 08/21/2015  . GERD (gastroesophageal reflux disease) 08/21/2015  . Vitamin D deficiency 08/21/2015  . Pressure ulcer 08/20/2015  . Protein-calorie malnutrition, severe 08/17/2015  .  Pneumonia of right lung due to methicillin resistant Staphylococcus aureus (MRSA) ([place]) 08/17/2015  . Acute respiratory failure with hypoxia ([place]) 08/14/2015  . Necrotizing pneumonia ([place]) 08/14/2015  . Microcytic anemia 08/14/2015  . Sepsis ([place]) 08/14/2015  . Pleural effusion   . HCAP (healthcare-associated pneumonia) 08/13/2015  . Hypokalemia 08/13/2015  . Anemia 08/13/2015  . Abdominal pain 08/13/2015  . 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 Cellulitis of groin, right 12/16/2012  . 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    CBC    Component Value Date/Time   WBC 8.8 08/16/2015 0640   WBC 4.7 01/07/2015 1417   RBC 2.83* 08/16/2015 0640   RBC 3.59* 01/07/2015 1417   HGB 7.1* 08/16/2015 0640   HGB 9.0* 01/07/2015 1417   HCT 23.2* 08/16/2015 0640   HCT 29.6* 01/07/2015 1417   PLT 463* 08/16/2015 0640   MCV 82.0 08/16/2015 0640   MCV 82.4 01/07/2015 1417   LYMPHSABS 0.3* 08/16/2015 0640   MONOABS 0.4 08/16/2015 0640   EOSABS 0.1 08/16/2015 0640   BASOSABS 0.0 08/16/2015 0640    CMP     Component Value Date/Time   NA 137 08/18/2015 0339   K 4.2 08/18/2015 0339   CL 93* 08/18/2015 0339   CO2 38* 08/18/2015 0339   GLUCOSE 85 08/18/2015 0339   BUN <5* 08/18/2015 0339   CREATININE 0.33* 08/18/2015 0339   CREATININE 0.55 01/07/2015 1406   CALCIUM 8.5* 08/18/2015 0339   PROT 4.5*  08/16/2015 0640   ALBUMIN 1.1* 08/16/2015 0640   AST 15 08/16/2015 0640   ALT 9* 08/16/2015 0640   ALKPHOS 172* 08/16/2015 0640   BILITOT 0.7 08/16/2015 0640   GFRNONAA >60 08/18/2015 0339   GFRAA >60 08/18/2015 0339    Assessment and Plan  Gastric anastomotic stricture 2/2 gastric bypass re-do; reported failed dilatation; this morning pt had a stent placed prior removed because she has been having nausea and vomiting with it. GI Dr Watt Climes said she can eat fine after post op clear liquids today and full liquids to tomorrow and advance as tolerated. We discussed some dietary preferences with dietician will pick up in detail. I have already written for prostat BID, ensure with meals and ice cream between meals and that was satisfactory with pt and husband. Pt placed on carafate slurry 1 gm QID and already on nexiem 20 mg daily  Self induced vomiting Several staff members have told me today they have witnessed self induced vomiting. This I believe. Will monitor carefully and document episodes and conference it if needed.  Narcotic drug use Nursing reports pt asks for pain meds when not in pain. To me when I asked her what hurts she says her arms, so bad because of the swelling. This makes no sense based on actual physical findings. I told pt and husband constipation from narcotics will makeher stomach problems worse. Will try to de-escalate-change oxycodone 5 mg from q 4 to q6 prn. Tramadol is q 8 prn   Time spent with pt ,husband, nursing, records > 35 min;> 50% of time with patient was spent reviewing records, labs, tests and studies, counseling and developing plan of care  Hennie Duos, MD

## 2015-08-24 NOTE — Transfer of Care (Signed)
Immediate Anesthesia Transfer of Care Note  Patient: Autumn Turner  Procedure(s) Performed: Procedure(s): ESOPHAGOGASTRODUODENOSCOPY (EGD) (N/A)  Patient Location: PACU  Anesthesia Type:MAC  Level of Consciousness: sedated, patient cooperative and responds to stimulation  Airway & Oxygen Therapy: Patient Spontanous Breathing and Patient connected to nasal cannula oxygen  Post-op Assessment: Report given to RN and Post -op Vital signs reviewed and stable  Post vital signs: Reviewed and stable  Last Vitals:  Filed Vitals:   08/24/15 0746  BP: 135/68  Pulse: 90  Temp: 36.8 C  Resp: 24    Complications: No apparent anesthesia complications

## 2015-08-24 NOTE — Anesthesia Preprocedure Evaluation (Addendum)
Anesthesia Evaluation  Patient identified by MRN, date of birth, ID band Patient awake    Reviewed: Allergy & Precautions, NPO status , Patient's Chart, lab work & pertinent test results  Airway Mallampati: II  TM Distance: >3 FB Neck ROM: Full    Dental no notable dental hx.    Pulmonary sleep apnea , pneumonia, unresolved, Current Smoker,    Pulmonary exam normal breath sounds clear to auscultation       Cardiovascular hypertension, Normal cardiovascular exam Rhythm:Regular Rate:Normal     Neuro/Psych PSYCHIATRIC DISORDERS Anxiety Depression negative neurological ROS     GI/Hepatic Neg liver ROS, GERD  Medicated,  Endo/Other  negative endocrine ROS  Renal/GU negative Renal ROS  negative genitourinary   Musculoskeletal negative musculoskeletal ROS (+)   Abdominal   Peds negative pediatric ROS (+)  Hematology  (+) anemia , Last Hgb 7.1   Anesthesia Other Findings   Reproductive/Obstetrics negative OB ROS                            Anesthesia Physical Anesthesia Plan  ASA: III and emergent  Anesthesia Plan: MAC   Post-op Pain Management:    Induction: Intravenous  Airway Management Planned: Natural Airway  Additional Equipment:   Intra-op Plan:   Post-operative Plan:   Informed Consent: I have reviewed the patients History and Physical, chart, labs and discussed the procedure including the risks, benefits and alternatives for the proposed anesthesia with the patient or authorized representative who has indicated his/her understanding and acceptance.   Dental advisory given  Plan Discussed with: CRNA  Anesthesia Plan Comments:         Anesthesia Quick Evaluation

## 2015-08-24 NOTE — Assessment & Plan Note (Signed)
Several staff members have told me today they have witnessed self induced vomiting. This I believe. Will monitor carefully and document episodes and conference it if needed.

## 2015-08-24 NOTE — Addendum Note (Signed)
Addended byClarene Essex on: 08/24/2015 08:02 AM   Modules accepted: Orders

## 2015-08-24 NOTE — Discharge Instructions (Signed)
Clear liquids today and if doing well this evening may have full liquids and may advance to soft solids as tolerated and no aspirin or nonsteroidals long-term and please make sure she is on a pump inhibitor and Carafate 1 g 4 times a day and call me if any questions or problems otherwise she has an appointment with me the first week in January  Esophagogastroduodenoscopy, Care After Refer to this sheet in the next few weeks. These instructions provide you with information about caring for yourself after your procedure. Your health care provider may also give you more specific instructions. Your treatment has been planned according to current medical practices, but problems sometimes occur. Call your health care provider if you have any problems or questions after your procedure. WHAT TO EXPECT AFTER THE PROCEDURE After your procedure, it is typical to feel:  Soreness in your throat.  Pain with swallowing.  Sick to your stomach (nauseous).  Bloated.  Dizzy.  Fatigued. HOME CARE INSTRUCTIONS  Do not eat or drink anything until the numbing medicine (local anesthetic) has worn off and your gag reflex has returned. You will know that the local anesthetic has worn off when you can swallow comfortably.  Do not drive or operate machinery until directed by your health care provider.  Take medicines only as directed by your health care provider. SEEK MEDICAL CARE IF:   You cannot stop coughing.  You are not urinating at all or less than usual. SEEK IMMEDIATE MEDICAL CARE IF:  You have difficulty swallowing.  You cannot eat or drink.  You have worsening throat or chest pain.  You have dizziness or lightheadedness or you faint.  You have nausea or vomiting.  You have chills.  You have a fever.  You have severe abdominal pain.  You have black, tarry, or bloody stools.   This information is not intended to replace advice given to you by your health care provider. Make sure you  discuss any questions you have with your health care provider.   Document Released: 08/04/2012 Document Revised: 09/08/2014 Document Reviewed: 08/04/2012 Elsevier Interactive Patient Education [employer].

## 2015-08-24 NOTE — Assessment & Plan Note (Addendum)
2/2 gastric bypass re-do; reported failed dilatation; this morning pt had a stent placed prior removed because she has been having nausea and vomiting with it. GI Dr Watt Climes said she can eat fine after post op clear liquids today and full liquids to tomorrow and advance as tolerated. We discussed some dietary preferences with dietician will pick up in detail. I have already written for prostat BID, ensure with meals and ice cream between meals and that was satisfactory with pt and husband. Pt placed on carafate slurry 1 gm QID and already on nexiem 20 mg daily

## 2015-08-27 ENCOUNTER — Encounter (HOSPITAL_COMMUNITY): Payer: Self-pay | Admitting: Gastroenterology

## 2015-08-29 ENCOUNTER — Non-Acute Institutional Stay (SKILLED_NURSING_FACILITY): Payer: Medicare Other | Admitting: Internal Medicine

## 2015-08-29 DIAGNOSIS — R21 Rash and other nonspecific skin eruption: Secondary | ICD-10-CM | POA: Diagnosis not present

## 2015-08-29 DIAGNOSIS — R609 Edema, unspecified: Secondary | ICD-10-CM | POA: Diagnosis not present

## 2015-08-29 DIAGNOSIS — R600 Localized edema: Secondary | ICD-10-CM

## 2015-09-01 ENCOUNTER — Encounter: Payer: Self-pay | Admitting: Internal Medicine

## 2015-09-01 NOTE — Progress Notes (Addendum)
MRN: [id number] Name: Autumn Turner  Sex: female Age: 54 y.o. DOB: 06-Mar-1961  [place] #: [facility] Facility/Room: Level Of Care: SNF Provider: Inocencio Homes D Emergency Contacts: Extended Emergency Contact Information Primary Emergency Contact: Hoppel,Scott Address: [address]          [place], [place] [address] [place] Mobile Phone: [phone] Relation: Spouse  Code Status:   Allergies: Ciprocinonide; Ciprofloxacin; Dust mite extract; Iohexol; and Nsaids  Chief Complaint  Patient presents with  . Acute Visit    HPI: Patient is 54 y.o. female with past medical history of gastric bypass who was admitted to [facility] from 12/12-19 for acute respiratory failure 2/2 necrotizing MRSA PNA who is being seen acutely today for new rash onset after pt has started taking carafate. Nurses report rash and swelling to B hands and feet/ankles, involving palms onset 2 days ago  which was treated with benadryl and is better today. Pt denies itching or pain or prior rash. Pt has been on carafate prior, years ago.Pt had no SOB, dizzy or other systemic feature.  Past Medical History  Diagnosis Date  . Abnormal Pap smear   . Vulvar lesion   . CIN II (cervical intraepithelial neoplasia II)   . Malnourished ([place])   . Sleep apnea     Loss weight with gastric bypass  . Cancer ([place])     vulvar  . Anemia     blood transfusion  . Hypertension     no longer on medication  . Anxiety   . Depression   . History of stomach ulcers     Past Surgical History  Procedure Laterality Date  . Cesarean section    . Gastric bypass  2001  . Gallbladder surgery  2002  . Tummy tuck  2004  . Leep  06/2009  . Cholecystectomy    . Tonsillectomy      age 75 adnoids  . Vulvectomy Bilateral 12/07/2012    Procedure: VULVECTOMY AND BILATERAL INGUINAL LYMPH NODE DISSECTION, PAP SMEAR;  Surgeon: Janie Morning, MD;  Location: [facility] ORS;  Service: Gynecology;  Laterality: Bilateral;  . Revision to gastric  bypass    . Esophagogastroduodenoscopy N/A 06/29/2015    Procedure: ESOPHAGOGASTRODUODENOSCOPY (EGD);  Surgeon: Clarene Essex, MD;  Location: [facility] ENDOSCOPY;  Service: Endoscopy;  Laterality: N/A;  . Balloon dilation N/A 06/29/2015    Procedure: BALLOON DILATION;  Surgeon: Clarene Essex, MD;  Location: [facility] ENDOSCOPY;  Service: Endoscopy;  Laterality: N/A;  . Esophagogastroduodenoscopy (egd) with propofol N/A 07/17/2015    Procedure: ESOPHAGOGASTRODUODENOSCOPY (EGD) WITH PROPOFOL;  Surgeon: Clarene Essex, MD;  Location: [facility] ENDOSCOPY;  Service: Endoscopy;  Laterality: N/A;  . Balloon dilation N/A 07/17/2015    Procedure: BALLOON DILATION;  Surgeon: Clarene Essex, MD;  Location: [facility] ENDOSCOPY;  Service: Endoscopy;  Laterality: N/A;  . Esophageal stent placement N/A 07/17/2015    Procedure: ESOPHAGEAL STENT PLACEMENT;  Surgeon: Clarene Essex, MD;  Location: [facility] ENDOSCOPY;  Service: Endoscopy;  Laterality: N/A;  . Esophagogastroduodenoscopy N/A 08/24/2015    Procedure: ESOPHAGOGASTRODUODENOSCOPY (EGD);  Surgeon: Clarene Essex, MD;  Location: [facility] ENDOSCOPY;  Service: Endoscopy;  Laterality: N/A;      Medication List       This list is accurate as of: 08/29/15 11:59 PM.  Always use your most recent med list.               buPROPion 150 MG 24 hr tablet  Commonly known as:  WELLBUTRIN XL  Take 300 mg by mouth  daily.     cholecalciferol 1000 units tablet  Commonly known as:  VITAMIN D  Take 1,000 Units by mouth daily.     clidinium-chlordiazePOXIDE 5-2.5 MG capsule  Commonly known as:  LIBRAX  Take 1 capsule by mouth 4 (four) times daily -  before meals and at bedtime.     diphenhydrAMINE 12.5 MG chewable tablet  Commonly known as:  BENADRYL  Chew 12.5 mg by mouth 4 (four) times daily as needed for allergies.     esomeprazole 20 MG capsule  Commonly known as:  NEXIUM  Take 20 mg by mouth daily.     linezolid 600 MG tablet  Commonly known as:  ZYVOX  Take 1 tablet (600 mg total) by mouth every 12  (twelve) hours.     loratadine 10 MG tablet  Commonly known as:  CLARITIN  Take 10 mg by mouth daily as needed for allergies or rhinitis.     MULTIVITAMIN PO  Take 1 tablet by mouth daily.     oxycodone 5 MG capsule  Commonly known as:  OXY-IR  Take 5 mg by mouth every 6 (six) hours as needed.     protein supplement Powd  Commonly known as:  UNJURY CHICKEN SOUP  Take 7 g (2 oz total) by mouth daily.     traMADol 50 MG tablet  Commonly known as:  ULTRAM  TAKE 1 TABLET BY MOUTH THREE TIMES DAILY AS NEEDED     VISINE OP  Place 1-2 drops into both eyes as needed (for dry eyes).     vitamin B-12 1000 MCG tablet  Commonly known as:  CYANOCOBALAMIN  Take 1,000 mcg by mouth daily.        No orders of the defined types were placed in this encounter.     There is no immunization history on file for this patient.  Social History  Substance Use Topics  . Smoking status: Current Some Day Smoker -- 0.50 packs/day for 20 years    Types: Cigarettes  . Smokeless tobacco: Never Used  . Alcohol Use: No    Review of Systems  DATA OBTAINED: from patient, nurse- as in HPI GENERAL:  no fevers, fatigue, appetite changes SKIN: No itching, + rash improving HEENT: No complaint RESPIRATORY: No cough, wheezing, SOB CARDIAC: No chest pain, palpitations, lower extremity edema  GI: No abdominal pain, No N/V/D or constipation, No heartburn or reflux  GU: No dysuria, frequency or urgency, or incontinence  MUSCULOSKELETAL: No unrelieved bone/joint pain NEUROLOGIC: No headache, dizziness  PSYCHIATRIC: No overt anxiety or sadness  Filed Vitals:   09/01/15 2057  BP: 94/67  Pulse: 95  Temp: 96.9 F (36.1 C)  Resp: 18    Physical Exam  GENERAL APPEARANCE: Alert, conversant, No acute distress  SKIN: spidery non blanching rash ankles/feet and dorsum of B hands , a small amt on B palms, no swelling or heat HEENT: Unremarkable RESPIRATORY: Breathing is even, unlabored. Lung sounds are  clear   CARDIOVASCULAR: Heart RRR no murmurs, rubs or gallops. LUE peripheral edema from mid arm to hand, approx 3X larger than rUE GASTROINTESTINAL: Abdomen is soft, non-tender, not distended w/ normal bowel sounds.  GENITOURINARY: Bladder non tender, not distended  MUSCULOSKELETAL: No abnormal joints or musculature NEUROLOGIC: Cranial nerves 2-12 grossly intact. Moves all extremities PSYCHIATRIC: odd affect, no behavioral issues  Patient Active Problem List   Diagnosis Date Noted  . 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 Self induced vomiting 08/24/2015  . Narcotic drug use  08/24/2015  . Edema 08/23/2015  . Non compliance with medical treatment 08/22/2015  . Cognitive impairment 08/22/2015  . Depression 08/21/2015  . GERD (gastroesophageal reflux disease) 08/21/2015  . Vitamin D deficiency 08/21/2015  . Pressure ulcer 08/20/2015  . Protein-calorie malnutrition, severe 08/17/2015  . Pneumonia of right lung due to methicillin resistant Staphylococcus aureus (MRSA) ([place]) 08/17/2015  . Acute respiratory failure with hypoxia ([place]) 08/14/2015  . Necrotizing pneumonia ([place]) 08/14/2015  . Microcytic anemia 08/14/2015  . Sepsis ([place]) 08/14/2015  . Pleural effusion   . HCAP (healthcare-associated pneumonia) 08/13/2015  . Hypokalemia 08/13/2015  . Anemia 08/13/2015  . Abdominal pain 08/13/2015  . 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 Cellulitis of groin, right 12/16/2012  . Vulvar cancer ([place]) 11/18/2012    CBC    Component Value Date/Time   WBC 8.8 08/16/2015 0640   WBC 4.7 01/07/2015 1417   RBC 2.83* 08/16/2015 0640   RBC 3.59* 01/07/2015 1417   HGB 7.1* 08/16/2015 0640   HGB 9.0* 01/07/2015 1417   HCT 23.2* 08/16/2015 0640   HCT 29.6* 01/07/2015 1417   PLT 463* 08/16/2015 0640   MCV 82.0 08/16/2015 0640   MCV 82.4 01/07/2015 1417   LYMPHSABS 0.3* 08/16/2015 0640   MONOABS 0.4 08/16/2015 0640   EOSABS 0.1 08/16/2015 0640   BASOSABS 0.0 08/16/2015 0640    CMP      Component Value Date/Time   NA 137 08/18/2015 0339   K 4.2 08/18/2015 0339   CL 93* 08/18/2015 0339   CO2 38* 08/18/2015 0339   GLUCOSE 85 08/18/2015 0339   BUN <5* 08/18/2015 0339   CREATININE 0.33* 08/18/2015 0339   CREATININE 0.55 01/07/2015 1406   CALCIUM 8.5* 08/18/2015 0339   PROT 4.5* 08/16/2015 0640   ALBUMIN 1.1* 08/16/2015 0640   AST 15 08/16/2015 0640   ALT 9* 08/16/2015 0640   ALKPHOS 172* 08/16/2015 0640   BILITOT 0.7 08/16/2015 0640   GFRNONAA >60 08/18/2015 0339   GFRAA >60 08/18/2015 0339    Assessment and Plan  RASH AND NON SPECIFIC SKIN ERUPTION  - an unusual rash, not the usual allergic type of rash but onset temporaly tied to carafate; If rash has cleared next week will rechallenge with carafate if pt allows; carafate was started because pt had an ulcer where GE stent was removed; pt is on PPI  RUE EDEMA - swelling is so large that DVT needs to be considered; LUE U/S has been ordered  Hennie Duos, MD

## 2015-09-02 ENCOUNTER — Encounter: Payer: Self-pay | Admitting: Internal Medicine

## 2015-09-10 ENCOUNTER — Non-Acute Institutional Stay (SKILLED_NURSING_FACILITY): Payer: Medicare Other | Admitting: Internal Medicine

## 2015-09-10 ENCOUNTER — Encounter: Payer: Self-pay | Admitting: Internal Medicine

## 2015-09-10 DIAGNOSIS — R531 Weakness: Secondary | ICD-10-CM | POA: Insufficient documentation

## 2015-09-10 DIAGNOSIS — E43 Unspecified severe protein-calorie malnutrition: Secondary | ICD-10-CM

## 2015-09-10 DIAGNOSIS — K929 Disease of digestive system, unspecified: Secondary | ICD-10-CM

## 2015-09-10 DIAGNOSIS — K3189 Other diseases of stomach and duodenum: Secondary | ICD-10-CM

## 2015-09-10 NOTE — Progress Notes (Signed)
Patient ID: VENTURA OGBURN, female   DOB: 11-08-1960, 55 y.o.   MRN: [id number] MRN: [id number] Name: Autumn Turner  Sex: female Age: 55 y.o. DOB: 1960/11/26  [place] #: [facility] Facility/Room: Level Of Care: SNF Provider: Wille Celeste Emergency Contacts: Extended Emergency Contact Information Primary Emergency Contact: Callegari,Scott Address: [address]          [place], [place] [address] [place] [employer] Phone: [phone] Relation: Spouse Secondary Emergency Contact: Ronda Fairly [place] Mobile Phone: [phone] Relation: Daughter  Code Status:   Allergies: Carafate; Ciprocinonide; Ciprofloxacin; Dust mite extract; Iohexol; and Nsaids  Chief Complaint  Patient presents with  . Acute Visit   secondary to increased weakness and lethargy  HPI: Patient is 55 y.o. female with past medical history of gastric bypass who was admitted to [facility] from 12/12-19 for acute respiratory failure 2/2 necrotizing MRSA PNA --- Patient has a very complex medical history including gastric stricture with failed dilation-previously did have a stent but this was removed because of nausea and vomiting.  Her appetite apparently continues to be quite poor however she is on supplementation.  Therapy has noted increased weakness apparently during her therapy sessions and some lethargy.  Her vital signs actually appear to be stable but she continues to be quite weak and frail with her normal is issues as noted above.  Speaking with her husband this evening her appetite continues to be quite poor.  She is followed by Dr.Magod--for her GI issues and at some point if appetite does not improve consideration is being giving to TPN .  Past Medical History  Diagnosis Date  . Abnormal Pap smear   . Vulvar lesion   . CIN II (cervical intraepithelial neoplasia II)   . Malnourished ([place])   . Sleep apnea     Loss weight with gastric bypass  . Cancer ([place])     vulvar  . Anemia      blood transfusion  . Hypertension     no longer on medication  . Anxiety   . Depression   . History of stomach ulcers     Past Surgical History  Procedure Laterality Date  . Cesarean section  1987; 1989  . Gastric bypass open  2001  . Laparoscopic cholecystectomy  2002  . Abdominoplasty  2004    "tummy tuck"  . Leep  06/2009  . Cholecystectomy    . Tonsillectomy and adenoidectomy  1967  . Vulvectomy Bilateral 12/07/2012    Procedure: VULVECTOMY AND BILATERAL INGUINAL LYMPH NODE DISSECTION, PAP SMEAR;  Surgeon: Janie Morning, MD;  Location: [facility];  Service: Gynecology;  Laterality: Bilateral;  . Revision gastric restrictive procedure for morbid obesity    . Esophagogastroduodenoscopy N/A 06/29/2015    Procedure: ESOPHAGOGASTRODUODENOSCOPY (EGD);  Surgeon: Clarene Essex, MD;  Location: [facility] ENDOSCOPY;  Service: Endoscopy;  Laterality: N/A;  . Balloon dilation N/A 06/29/2015    Procedure: BALLOON DILATION;  Surgeon: Clarene Essex, MD;  Location: [facility] ENDOSCOPY;  Service: Endoscopy;  Laterality: N/A;  . Esophagogastroduodenoscopy (egd) with propofol N/A 07/17/2015    Procedure: ESOPHAGOGASTRODUODENOSCOPY (EGD) WITH PROPOFOL;  Surgeon: Clarene Essex, MD;  Location: [facility] ENDOSCOPY;  Service: Endoscopy;  Laterality: N/A;  . Balloon dilation N/A 07/17/2015    Procedure: BALLOON DILATION;  Surgeon: Clarene Essex, MD;  Location: [facility] ENDOSCOPY;  Service: Endoscopy;  Laterality: N/A;  . Esophageal stent placement N/A 07/17/2015    Procedure: ESOPHAGEAL STENT PLACEMENT;  Surgeon: Clarene Essex, MD;  Location: [facility]  ENDOSCOPY;  Service: Endoscopy;  Laterality: N/A;  . Esophagogastroduodenoscopy N/A 08/24/2015    Procedure: ESOPHAGOGASTRODUODENOSCOPY (EGD);  Surgeon: Clarene Essex, MD;  Location: [facility] ENDOSCOPY;  Service: Endoscopy;  Laterality: N/A;      Medication List       This list is accurate as of: 09/10/15 11:59 PM.  Always use your most recent med list.               buPROPion 150 MG 24 hr tablet   Commonly known as:  WELLBUTRIN XL  Take 300 mg by mouth daily.     cholecalciferol 1000 units tablet  Commonly known as:  VITAMIN D  Take 1,000 Units by mouth daily. Reported on 09/09/2015     clidinium-chlordiazePOXIDE 5-2.5 MG capsule  Commonly known as:  LIBRAX  Take 1 capsule by mouth 4 (four) times daily -  before meals and at bedtime.     diphenhydrAMINE 12.5 MG chewable tablet  Commonly known as:  BENADRYL  Chew 12.5 mg by mouth 4 (four) times daily as needed for allergies.     esomeprazole 20 MG capsule  Commonly known as:  NEXIUM  Take 20 mg by mouth daily.     loratadine 10 MG tablet  Commonly known as:  CLARITIN  Take 10 mg by mouth daily as needed for allergies or rhinitis.     MULTIVITAMIN PO  Take 1 tablet by mouth daily.     oxycodone 5 MG capsule  Commonly known as:  OXY-IR  Take 5 mg by mouth every 6 (six) hours as needed.     protein supplement Powd  Commonly known as:  UNJURY CHICKEN SOUP  Take 7 g (2 oz total) by mouth daily.     traMADol 50 MG tablet  Commonly known as:  ULTRAM  TAKE 1 TABLET BY MOUTH THREE TIMES DAILY AS NEEDED     VISINE OP  Place 1-2 drops into both eyes as needed (for dry eyes). Reported on 09/13/2015     vitamin B-12 1000 MCG tablet  Commonly known as:  CYANOCOBALAMIN  Inject 1,000 mcg into the skin daily.            Social History  Substance Use Topics  . Smoking status: Former Smoker -- 1.00 packs/day for 30 years    Types: Cigarettes    Quit date: 07/26/2015  . Smokeless tobacco: Never Used  . Alcohol Use: No    Review of Systems  DATA OBTAINED: from patient, nurse- as in HPI GENERAL:  no fevers, fatigue, appetite and 10 used to be poor SKIN: No itching, hx rashes HEENT: No complaint RESPIRATORY: No cough, wheezing, SOB CARDIAC: No chest pain, palpitations, lower extremity edema  GI: No abdominal pain, No N/V/D or constipation, No heartburn or reflux--have been episodes apparently of possible  self-induced vomiting in the past  GU: No dysuria, frequency or urgency, or incontinence  MUSCULOSKELETAL: No unrelieved bone/joint pain NEUROLOGIC: No headache, dizziness  PSYCHIATRIC: No overt anxiety or sadness  Filed Vitals:   09/10/15 1443  BP: 120/76  Pulse: 90  Temp: 98.1 F (36.7 C)  Resp: 16    Physical Exam  GENERAL APPEARANCE: Very frail-appearing is alert but not talking much, No acute distress  SKIN: \Warm and dry with somewhat of a petechial rash most noticeable lower extremities HEENT: Unremarkable RESPIRATORY: Breathing is even, unlabored. Lung sounds are clear with poor respiratory effort   CARDIOVASCULAR: Heart RRR no murmurs, rubs or gallops. LUE appears resolved GASTROINTESTINAL: Abdomen is  soft, non-tender, not distended w/ normal bowel sounds.  GENITOURINARY: Bladder non tender, not distended  MUSCULOSKELETAL: No abnormal joints or musculature--general weakness and frailty however NEUROLOGIC: Cranial nerves 2-12 grossly intact. Moves all extremities--no true lateralizing findings PSYCHIATRIC: odd affect, no behavioral issues  Patient Active Problem List   Diagnosis Date Noted  . Staphylococcus aureus bacteremia 09/14/2015  . Dehydration   . 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 Acute parotitis 09/13/2015  . ARF (acute renal failure) ([place]) 09/13/2015  . Failure to thrive in adult   . Bulimia nervosa 09/27/2015  . AKI (acute kidney injury) ([place]) 09/19/2015  . Sialadenitis 09/11/2015  . Hypoalbuminemia 09/11/2015  . Weakness 09/10/2015  . 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 Self induced vomiting 08/24/2015  . Narcotic drug use 08/24/2015  . Edema 08/23/2015  . Non compliance with medical treatment 08/22/2015  . Cognitive impairment 08/22/2015  . Depression 08/21/2015  . GERD (gastroesophageal reflux disease) 08/21/2015  . Vitamin D deficiency 08/21/2015  . Pressure ulcer 08/20/2015  . Protein-calorie malnutrition, severe  08/17/2015  . Pneumonia of right lung due to methicillin resistant Staphylococcus aureus (MRSA) ([place]) 08/17/2015  . Acute respiratory failure with hypoxia ([place]) 08/14/2015  . Necrotizing pneumonia ([place]) 08/14/2015  . Microcytic anemia 08/14/2015  . Sepsis ([place]) 08/14/2015  . Pleural effusion   . HCAP (healthcare-associated pneumonia) 08/13/2015  . Hypokalemia 08/13/2015  . Anemia 08/13/2015  . Abdominal pain 08/13/2015  . 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 Cellulitis of groin, right 12/16/2012  . 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  08/18/2015.  Sodium 137 potassium 4.2 BUN less than 3 creatinine 0.33.  Magnesium 1.9.  On 08/16/2015 albumin was 1.1L, phosphatase 172 ALT 9.  WBC 8.8 hemoglobin 7.1 platelets 463  CBC    Component Value Date/Time   WBC 15.0* 09/15/2015 0605   WBC 4.7 01/07/2015 1417   RBC 3.19* 09/15/2015 0605   RBC 3.59* 01/07/2015 1417   HGB 8.6* 09/15/2015 0605   HGB 9.0* 01/07/2015 1417   HCT 26.9* 09/15/2015 0605   HCT 29.6* 01/07/2015 1417   PLT 375 09/15/2015 0605   MCV 84.3 09/15/2015 0605   MCV 82.4 01/07/2015 1417   LYMPHSABS 0.9 09/13/2015 0308   MONOABS 0.6 09/13/2015 0308   EOSABS 0.0 09/13/2015 0308   BASOSABS 0.0 09/13/2015 0308    CMP     Component Value Date/Time   NA 135 09/15/2015 0605   K 3.5 09/15/2015 0605   CL 100* 09/15/2015 0605   CO2 24 09/15/2015 0605   GLUCOSE 54* 09/15/2015 0605   BUN 32* 09/15/2015 0605   CREATININE 1.58* 09/15/2015 0605   CREATININE 0.55 01/07/2015 1406   CALCIUM 8.2* 09/15/2015 0605   PROT 4.6* 09/15/2015 0605   ALBUMIN <1.0* 09/15/2015 0605   AST 21 09/15/2015 0605   ALT 13* 09/15/2015 0605   ALKPHOS 287* 09/15/2015 0605   BILITOT 1.0 09/15/2015 0605   GFRNONAA 36* 09/15/2015 0605   GFRAA 41* 09/15/2015 0605    Assessment and Plan  Gen. weakness-at times lethargy-patient continues to be a very fragile individual will update labs including a CBC CMP stat tomorrow morning-.  I  did speak fairly extensively with her husband at bedside as well and he continues to be concerned as well.  Patient is followed closely by Dr. Wilfrid Lund her husband intends to speak with him tomorrow as well as-I believe TPN has been considered at some point as noted above.      Anyela Napierkowski C,

## 2015-09-11 ENCOUNTER — Non-Acute Institutional Stay (SKILLED_NURSING_FACILITY): Payer: Medicare Other | Admitting: Internal Medicine

## 2015-09-11 ENCOUNTER — Encounter: Payer: Self-pay | Admitting: Internal Medicine

## 2015-09-11 DIAGNOSIS — E43 Unspecified severe protein-calorie malnutrition: Secondary | ICD-10-CM | POA: Diagnosis not present

## 2015-09-11 DIAGNOSIS — D509 Iron deficiency anemia, unspecified: Secondary | ICD-10-CM

## 2015-09-11 DIAGNOSIS — K1120 Sialoadenitis, unspecified: Secondary | ICD-10-CM | POA: Diagnosis not present

## 2015-09-11 DIAGNOSIS — E8809 Other disorders of plasma-protein metabolism, not elsewhere classified: Secondary | ICD-10-CM | POA: Insufficient documentation

## 2015-09-11 LAB — FUNGUS CULTURE W SMEAR: FUNGAL SMEAR: NONE SEEN

## 2015-09-11 NOTE — Assessment & Plan Note (Addendum)
D/c albumin mid December was 1.1; pt has been getting supplements and alb today < 1.5; pt with very poor appetite, officially today with FTT;

## 2015-09-11 NOTE — Assessment & Plan Note (Addendum)
L side angle of jaw; clinda 300 mg q 8 for 10 days;floraster 250 BID for 14 days

## 2015-09-11 NOTE — Assessment & Plan Note (Addendum)
D/c H was 7.1, Hb today 6.1; pt was given IV iron before hospital d/c and has had po iron since; no reports of bleeding or black stool; I really think pt is just not making new cells; pt is being set up for transfusion; I have some concern that Hb reading is incorrect; in the past we've has Hb 6.5 at SNF and  8 once in ED so we'll see; but we have to act on what we see; pt has an appt wit GI set up already

## 2015-09-11 NOTE — Progress Notes (Signed)
MRN: [id number] Name: DARLYS DEBRUYN  Sex: female Age: 55 y.o. DOB: 01-20-1961  [facility] #: [facility] Facility/Room:507 Level Of Care: SNF Provider: Inocencio Homes D Emergency Contacts: Extended Emergency Contact Information Primary Emergency Contact: Sydney,Scott Address: [address]          [place], [place] [address] [place] Mobile Phone: [phone] Relation: Spouse  Code Status:   Allergies: Ciprocinonide; Ciprofloxacin; Dust mite extract; Iohexol; and Nsaids  Chief Complaint  Patient presents with  . Acute Visit    HPI: Patient is 55 y.o. female who is being seen acutely for swelling to L face noted today. It is very tender. Worse with talking. Pt has had no fever. Nothing makes it better. Pt has generalized weakness,was seen yesterday for that and labs ordered which were abnormal and discussed below.  Past Medical History  Diagnosis Date  . Abnormal Pap smear   . Vulvar lesion   . CIN II (cervical intraepithelial neoplasia II)   . Malnourished ([place])   . Sleep apnea     Loss weight with gastric bypass  . Cancer ([place])     vulvar  . Anemia     blood transfusion  . Hypertension     no longer on medication  . Anxiety   . Depression   . History of stomach ulcers     Past Surgical History  Procedure Laterality Date  . Cesarean section    . Gastric bypass  2001  . Gallbladder surgery  2002  . Tummy tuck  2004  . Leep  06/2009  . Cholecystectomy    . Tonsillectomy      age 46 adnoids  . Vulvectomy Bilateral 12/07/2012    Procedure: VULVECTOMY AND BILATERAL INGUINAL LYMPH NODE DISSECTION, PAP SMEAR;  Surgeon: Janie Morning, MD;  Location: [facility];  Service: Gynecology;  Laterality: Bilateral;  . Revision to gastric bypass    . Esophagogastroduodenoscopy N/A 06/29/2015    Procedure: ESOPHAGOGASTRODUODENOSCOPY (EGD);  Surgeon: Clarene Essex, MD;  Location: [facility] ENDOSCOPY;  Service: Endoscopy;  Laterality: N/A;  . Balloon dilation N/A 06/29/2015    Procedure:  BALLOON DILATION;  Surgeon: Clarene Essex, MD;  Location: [facility] ENDOSCOPY;  Service: Endoscopy;  Laterality: N/A;  . Esophagogastroduodenoscopy (egd) with propofol N/A 07/17/2015    Procedure: ESOPHAGOGASTRODUODENOSCOPY (EGD) WITH PROPOFOL;  Surgeon: Clarene Essex, MD;  Location: [facility] ENDOSCOPY;  Service: Endoscopy;  Laterality: N/A;  . Balloon dilation N/A 07/17/2015    Procedure: BALLOON DILATION;  Surgeon: Clarene Essex, MD;  Location: [facility] ENDOSCOPY;  Service: Endoscopy;  Laterality: N/A;  . Esophageal stent placement N/A 07/17/2015    Procedure: ESOPHAGEAL STENT PLACEMENT;  Surgeon: Clarene Essex, MD;  Location: [facility] ENDOSCOPY;  Service: Endoscopy;  Laterality: N/A;  . Esophagogastroduodenoscopy N/A 08/24/2015    Procedure: ESOPHAGOGASTRODUODENOSCOPY (EGD);  Surgeon: Clarene Essex, MD;  Location: [facility] ENDOSCOPY;  Service: Endoscopy;  Laterality: N/A;      Medication List       This list is accurate as of: 09/11/15  9:50 PM.  Always use your most recent med list.               buPROPion 150 MG 24 hr tablet  Commonly known as:  WELLBUTRIN XL  Take 300 mg by mouth daily.     cholecalciferol 1000 units tablet  Commonly known as:  VITAMIN D  Take 1,000 Units by mouth daily.     clidinium-chlordiazePOXIDE 5-2.5 MG capsule  Commonly known as:  LIBRAX  Take 1 capsule by  mouth 4 (four) times daily -  before meals and at bedtime.     diphenhydrAMINE 12.5 MG chewable tablet  Commonly known as:  BENADRYL  Chew 12.5 mg by mouth 4 (four) times daily as needed for allergies.     esomeprazole 20 MG capsule  Commonly known as:  NEXIUM  Take 20 mg by mouth daily.     loratadine 10 MG tablet  Commonly known as:  CLARITIN  Take 10 mg by mouth daily as needed for allergies or rhinitis.     MULTIVITAMIN PO  Take 1 tablet by mouth daily.     oxycodone 5 MG capsule  Commonly known as:  OXY-IR  Take 5 mg by mouth every 6 (six) hours as needed.     protein supplement Powd  Commonly known as:  UNJURY CHICKEN  SOUP  Take 7 g (2 oz total) by mouth daily.     traMADol 50 MG tablet  Commonly known as:  ULTRAM  TAKE 1 TABLET BY MOUTH THREE TIMES DAILY AS NEEDED     VISINE OP  Place 1-2 drops into both eyes as needed (for dry eyes).     vitamin B-12 1000 MCG tablet  Commonly known as:  CYANOCOBALAMIN  Take 1,000 mcg by mouth daily.        No orders of the defined types were placed in this encounter.     There is no immunization history on file for this patient.  Social History  Substance Use Topics  . Smoking status: Current Some Day Smoker -- 0.50 packs/day for 20 years    Types: Cigarettes  . Smokeless tobacco: Never Used  . Alcohol Use: No    Review of Systems  DATA OBTAINED: from patient, nurse GENERAL:  no fevers,+ fatigue,  Dec appetite  SKIN: No itching, rash HEENT: L facial swelling ,very tender RESPIRATORY: No cough, wheezing, SOB CARDIAC: No chest pain, palpitations, lower extremity edema  GI: No abdominal pain, No N/V/D or constipation, No heartburn or reflux  GU: No dysuria, frequency or urgency, or incontinence  MUSCULOSKELETAL: No unrelieved bone/joint pain NEUROLOGIC: No headache, dizziness  PSYCHIATRIC: + anxiety,sadness  Filed Vitals:   09/11/15 1332  BP: 120/76  Pulse: 82  Temp: 97.6 F (36.4 C)  Resp: 16    Physical Exam  GENERAL APPEARANCE: Alert, min conversant, very weak appearing  SKIN: No diaphoresis rash, or wounds HEENT: very firm tender swelling ar angle of L jaw above and below; inside of mouth is palpated not tender RESPIRATORY: Breathing is even, unlabored. Lung sounds are clear   CARDIOVASCULAR: Heart RRR no murmurs, rubs or gallops. No peripheral edema  GASTROINTESTINAL: Abdomen is soft, non-tender, not distended w/ normal bowel sounds.  GENITOURINARY: Bladder non tender, not distended  MUSCULOSKELETAL: No abnormal joints or musculature NEUROLOGIC: Cranial nerves 2-12 grossly intact. Moves all extremities PSYCHIATRIC: seems  depressed, no behavioral issues  Patient Active Problem List   Diagnosis Date Noted  . Sialadenitis 09/11/2015  . Hypoalbuminemia 09/11/2015  . Weakness 09/10/2015  . 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 Self induced vomiting 08/24/2015  . Narcotic drug use 08/24/2015  . Edema 08/23/2015  . Non compliance with medical treatment 08/22/2015  . Cognitive impairment 08/22/2015  . Depression 08/21/2015  . GERD (gastroesophageal reflux disease) 08/21/2015  . Vitamin D deficiency 08/21/2015  . Pressure ulcer 08/20/2015  . Protein-calorie malnutrition, severe 08/17/2015  . Pneumonia of right lung due to methicillin resistant Staphylococcus aureus (MRSA) ([place]) 08/17/2015  . Acute respiratory failure  with hypoxia ([place]) 08/14/2015  . Necrotizing pneumonia ([place]) 08/14/2015  . Microcytic anemia 08/14/2015  . Sepsis ([place]) 08/14/2015  . Pleural effusion   . HCAP (healthcare-associated pneumonia) 08/13/2015  . Hypokalemia 08/13/2015  . Anemia 08/13/2015  . Abdominal pain 08/13/2015  . 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 Cellulitis of groin, right 12/16/2012  . 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    CBC    Component Value Date/Time   WBC 8.8 08/16/2015 0640   WBC 4.7 01/07/2015 1417   RBC 2.83* 08/16/2015 0640   RBC 3.59* 01/07/2015 1417   HGB 7.1* 08/16/2015 0640   HGB 9.0* 01/07/2015 1417   HCT 23.2* 08/16/2015 0640   HCT 29.6* 01/07/2015 1417   PLT 463* 08/16/2015 0640   MCV 82.0 08/16/2015 0640   MCV 82.4 01/07/2015 1417   LYMPHSABS 0.3* 08/16/2015 0640   MONOABS 0.4 08/16/2015 0640   EOSABS 0.1 08/16/2015 0640   BASOSABS 0.0 08/16/2015 0640    CMP     Component Value Date/Time   NA 137 08/18/2015 0339   K 4.2 08/18/2015 0339   CL 93* 08/18/2015 0339   CO2 38* 08/18/2015 0339   GLUCOSE 85 08/18/2015 0339   BUN <5* 08/18/2015 0339   CREATININE 0.33* 08/18/2015 0339   CREATININE 0.55 01/07/2015 1406   CALCIUM 8.5* 08/18/2015 0339   PROT 4.5* 08/16/2015 0640    ALBUMIN 1.1* 08/16/2015 0640   AST 15 08/16/2015 0640   ALT 9* 08/16/2015 0640   ALKPHOS 172* 08/16/2015 0640   BILITOT 0.7 08/16/2015 0640   GFRNONAA >60 08/18/2015 0339   GFRAA >60 08/18/2015 0339    Assessment and Plan  Sialadenitis L side angle of jaw; clinda 300 mg q 8 for 10 days;floraster 250 BID for 14 days  Anemia D/c H was 7.1, Hb today 6.1; pt was given IV iron before hospital d/c and has had po iron since; no reports of bleeding or black stool; I really think pt is just not making new cells; pt is being set up for transfusion; I have some concern that Hb reading is incorrect; in the past we've has Hb 6.5 at SNF and  8 once in ED so we'll see; but we have to act on what we see; pt has an appt wit GI set up already  Hypoalbuminemia D/c albumin mid December was 1.1; pt has been getting supplements and alb today < 1.5; pt with very poor appetite, officially today with FTT;  Protein-calorie malnutrition, severe Pt has been on protein supplements, but poor po intake plus seen self induced vomiting ; psych consult?   Time spent > 35 min;> 50% of time with patient was spent reviewing records, labs, tests and studies, counseling and developing plan of care  Hennie Duos, MD

## 2015-09-11 NOTE — Assessment & Plan Note (Signed)
Pt has been on protein supplements, but poor po intake plus seen self induced vomiting ; psych consult?

## 2015-09-12 ENCOUNTER — Telehealth: Payer: Self-pay | Admitting: *Deleted

## 2015-09-12 ENCOUNTER — Encounter (HOSPITAL_COMMUNITY): Payer: Self-pay | Admitting: *Deleted

## 2015-09-12 ENCOUNTER — Encounter: Payer: Self-pay | Admitting: Infectious Diseases

## 2015-09-12 ENCOUNTER — Ambulatory Visit (INDEPENDENT_AMBULATORY_CARE_PROVIDER_SITE_OTHER): Payer: Medicare Other | Admitting: Infectious Diseases

## 2015-09-12 ENCOUNTER — Non-Acute Institutional Stay (SKILLED_NURSING_FACILITY): Payer: Medicare Other | Admitting: Internal Medicine

## 2015-09-12 ENCOUNTER — Inpatient Hospital Stay (HOSPITAL_COMMUNITY)
Admission: EM | Admit: 2015-09-12 | Discharge: 2015-10-03 | DRG: 871 | Disposition: E | Payer: Medicare Other | Attending: Internal Medicine | Admitting: Internal Medicine

## 2015-09-12 ENCOUNTER — Emergency Department (HOSPITAL_COMMUNITY): Payer: Medicare Other

## 2015-09-12 VITALS — BP 108/74 | HR 102 | Temp 97.9°F

## 2015-09-12 DIAGNOSIS — Z7189 Other specified counseling: Secondary | ICD-10-CM | POA: Insufficient documentation

## 2015-09-12 DIAGNOSIS — F17210 Nicotine dependence, cigarettes, uncomplicated: Secondary | ICD-10-CM | POA: Diagnosis present

## 2015-09-12 DIAGNOSIS — K1121 Acute sialoadenitis: Secondary | ICD-10-CM | POA: Diagnosis present

## 2015-09-12 DIAGNOSIS — Z8701 Personal history of pneumonia (recurrent): Secondary | ICD-10-CM

## 2015-09-12 DIAGNOSIS — Z66 Do not resuscitate: Secondary | ICD-10-CM | POA: Diagnosis not present

## 2015-09-12 DIAGNOSIS — F329 Major depressive disorder, single episode, unspecified: Secondary | ICD-10-CM | POA: Diagnosis present

## 2015-09-12 DIAGNOSIS — B9561 Methicillin susceptible Staphylococcus aureus infection as the cause of diseases classified elsewhere: Secondary | ICD-10-CM | POA: Diagnosis present

## 2015-09-12 DIAGNOSIS — I340 Nonrheumatic mitral (valve) insufficiency: Secondary | ICD-10-CM | POA: Diagnosis not present

## 2015-09-12 DIAGNOSIS — B9562 Methicillin resistant Staphylococcus aureus infection as the cause of diseases classified elsewhere: Secondary | ICD-10-CM | POA: Diagnosis present

## 2015-09-12 DIAGNOSIS — D649 Anemia, unspecified: Secondary | ICD-10-CM | POA: Diagnosis present

## 2015-09-12 DIAGNOSIS — Z9884 Bariatric surgery status: Secondary | ICD-10-CM | POA: Diagnosis not present

## 2015-09-12 DIAGNOSIS — R64 Cachexia: Secondary | ICD-10-CM | POA: Diagnosis present

## 2015-09-12 DIAGNOSIS — Z91048 Other nonmedicinal substance allergy status: Secondary | ICD-10-CM

## 2015-09-12 DIAGNOSIS — R402412 Glasgow coma scale score 13-15, at arrival to emergency department: Secondary | ICD-10-CM | POA: Diagnosis present

## 2015-09-12 DIAGNOSIS — F502 Bulimia nervosa: Secondary | ICD-10-CM | POA: Diagnosis not present

## 2015-09-12 DIAGNOSIS — Z881 Allergy status to other antibiotic agents status: Secondary | ICD-10-CM | POA: Diagnosis not present

## 2015-09-12 DIAGNOSIS — Z87410 Personal history of cervical dysplasia: Secondary | ICD-10-CM

## 2015-09-12 DIAGNOSIS — F419 Anxiety disorder, unspecified: Secondary | ICD-10-CM | POA: Diagnosis present

## 2015-09-12 DIAGNOSIS — Y95 Nosocomial condition: Secondary | ICD-10-CM | POA: Diagnosis not present

## 2015-09-12 DIAGNOSIS — Z886 Allergy status to analgesic agent status: Secondary | ICD-10-CM

## 2015-09-12 DIAGNOSIS — K1120 Sialoadenitis, unspecified: Secondary | ICD-10-CM | POA: Diagnosis not present

## 2015-09-12 DIAGNOSIS — J9601 Acute respiratory failure with hypoxia: Secondary | ICD-10-CM | POA: Diagnosis not present

## 2015-09-12 DIAGNOSIS — E43 Unspecified severe protein-calorie malnutrition: Secondary | ICD-10-CM | POA: Diagnosis present

## 2015-09-12 DIAGNOSIS — M310 Hypersensitivity angiitis: Secondary | ICD-10-CM | POA: Diagnosis present

## 2015-09-12 DIAGNOSIS — R627 Adult failure to thrive: Secondary | ICD-10-CM

## 2015-09-12 DIAGNOSIS — R748 Abnormal levels of other serum enzymes: Secondary | ICD-10-CM | POA: Diagnosis present

## 2015-09-12 DIAGNOSIS — Z888 Allergy status to other drugs, medicaments and biological substances status: Secondary | ICD-10-CM

## 2015-09-12 DIAGNOSIS — Z79899 Other long term (current) drug therapy: Secondary | ICD-10-CM | POA: Diagnosis not present

## 2015-09-12 DIAGNOSIS — N179 Acute kidney failure, unspecified: Secondary | ICD-10-CM | POA: Diagnosis present

## 2015-09-12 DIAGNOSIS — A4902 Methicillin resistant Staphylococcus aureus infection, unspecified site: Secondary | ICD-10-CM | POA: Diagnosis not present

## 2015-09-12 DIAGNOSIS — R220 Localized swelling, mass and lump, head: Secondary | ICD-10-CM | POA: Diagnosis not present

## 2015-09-12 DIAGNOSIS — J189 Pneumonia, unspecified organism: Secondary | ICD-10-CM | POA: Diagnosis not present

## 2015-09-12 DIAGNOSIS — Z515 Encounter for palliative care: Secondary | ICD-10-CM | POA: Diagnosis not present

## 2015-09-12 DIAGNOSIS — B029 Zoster without complications: Secondary | ICD-10-CM | POA: Diagnosis present

## 2015-09-12 DIAGNOSIS — E860 Dehydration: Secondary | ICD-10-CM | POA: Diagnosis present

## 2015-09-12 DIAGNOSIS — R0902 Hypoxemia: Secondary | ICD-10-CM

## 2015-09-12 DIAGNOSIS — E876 Hypokalemia: Secondary | ICD-10-CM | POA: Diagnosis not present

## 2015-09-12 DIAGNOSIS — R112 Nausea with vomiting, unspecified: Secondary | ICD-10-CM | POA: Diagnosis not present

## 2015-09-12 DIAGNOSIS — R7881 Bacteremia: Secondary | ICD-10-CM | POA: Diagnosis present

## 2015-09-12 DIAGNOSIS — R233 Spontaneous ecchymoses: Secondary | ICD-10-CM | POA: Diagnosis present

## 2015-09-12 DIAGNOSIS — L8990 Pressure ulcer of unspecified site, unspecified stage: Secondary | ICD-10-CM | POA: Diagnosis not present

## 2015-09-12 DIAGNOSIS — R531 Weakness: Secondary | ICD-10-CM

## 2015-09-12 DIAGNOSIS — Z91041 Radiographic dye allergy status: Secondary | ICD-10-CM | POA: Diagnosis not present

## 2015-09-12 DIAGNOSIS — B965 Pseudomonas (aeruginosa) (mallei) (pseudomallei) as the cause of diseases classified elsewhere: Secondary | ICD-10-CM | POA: Diagnosis present

## 2015-09-12 DIAGNOSIS — R21 Rash and other nonspecific skin eruption: Secondary | ICD-10-CM | POA: Diagnosis not present

## 2015-09-12 DIAGNOSIS — R6884 Jaw pain: Secondary | ICD-10-CM | POA: Diagnosis present

## 2015-09-12 DIAGNOSIS — Z681 Body mass index (BMI) 19 or less, adult: Secondary | ICD-10-CM

## 2015-09-12 DIAGNOSIS — L959 Vasculitis limited to the skin, unspecified: Secondary | ICD-10-CM | POA: Diagnosis not present

## 2015-09-12 LAB — COMPREHENSIVE METABOLIC PANEL
ALT: 16 U/L (ref 14–54)
AST: 19 U/L (ref 15–41)
Albumin: 1.2 g/dL — ABNORMAL LOW (ref 3.5–5.0)
Alkaline Phosphatase: 368 U/L — ABNORMAL HIGH (ref 38–126)
Anion gap: 12 (ref 5–15)
BUN: 42 mg/dL — AB (ref 6–20)
CHLORIDE: 93 mmol/L — AB (ref 101–111)
CO2: 27 mmol/L (ref 22–32)
CREATININE: 1.59 mg/dL — AB (ref 0.44–1.00)
Calcium: 9.1 mg/dL (ref 8.9–10.3)
GFR calc Af Amer: 41 mL/min — ABNORMAL LOW (ref >60)
GFR, EST NON AFRICAN AMERICAN: 35 mL/min — AB (ref >60)
GLUCOSE: 65 mg/dL (ref 65–99)
POTASSIUM: 3.9 mmol/L (ref 3.5–5.1)
Sodium: 132 mmol/L — ABNORMAL LOW (ref 135–145)
Total Bilirubin: 0.5 mg/dL (ref 0.3–1.2)
Total Protein: 5.6 g/dL — ABNORMAL LOW (ref 6.5–8.1)

## 2015-09-12 LAB — CBC
HEMATOCRIT: 25.3 % — AB (ref 36.0–46.0)
Hemoglobin: 7.5 g/dL — ABNORMAL LOW (ref 12.0–15.0)
MCH: 24.7 pg — AB (ref 26.0–34.0)
MCHC: 29.6 g/dL — AB (ref 30.0–36.0)
MCV: 83.2 fL (ref 78.0–100.0)
Platelets: 459 10*3/uL — ABNORMAL HIGH (ref 150–400)
RBC: 3.04 MIL/uL — ABNORMAL LOW (ref 3.87–5.11)
RDW: 19.1 % — AB (ref 11.5–15.5)
WBC: 17.7 10*3/uL — AB (ref 4.0–10.5)

## 2015-09-12 LAB — URINALYSIS, ROUTINE W REFLEX MICROSCOPIC
GLUCOSE, UA: NEGATIVE mg/dL
KETONES UR: NEGATIVE mg/dL
Nitrite: NEGATIVE
PH: 5 (ref 5.0–8.0)
PROTEIN: 30 mg/dL — AB
Specific Gravity, Urine: 1.014 (ref 1.005–1.030)

## 2015-09-12 LAB — I-STAT CG4 LACTIC ACID, ED
Lactic Acid, Venous: 1.18 mmol/L (ref 0.5–2.0)
Lactic Acid, Venous: 1.63 mmol/L (ref 0.5–2.0)

## 2015-09-12 LAB — URINE MICROSCOPIC-ADD ON

## 2015-09-12 LAB — ABO/RH: ABO/RH(D): A POS

## 2015-09-12 LAB — CK: Total CK: 15 U/L — ABNORMAL LOW (ref 38–234)

## 2015-09-12 LAB — LIPASE, BLOOD: Lipase: 14 U/L (ref 11–51)

## 2015-09-12 LAB — POC OCCULT BLOOD, ED: FECAL OCCULT BLD: NEGATIVE

## 2015-09-12 MED ORDER — SODIUM CHLORIDE 0.9 % IV BOLUS (SEPSIS)
1000.0000 mL | Freq: Once | INTRAVENOUS | Status: AC
  Administered 2015-09-12: 1000 mL via INTRAVENOUS

## 2015-09-12 MED ORDER — SODIUM CHLORIDE 0.9 % IV SOLN
INTRAVENOUS | Status: DC
  Administered 2015-09-13: 01:00:00 via INTRAVENOUS

## 2015-09-12 MED ORDER — VANCOMYCIN HCL IN DEXTROSE 1-5 GM/200ML-% IV SOLN
1000.0000 mg | INTRAVENOUS | Status: AC
  Administered 2015-09-13: 1000 mg via INTRAVENOUS
  Filled 2015-09-12: qty 200

## 2015-09-12 MED ORDER — SODIUM CHLORIDE 0.9 % IV BOLUS (SEPSIS)
500.0000 mL | Freq: Once | INTRAVENOUS | Status: AC
  Administered 2015-09-13: 500 mL via INTRAVENOUS

## 2015-09-12 NOTE — ED Provider Notes (Signed)
noted to have reddish rash over legs. Seen by Dr. Johnnye Sima earlier today. Sent here for value for evaluation and admission. Patient also complains of painful swollen area over left mandible for the past 2 days. On exam she is chronically ill HEENT exam mucous membranes dry pale. She is has a swollen gallbladder sized area over the angle of the left mandible which is tender. No trismus in a petechial rash over lower extremities. Neurologic Glasgow Coma Score 15 moves all extremities well  Orlie Dakin, MD 09/07/2015 2226

## 2015-09-12 NOTE — Progress Notes (Signed)
   Subjective:    Patient ID: Autumn Turner, female    DOB: 1960-11-20, 55 y.o.   MRN: [id number]  HPI 55 yo F with hx of intractable n/v, gastric outlet obstruction, prev gastric bypass. Underwent ballon dilation 57-2016 but continued to have n/v.  She returns 12-12 with 1 week of cough, green sputum, fatigue, and fevers to 103. She was found to have cavitary PNA on CXR and CT. She was treated with vanco/zosyn and has improved. She was noted to have R effusion- this was tapped and showed >5000 WBC, Cx was negative. Cytology was (-). Her sputum Cx grew MRSA. She was treated with zyvox in hospital.  On d/c 12-19, she was changed to clindamycin. She has been in SNF and we were called that she had HgB of 6 at last draw and that she needs repeat HgB drawn.  today states she feels horrible.   feels like she has been getting weaker since d/c.  No further n/v, poor po intake because she doesn't want to throw up.  Has DOE when off her O2.  Has diffuse petechial rash since 1-7.  Review of Systems  Gastrointestinal: Negative for diarrhea and constipation.  Genitourinary: Negative for difficulty urinating.   Please see HPI. 12 point ROS o/w (-)     Objective:   Physical Exam  Constitutional: She appears lethargic. She appears cachectic. She appears toxic. She has a sickly appearance. She appears distressed.  HENT:  Mouth/Throat: No oropharyngeal exudate.  Dry mucous membranes  Eyes: EOM are normal. Pupils are equal, round, and reactive to light.  Neck: Neck supple.  Cardiovascular: Normal rate, regular rhythm and normal heart sounds.   Pulmonary/Chest: Effort normal and breath sounds normal.  Abdominal: Soft. Bowel sounds are normal. There is no tenderness. There is no rebound.  Musculoskeletal: She exhibits no edema.  Lymphadenopathy:    She has no cervical adenopathy.  Neurological: She appears lethargic.  Skin: Rash noted. Rash is maculopapular.      Assessment & Plan:

## 2015-09-12 NOTE — Telephone Encounter (Signed)
Dr. Johnnye Sima is sending the pt to the [facility] ED for admission.  Notified [facility], their transportation will take her to [facility] ED.  Driver from [place] for [facility] is transporting the pt to the ED.  Paperwork provided from this office visit.

## 2015-09-12 NOTE — Assessment & Plan Note (Addendum)
This pt is very ill.  She is being sent to ED for failure to thrive, probable severe anemia, possible drug reaction, dehydration.  Would stop her clinda.  Id will be available in hospital as needed.  Discussed with hospitalists

## 2015-09-12 NOTE — ED Notes (Signed)
Pt stays in NH, sent here from pcp office today due to anemia on blood work. Pt having fatigue, increase in generalized weakness and recent rash. Was recently in hospital for PNA. Per paperwork, pt sent here for possible anemia, failure to thrive and possible allergic reaction to clindamycin. Airway intact, spo2 100%.

## 2015-09-12 NOTE — ED Provider Notes (Signed)
CSN: [id number]     Arrival date & time 09/21/2015  69 History   First MD Initiated Contact with Patient 09/30/2015 2039     Chief Complaint  Patient presents with  . Failure To Thrive  . Fatigue    (Consider location/radiation/quality/duration/timing/severity/associated sxs/prior Treatment) HPI Comments: Patient with extensive surgical history stemming from previous gastric bypass surgery and subsequent obstruction, currently in rehabilitation facility after admission for pneumonia in 08/2015 -- presents from follow-up appointment today with increased fatigue, failure to thrive, petechial rash, poor oral intake, anemia. Patient was sent for admission. She has been on Clinda post hospitalization for pneumonia. Family reports that she has been not eating or drinking well at all. New area of swelling posterior to the left jaw noted. No reported fevers, vomiting, diarrhea. Patient has a history of decubitus ulcers. No urinary symptoms reported.  The history is provided by the patient, the spouse, a relative and medical records.    Past Medical History  Diagnosis Date  . Abnormal Pap smear   . Vulvar lesion   . CIN II (cervical intraepithelial neoplasia II)   . Malnourished ([place])   . Sleep apnea     Loss weight with gastric bypass  . Cancer ([place])     vulvar  . Anemia     blood transfusion  . Hypertension     no longer on medication  . Anxiety   . Depression   . History of stomach ulcers    Past Surgical History  Procedure Laterality Date  . Cesarean section    . Gastric bypass  2001  . Gallbladder surgery  2002  . Tummy tuck  2004  . Leep  06/2009  . Cholecystectomy    . Tonsillectomy      age 80 adnoids  . Vulvectomy Bilateral 12/07/2012    Procedure: VULVECTOMY AND BILATERAL INGUINAL LYMPH NODE DISSECTION, PAP SMEAR;  Surgeon: Janie Morning, MD;  Location: [facility];  Service: Gynecology;  Laterality: Bilateral;  . Revision to gastric bypass    . Esophagogastroduodenoscopy N/A  06/29/2015    Procedure: ESOPHAGOGASTRODUODENOSCOPY (EGD);  Surgeon: Clarene Essex, MD;  Location: [facility] ENDOSCOPY;  Service: Endoscopy;  Laterality: N/A;  . Balloon dilation N/A 06/29/2015    Procedure: BALLOON DILATION;  Surgeon: Clarene Essex, MD;  Location: [facility] ENDOSCOPY;  Service: Endoscopy;  Laterality: N/A;  . Esophagogastroduodenoscopy (egd) with propofol N/A 07/17/2015    Procedure: ESOPHAGOGASTRODUODENOSCOPY (EGD) WITH PROPOFOL;  Surgeon: Clarene Essex, MD;  Location: [facility] ENDOSCOPY;  Service: Endoscopy;  Laterality: N/A;  . Balloon dilation N/A 07/17/2015    Procedure: BALLOON DILATION;  Surgeon: Clarene Essex, MD;  Location: [facility] ENDOSCOPY;  Service: Endoscopy;  Laterality: N/A;  . Esophageal stent placement N/A 07/17/2015    Procedure: ESOPHAGEAL STENT PLACEMENT;  Surgeon: Clarene Essex, MD;  Location: [facility] ENDOSCOPY;  Service: Endoscopy;  Laterality: N/A;  . Esophagogastroduodenoscopy N/A 08/24/2015    Procedure: ESOPHAGOGASTRODUODENOSCOPY (EGD);  Surgeon: Clarene Essex, MD;  Location: [facility] ENDOSCOPY;  Service: Endoscopy;  Laterality: N/A;   Family History  Problem Relation Age of Onset  . Cancer Father   . Hypertension Father   . Lung cancer Father    Social History  Substance Use Topics  . Smoking status: Current Some Day Smoker -- 0.50 packs/day for 20 years    Types: Cigarettes  . Smokeless tobacco: Never Used  . Alcohol Use: No   OB History    No data available     Review of Systems  Constitutional: Positive for appetite  change. Negative for fever and chills.  HENT: Positive for facial swelling. Negative for rhinorrhea and sore throat.   Eyes: Negative for redness.  Respiratory: Negative for cough.   Cardiovascular: Negative for chest pain.  Gastrointestinal: Positive for nausea. Negative for vomiting, abdominal pain and diarrhea.  Genitourinary: Negative for dysuria.  Musculoskeletal: Negative for myalgias.  Skin: Positive for rash and wound.  Neurological: Negative for headaches.     Allergies  Carafate; Ciprocinonide; Ciprofloxacin; Dust mite extract; Iohexol; and Nsaids  Home Medications   Prior to Admission medications   Medication Sig Start Date End Date Taking? Authorizing Provider  buPROPion (WELLBUTRIN XL) 150 MG 24 hr tablet Take 300 mg by mouth daily.   Yes Historical Provider, MD  cholecalciferol (VITAMIN D) 1000 UNITS tablet Take 1,000 Units by mouth daily. Reported on 09/24/2015   Yes Historical Provider, MD  clidinium-chlordiazePOXIDE (LIBRAX) 5-2.5 MG capsule Take 1 capsule by mouth 4 (four) times daily -  before meals and at bedtime. 08/01/15  Yes Historical Provider, MD  clindamycin (CLEOCIN) 300 MG capsule Take 300 mg by mouth 3 (three) times daily.   Yes Historical Provider, MD  diphenhydrAMINE (BENADRYL) 12.5 MG chewable tablet Chew 12.5 mg by mouth 4 (four) times daily as needed for allergies.   Yes Historical Provider, MD  esomeprazole (NEXIUM) 20 MG capsule Take 20 mg by mouth daily.   Yes Historical Provider, MD  Multiple Vitamins-Minerals (MULTIVITAMIN PO) Take 1 tablet by mouth daily.    Yes Historical Provider, MD  saccharomyces boulardii (FLORASTOR) 250 MG capsule Take 250 mg by mouth 2 (two) times daily.   Yes Historical Provider, MD  traMADol (ULTRAM) 50 MG tablet TAKE 1 TABLET BY MOUTH THREE TIMES DAILY AS NEEDED 01/25/15  Yes Robyn Haber, MD  vitamin B-12 (CYANOCOBALAMIN) 1000 MCG tablet Take 1,000 mcg by mouth daily.   Yes Historical Provider, MD  Cyanocobalamin (VITAMIN B-12 IJ) Inject 1,000 mcg as directed once a week.    Historical Provider, MD  loratadine (CLARITIN) 10 MG tablet Take 10 mg by mouth daily as needed for allergies or rhinitis.     Historical Provider, MD  oxycodone (OXY-IR) 5 MG capsule Take 5 mg by mouth every 6 (six) hours as needed.    Historical Provider, MD  protein supplement (UNJURY CHICKEN SOUP) POWD Take 7 g (2 oz total) by mouth daily. Patient not taking: Reported on 09/10/2015 08/20/15   Charlynne Cousins,  MD  Tetrahydrozoline HCl (VISINE OP) Place 1-2 drops into both eyes as needed (for dry eyes). Reported on 09/28/2015    Historical Provider, MD   BP 112/74 mmHg  Pulse 94  Temp(Src) 97.5 F (36.4 C) (Oral)  Resp 18  SpO2 98%   Physical Exam  Constitutional: She appears well-nourished. She appears lethargic. She appears cachectic. She has a sickly appearance. She appears ill.  HENT:  Head: Atraumatic.  Mouth/Throat: Mucous membranes are dry.  Swelling, tenderness and redness noted to the area of the left parotid gland extending to the base of the ear.  Eyes: Conjunctivae are normal. Right eye exhibits no discharge. Left eye exhibits no discharge.  Neck: Normal range of motion. Neck supple.  Cardiovascular: Normal rate, regular rhythm and normal heart sounds.   Pulmonary/Chest: Effort normal and breath sounds normal. No respiratory distress. She has no wheezes. She has no rales.  Abdominal: Soft. There is tenderness (Generalized tenderness). There is no rebound and no guarding.  Neurological: She appears lethargic.  Skin: Skin is warm and dry.  Rash (petechial rash noted on the lower extremities) noted.  Several areas of decubitus ulcers, none appear particularly infected.  Psychiatric: She has a normal mood and affect.  Nursing note and vitals reviewed.   ED Course  Procedures (including critical care time) Labs Review Labs Reviewed  COMPREHENSIVE METABOLIC PANEL - Abnormal; Notable for the following:    Sodium 132 (*)    Chloride 93 (*)    BUN 42 (*)    Creatinine, Ser 1.59 (*)    Total Protein 5.6 (*)    Albumin 1.2 (*)    Alkaline Phosphatase 368 (*)    GFR calc non Af Amer 35 (*)    GFR calc Af Amer 41 (*)    All other components within normal limits  CBC - Abnormal; Notable for the following:    WBC 17.7 (*)    RBC 3.04 (*)    Hemoglobin 7.5 (*)    HCT 25.3 (*)    MCH 24.7 (*)    MCHC 29.6 (*)    RDW 19.1 (*)    Platelets 459 (*)    All other components within  normal limits  URINALYSIS, ROUTINE W REFLEX MICROSCOPIC (NOT AT [facility]) - Abnormal; Notable for the following:    Color, Urine AMBER (*)    APPearance CLOUDY (*)    Hgb urine dipstick LARGE (*)    Bilirubin Urine SMALL (*)    Protein, ur 30 (*)    Leukocytes, UA LARGE (*)    All other components within normal limits  CK - Abnormal; Notable for the following:    Total CK 15 (*)    All other components within normal limits  URINE MICROSCOPIC-ADD ON - Abnormal; Notable for the following:    Squamous Epithelial / LPF 0-5 (*)    Bacteria, UA FEW (*)    All other components within normal limits  CULTURE, BLOOD (ROUTINE X 2)  CULTURE, BLOOD (ROUTINE X 2)  URINE CULTURE  LIPASE, BLOOD  I-STAT CG4 LACTIC ACID, ED  I-STAT CG4 LACTIC ACID, ED  POC OCCULT BLOOD, ED  TYPE AND SCREEN  ABO/RH    Imaging Review Dg Chest Portable 1 View  09/26/2015  CLINICAL DATA:  55 year old female with previous pneumonia and weakness. Further to thrive. EXAM: PORTABLE CHEST 1 VIEW COMPARISON:  Radiograph dated 08/16/2015 FINDINGS: Single-view of the chest demonstrates emphysematous changes of the lungs. Stable appearing right upper lobe cavity again noted. There has been interval improvement of the previously seen airspace opacities bilaterally. Linear densities in the mid lung fields bilaterally likely represent atelectasis/scarring versus less likely residual infiltrate. There is a small right pleural effusion. No pneumothorax. The cardiac silhouette is within normal limits. The osseous structures appear unremarkable. IMPRESSION: Significant interval improvement of the previously seen bilateral airspace opacities. Residual mid lung field densities likely represent atelectasis/scarring versus residual infiltrate. Small right pleural effusion. Electronically Signed   By: Anner Crete M.D.   On: 09/18/2015 22:01   I have personally reviewed and evaluated these images and lab results as part of my medical  decision-making.   9:04 PM Patient seen and examined. Work-up initiated.   Vital signs reviewed and are as follows: BP 112/74 mmHg  Pulse 94  Temp(Src) 97.5 F (36.4 C) (Oral)  Resp 18  SpO2 98%  Patient discussed with and seen by Dr. Winfred Leeds. Will need admission for multiple medical problems.   Spoke with Dr. Hal Hope who will see.   Regarding facial swelling -- patient does have history  of sialadenitis. Question parotitis versus cutaneous abscess. Discussed with radiologist regarding testing. Recommends noncontrasted CT now given acute kidney injury versus CT with contrast after hydration and improvement in creatinine.    MDM   Final diagnoses:  Acute kidney injury ([place])  Dehydration  Petechiae  Failure to thrive in adult   Admit.    Carlisle Cater, PA-C 09/14/2015 [id number]  Orlie Dakin, MD 09/13/15 [phone]

## 2015-09-12 NOTE — Telephone Encounter (Signed)
Dr. Sheppard Coil is requesting STAT CBC be draw at the pt's OV today, 09/19/2015.  Pt had CBC at facility 09/11/15 was 6.1.  MD at facility doesn't think that this is accurate.  MD wanted Dr. 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  Please see notes in EPIC.

## 2015-09-12 NOTE — Progress Notes (Signed)
MRN: [id number] Name: Autumn Turner  Sex: female Age: 55 y.o. DOB: 07/11/1961  [facility] #: [facility] Facility/Room: Level Of Care: SNF Provider: Merrilee Seashore D Emergency Contacts: Extended Emergency Contact Information Primary Emergency Contact: Mcelhinney,Scott Address: [address]          [place], [place] [address] [place] Phone: [phone] Relation: Spouse Secondary Emergency Contact: Sarajane Marek [place] of [place] Mobile Phone: [phone] Relation: Daughter  Code Status:   Allergies: Carafate; Ciprocinonide; Ciprofloxacin; Dust mite extract; Iohexol; and Nsaids  Chief Complaint  Patient presents with  . family conference  . Acute Visit    HPI: Patient is 55 y.o. female with past medical history of gastric bypass who was admitted to [facility] from 12/12-19 for acute respiratory failure 2/2 necrotizing MRSA PNA who is being seen acutely today for a family conference. Pt's son and daughter are here and we met for a long discussion about Mom as is outlined below. Also further work needs to be done about pt's Hb of 6.1 as plans made by SNF for her today fell through.  Past Medical History  Diagnosis Date  . Abnormal Pap smear   . Vulvar lesion   . CIN II (cervical intraepithelial neoplasia II)   . Malnourished (HCC)   . Sleep apnea     Loss weight with gastric bypass  . Cancer (HCC)     vulvar  . Anemia     blood transfusion  . Hypertension     no longer on medication  . Anxiety   . Depression   . History of stomach ulcers     Past Surgical History  Procedure Laterality Date  . Cesarean section  1987; 1989  . Gastric bypass open  2001  . Laparoscopic cholecystectomy  2002  . Abdominoplasty  2004    "tummy tuck"  . Leep  06/2009  . Cholecystectomy    . Tonsillectomy and adenoidectomy  1967  . Vulvectomy Bilateral 12/07/2012    Procedure: VULVECTOMY AND BILATERAL INGUINAL LYMPH NODE DISSECTION, PAP SMEAR;  Surgeon: Laurette Schimke, MD;   Location: [facility] ORS;  Service: Gynecology;  Laterality: Bilateral;  . Revision gastric restrictive procedure for morbid obesity    . Esophagogastroduodenoscopy N/A 06/29/2015    Procedure: ESOPHAGOGASTRODUODENOSCOPY (EGD);  Surgeon: Vida Rigger, MD;  Location: [facility] ENDOSCOPY;  Service: Endoscopy;  Laterality: N/A;  . Balloon dilation N/A 06/29/2015    Procedure: BALLOON DILATION;  Surgeon: Vida Rigger, MD;  Location: [facility] ENDOSCOPY;  Service: Endoscopy;  Laterality: N/A;  . Esophagogastroduodenoscopy (egd) with propofol N/A 07/17/2015    Procedure: ESOPHAGOGASTRODUODENOSCOPY (EGD) WITH PROPOFOL;  Surgeon: Vida Rigger, MD;  Location: [facility] ENDOSCOPY;  Service: Endoscopy;  Laterality: N/A;  . Balloon dilation N/A 07/17/2015    Procedure: BALLOON DILATION;  Surgeon: Vida Rigger, MD;  Location: [facility] ENDOSCOPY;  Service: Endoscopy;  Laterality: N/A;  . Esophageal stent placement N/A 07/17/2015    Procedure: ESOPHAGEAL STENT PLACEMENT;  Surgeon: Vida Rigger, MD;  Location: [facility] ENDOSCOPY;  Service: Endoscopy;  Laterality: N/A;  . Esophagogastroduodenoscopy N/A 08/24/2015    Procedure: ESOPHAGOGASTRODUODENOSCOPY (EGD);  Surgeon: Vida Rigger, MD;  Location: [facility] ENDOSCOPY;  Service: Endoscopy;  Laterality: N/A;      Medication List    Notice    This visit is on the same day as an admission, and a visit start time could not be determined. If the visit took place after discharge, manually review the med list with the patient.     Current Outpatient  Prescriptions on File Prior to Visit  Medication Sig Dispense Refill  . buPROPion (WELLBUTRIN XL) 150 MG 24 hr tablet Take 300 mg by mouth daily.    . cholecalciferol (VITAMIN D) 1000 UNITS tablet Take 1,000 Units by mouth daily. Reported on 09/24/2015    . clidinium-chlordiazePOXIDE (LIBRAX) 5-2.5 MG capsule Take 1 capsule by mouth 4 (four) times daily -  before meals and at bedtime.  3  . diphenhydrAMINE (BENADRYL) 12.5 MG chewable tablet Chew 12.5 mg by mouth 4 (four) times  daily as needed for allergies.    Marland Kitchen esomeprazole (NEXIUM) 20 MG capsule Take 20 mg by mouth daily.    Marland Kitchen loratadine (CLARITIN) 10 MG tablet Take 10 mg by mouth daily as needed for allergies or rhinitis.     . Multiple Vitamins-Minerals (MULTIVITAMIN PO) Take 1 tablet by mouth daily.     Marland Kitchen oxycodone (OXY-IR) 5 MG capsule Take 5 mg by mouth every 6 (six) hours as needed.    . protein supplement (UNJURY CHICKEN SOUP) POWD Take 7 g (2 oz total) by mouth daily. (Patient not taking: Reported on 09/09/2015) 30 g 0  . Tetrahydrozoline HCl (VISINE OP) Place 1-2 drops into both eyes as needed (for dry eyes). Reported on 09/24/2015    . traMADol (ULTRAM) 50 MG tablet TAKE 1 TABLET BY MOUTH THREE TIMES DAILY AS NEEDED 60 tablet 0   No current facility-administered medications on file prior to visit.     No orders of the defined types were placed in this encounter.     There is no immunization history on file for this patient.  Social History  Substance Use Topics  . Smoking status: Former Smoker -- 1.00 packs/day for 30 years    Types: Cigarettes    Quit date: 07/26/2015  . Smokeless tobacco: Never Used  . Alcohol Use: No    Review of Systems  DATA OBTAINED: from patient, nurse, son and daughter GENERAL:  no fevers, +fatigue, poor appetite  SKIN: No itching, rash HEENT: swelling L jaw with heat warmth, TTP as per yesterday RESPIRATORY: No cough, wheezing, SOB CARDIAC: No chest pain, palpitations, lower extremity edema  GI: No abdominal pain, No N/V/D or constipation, No heartburn or reflux  GU: No dysuria, frequency or urgency, or incontinence  MUSCULOSKELETAL: No unrelieved bone/joint pain NEUROLOGIC: No headache, dizziness  PSYCHIATRIC: distorted body image and beliefs  Filed Vitals:   09/29/15 2028  BP: 112/74  Pulse: 82  Temp: 97.6 F (36.4 C)  Resp: 16    Physical Exam  GENERAL APPEARANCE: Alert, mod conversant, No acute distress; sitting in WC, just back from appt and  going out again this afternoon SKIN: pallor, rash on legs HEENT:swelling, heat TTP L jaw as yesterday RESPIRATORY: Breathing is even, unlabored. Lung sounds are clear   CARDIOVASCULAR: Heart RRR no murmurs, rubs or gallops. No peripheral edema  GASTROINTESTINAL: Abdomen is soft, mild diffuse tender as always, not distended w/ normal bowel sounds.  GENITOURINARY: Bladder non tender, not distended  MUSCULOSKELETAL: No abnormal joints or musculature NEUROLOGIC: Cranial nerves 2-12 grossly intact. Moves all extremities PSYCHIATRIC: flat,depressed no behavioral issues  Patient Active Problem List   Diagnosis Date Noted  . Encounter for palliative care   . Jaw pain   . Goals of care, counseling/discussion   . Staphylococcus aureus bacteremia 09/14/2015  . Dehydration   . 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 Acute parotitis 09/13/2015  . ARF (acute renal failure) ([place]) 09/13/2015  . Failure to thrive in  adult   . Bulimia nervosa 09/06/2015  . AKI (acute kidney injury) ([place]) 09/19/2015  . Sialadenitis 09/11/2015  . Hypoalbuminemia 09/11/2015  . Weakness 09/10/2015  . 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 Self induced vomiting 08/24/2015  . Narcotic drug use 08/24/2015  . Edema 08/23/2015  . Non compliance with medical treatment 08/22/2015  . Cognitive impairment 08/22/2015  . Depression 08/21/2015  . GERD (gastroesophageal reflux disease) 08/21/2015  . Vitamin D deficiency 08/21/2015  . Pressure ulcer 08/20/2015  . Protein-calorie malnutrition, severe 08/17/2015  . Pneumonia of right lung due to methicillin resistant Staphylococcus aureus (MRSA) ([place]) 08/17/2015  . Acute respiratory failure with hypoxia ([place]) 08/14/2015  . Necrotizing pneumonia ([place]) 08/14/2015  . Microcytic anemia 08/14/2015  . Sepsis ([place]) 08/14/2015  . Pleural effusion   . HCAP (healthcare-associated pneumonia) 08/13/2015  . Hypokalemia 08/13/2015  . Anemia 08/13/2015  . Abdominal pain  08/13/2015  . 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 Cellulitis of groin, right 12/16/2012  . Vulvar cancer ([place]) 11/18/2012    CBC    Component Value Date/Time   WBC 24.8* 09-23-2015 0459   WBC 4.7 01/07/2015 1417   RBC 3.42* 2015-09-23 0459   RBC 3.59* 01/07/2015 1417   HGB 9.4* September 23, 2015 0459   HGB 9.0* 01/07/2015 1417   HCT 30.0* 2015/09/23 0459   HCT 29.6* 01/07/2015 1417   PLT 343 09/23/2015 0459   MCV 87.7 23-Sep-2015 0459   MCV 82.4 01/07/2015 1417   LYMPHSABS 1.4 09-23-2015 0459   MONOABS 0.8 Sep 23, 2015 0459   EOSABS 0.0 09-23-2015 0459   BASOSABS 0.0 09/23/2015 0459    CMP     Component Value Date/Time   NA 144 September 23, 2015 0459   K 3.7 2015-09-23 0459   CL 116* 09/23/2015 0459   CO2 20* 09/23/15 0459   GLUCOSE 152* 09-23-15 0459   BUN 21* 09/23/2015 0459   CREATININE 1.62* 23-Sep-2015 0459   CREATININE 0.55 01/07/2015 1406   CALCIUM 8.2* 23-Sep-2015 0459   PROT 5.1* September 23, 2015 0459   ALBUMIN <1.0* 09-23-15 0459   AST 16 09-23-15 0459   ALT 12* 09/23/15 0459   ALKPHOS 247* Sep 23, 2015 0459   BILITOT 0.4 09/23/15 0459   GFRNONAA 34* 09/23/2015 0459   GFRAA 40* 2015-09-23 0459    Assessment and Plan FAMILY ENCOUNTER- spoke of many issues but mainly of FTT. Daughter said that pt had been this way for months, if not years. She has been making herself vomit after eating for years. This made sense and matched with reports from staff of their suspicions that pt was making herself vomit. Daughter said that her mother didn't eat because she WANTED a J tube so she could throw up and not lose weight. She reported that her mother had blamed all her problems on her weight, then the stomach surgery for years. No amount of discussion and reality could change her mind, in her mind, she didn't have a problem, so counseling for bulemia was not an option as she would not admit to the problem.. They were at there wits end for what to do about it. We discussed that a J tube  was not going to solve the problems, (and would make her prone to diarrhea), as they were mental, and both understood this completely. We were unable to come to a solution but this information was crucial. If she survives the current physical situation she will need inpt psych in all probability. 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  ANEMIA -  Hb 6.1 yesterday, plans for tx fell through today. Pt has ID appt this afternoon. We need a reliable Hb,to be sure she actually needs a transfusion I don't trust the SNF lab. I spoke with Dr Shana Chute nurse , explained the problem and asked if they could get a stat CBC at their office. I also made her aware of pt's new dx of bulemia.  Time spent > 60 min;> 50% of time with patient was spent reviewing records, labs, tests and studies, counseling and developing plan of care  Hennie Duos, MD

## 2015-09-13 ENCOUNTER — Encounter (HOSPITAL_COMMUNITY): Payer: Self-pay | Admitting: Internal Medicine

## 2015-09-13 DIAGNOSIS — M310 Hypersensitivity angiitis: Secondary | ICD-10-CM | POA: Diagnosis present

## 2015-09-13 DIAGNOSIS — K1121 Acute sialoadenitis: Secondary | ICD-10-CM | POA: Diagnosis present

## 2015-09-13 DIAGNOSIS — E860 Dehydration: Secondary | ICD-10-CM

## 2015-09-13 DIAGNOSIS — R21 Rash and other nonspecific skin eruption: Secondary | ICD-10-CM

## 2015-09-13 DIAGNOSIS — Z8614 Personal history of Methicillin resistant Staphylococcus aureus infection: Secondary | ICD-10-CM

## 2015-09-13 DIAGNOSIS — R220 Localized swelling, mass and lump, head: Secondary | ICD-10-CM

## 2015-09-13 DIAGNOSIS — L89329 Pressure ulcer of left buttock, unspecified stage: Secondary | ICD-10-CM

## 2015-09-13 DIAGNOSIS — E43 Unspecified severe protein-calorie malnutrition: Secondary | ICD-10-CM

## 2015-09-13 DIAGNOSIS — N179 Acute kidney failure, unspecified: Secondary | ICD-10-CM | POA: Diagnosis present

## 2015-09-13 DIAGNOSIS — R627 Adult failure to thrive: Secondary | ICD-10-CM

## 2015-09-13 LAB — COMPREHENSIVE METABOLIC PANEL
ALK PHOS: 291 U/L — AB (ref 38–126)
ALT: 12 U/L — ABNORMAL LOW (ref 14–54)
ANION GAP: 9 (ref 5–15)
AST: 15 U/L (ref 15–41)
BILIRUBIN TOTAL: 0.5 mg/dL (ref 0.3–1.2)
BUN: 40 mg/dL — AB (ref 6–20)
CALCIUM: 7.9 mg/dL — AB (ref 8.9–10.3)
CO2: 25 mmol/L (ref 22–32)
Chloride: 99 mmol/L — ABNORMAL LOW (ref 101–111)
Creatinine, Ser: 1.62 mg/dL — ABNORMAL HIGH (ref 0.44–1.00)
GFR calc Af Amer: 40 mL/min — ABNORMAL LOW (ref >60)
GFR calc non Af Amer: 34 mL/min — ABNORMAL LOW (ref >60)
GLUCOSE: 63 mg/dL — AB (ref 65–99)
POTASSIUM: 3.4 mmol/L — AB (ref 3.5–5.1)
SODIUM: 133 mmol/L — AB (ref 135–145)
Total Protein: 4.4 g/dL — ABNORMAL LOW (ref 6.5–8.1)

## 2015-09-13 LAB — CBC WITH DIFFERENTIAL/PLATELET
Basophils Absolute: 0 10*3/uL (ref 0.0–0.1)
Basophils Relative: 0 %
EOS PCT: 0 %
Eosinophils Absolute: 0 10*3/uL (ref 0.0–0.7)
HEMATOCRIT: 20.2 % — AB (ref 36.0–46.0)
Hemoglobin: 6.1 g/dL — CL (ref 12.0–15.0)
LYMPHS PCT: 5 %
Lymphs Abs: 0.9 10*3/uL (ref 0.7–4.0)
MCH: 25 pg — ABNORMAL LOW (ref 26.0–34.0)
MCHC: 30.2 g/dL (ref 30.0–36.0)
MCV: 82.8 fL (ref 78.0–100.0)
MONO ABS: 0.6 10*3/uL (ref 0.1–1.0)
MONOS PCT: 3 %
NEUTROS ABS: 15.8 10*3/uL — AB (ref 1.7–7.7)
Neutrophils Relative %: 91 %
PLATELETS: 437 10*3/uL — AB (ref 150–400)
RBC: 2.44 MIL/uL — ABNORMAL LOW (ref 3.87–5.11)
RDW: 19.1 % — AB (ref 11.5–15.5)
WBC: 17.3 10*3/uL — ABNORMAL HIGH (ref 4.0–10.5)

## 2015-09-13 LAB — MRSA PCR SCREENING: MRSA by PCR: POSITIVE — AB

## 2015-09-13 LAB — PREPARE RBC (CROSSMATCH)

## 2015-09-13 LAB — LACTATE DEHYDROGENASE: LDH: 202 U/L — ABNORMAL HIGH (ref 98–192)

## 2015-09-13 MED ORDER — OXYCODONE HCL 5 MG PO TABS
5.0000 mg | ORAL_TABLET | Freq: Four times a day (QID) | ORAL | Status: DC | PRN
  Administered 2015-09-13 – 2015-09-16 (×9): 5 mg via ORAL
  Filled 2015-09-13 (×9): qty 1

## 2015-09-13 MED ORDER — ACETAMINOPHEN 650 MG RE SUPP
650.0000 mg | Freq: Four times a day (QID) | RECTAL | Status: DC | PRN
Start: 2015-09-13 — End: 2015-09-20

## 2015-09-13 MED ORDER — SODIUM CHLORIDE 0.9 % IV SOLN
Freq: Once | INTRAVENOUS | Status: AC
  Administered 2015-09-13: 08:00:00 via INTRAVENOUS

## 2015-09-13 MED ORDER — PIPERACILLIN-TAZOBACTAM 3.375 G IVPB
3.3750 g | Freq: Three times a day (TID) | INTRAVENOUS | Status: DC
  Administered 2015-09-14 – 2015-09-16 (×7): 3.375 g via INTRAVENOUS
  Filled 2015-09-13 (×12): qty 50

## 2015-09-13 MED ORDER — BUPROPION HCL ER (XL) 300 MG PO TB24
300.0000 mg | ORAL_TABLET | Freq: Every day | ORAL | Status: DC
  Administered 2015-09-14 – 2015-09-19 (×6): 300 mg via ORAL
  Filled 2015-09-13 (×8): qty 1

## 2015-09-13 MED ORDER — PANTOPRAZOLE SODIUM 40 MG PO TBEC
40.0000 mg | DELAYED_RELEASE_TABLET | Freq: Every day | ORAL | Status: DC
  Administered 2015-09-14 – 2015-09-19 (×5): 40 mg via ORAL
  Filled 2015-09-13 (×6): qty 1

## 2015-09-13 MED ORDER — SODIUM CHLORIDE 0.9 % IV SOLN
500.0000 mg | INTRAVENOUS | Status: DC
  Administered 2015-09-13 – 2015-09-19 (×7): 500 mg via INTRAVENOUS
  Filled 2015-09-13 (×10): qty 500

## 2015-09-13 MED ORDER — VITAMIN D 1000 UNITS PO TABS
1000.0000 [IU] | ORAL_TABLET | Freq: Every day | ORAL | Status: DC
  Administered 2015-09-14 – 2015-09-16 (×3): 1000 [IU] via ORAL
  Filled 2015-09-13 (×3): qty 1

## 2015-09-13 MED ORDER — FENTANYL CITRATE (PF) 100 MCG/2ML IJ SOLN
50.0000 ug | Freq: Once | INTRAMUSCULAR | Status: AC
  Administered 2015-09-13: 50 ug via INTRAVENOUS
  Filled 2015-09-13: qty 2

## 2015-09-13 MED ORDER — SACCHAROMYCES BOULARDII 250 MG PO CAPS
250.0000 mg | ORAL_CAPSULE | Freq: Two times a day (BID) | ORAL | Status: DC
  Administered 2015-09-13 – 2015-09-17 (×8): 250 mg via ORAL
  Filled 2015-09-13 (×11): qty 1

## 2015-09-13 MED ORDER — METHYLPREDNISOLONE SODIUM SUCC 40 MG IJ SOLR
40.0000 mg | Freq: Once | INTRAMUSCULAR | Status: AC
  Administered 2015-09-13: 40 mg via INTRAVENOUS
  Filled 2015-09-13: qty 1

## 2015-09-13 MED ORDER — DEXTROSE 5 % IV SOLN
250.0000 mg | Freq: Two times a day (BID) | INTRAVENOUS | Status: DC
  Administered 2015-09-13 – 2015-09-14 (×2): 250 mg via INTRAVENOUS
  Filled 2015-09-13 (×4): qty 5

## 2015-09-13 MED ORDER — BOOST / RESOURCE BREEZE PO LIQD
1.0000 | Freq: Three times a day (TID) | ORAL | Status: DC
  Administered 2015-09-14 (×3): 1 via ORAL

## 2015-09-13 MED ORDER — ENSURE ENLIVE PO LIQD
237.0000 mL | Freq: Two times a day (BID) | ORAL | Status: DC
  Administered 2015-09-15 – 2015-09-19 (×7): 237 mL via ORAL

## 2015-09-13 MED ORDER — SODIUM CHLORIDE 0.9 % IV SOLN
INTRAVENOUS | Status: AC
  Administered 2015-09-13: 07:00:00 via INTRAVENOUS

## 2015-09-13 MED ORDER — VITAMIN B-12 1000 MCG PO TABS
1000.0000 ug | ORAL_TABLET | Freq: Every day | ORAL | Status: DC
Start: 2015-09-13 — End: 2015-09-20
  Administered 2015-09-14 – 2015-09-16 (×3): 1000 ug via ORAL
  Filled 2015-09-13 (×7): qty 1

## 2015-09-13 MED ORDER — ACETAMINOPHEN 325 MG PO TABS
650.0000 mg | ORAL_TABLET | Freq: Four times a day (QID) | ORAL | Status: DC | PRN
Start: 2015-09-13 — End: 2015-09-20

## 2015-09-13 MED ORDER — PIPERACILLIN-TAZOBACTAM 3.375 G IVPB
3.3750 g | Freq: Three times a day (TID) | INTRAVENOUS | Status: DC
  Administered 2015-09-13 (×2): 3.375 g via INTRAVENOUS
  Filled 2015-09-13 (×5): qty 50

## 2015-09-13 MED ORDER — CHLORHEXIDINE GLUCONATE CLOTH 2 % EX PADS
6.0000 | MEDICATED_PAD | Freq: Every day | CUTANEOUS | Status: AC
Start: 2015-09-14 — End: 2015-09-19
  Administered 2015-09-14 – 2015-09-18 (×2): 6 via TOPICAL

## 2015-09-13 MED ORDER — CETYLPYRIDINIUM CHLORIDE 0.05 % MT LIQD
7.0000 mL | Freq: Two times a day (BID) | OROMUCOSAL | Status: DC
  Administered 2015-09-13 – 2015-09-20 (×10): 7 mL via OROMUCOSAL

## 2015-09-13 MED ORDER — MUPIROCIN 2 % EX OINT
1.0000 "application " | TOPICAL_OINTMENT | Freq: Two times a day (BID) | CUTANEOUS | Status: AC
  Administered 2015-09-13 – 2015-09-18 (×10): 1 via NASAL
  Filled 2015-09-13 (×2): qty 22

## 2015-09-13 MED ORDER — TRAMADOL HCL 50 MG PO TABS
50.0000 mg | ORAL_TABLET | Freq: Four times a day (QID) | ORAL | Status: DC | PRN
  Administered 2015-09-13 – 2015-09-14 (×5): 50 mg via ORAL
  Filled 2015-09-13 (×5): qty 1

## 2015-09-13 MED ORDER — ONDANSETRON HCL 4 MG PO TABS: 4.0000 mg | ORAL_TABLET | Freq: Four times a day (QID) | ORAL | Status: DC | PRN

## 2015-09-13 MED ORDER — LORATADINE 10 MG PO TABS: 10.0000 mg | ORAL_TABLET | Freq: Every day | ORAL | Status: DC | PRN

## 2015-09-13 MED ORDER — ONDANSETRON HCL 4 MG/2ML IJ SOLN
4.0000 mg | Freq: Four times a day (QID) | INTRAMUSCULAR | Status: DC | PRN
  Administered 2015-09-16 – 2015-09-18 (×6): 4 mg via INTRAVENOUS
  Filled 2015-09-13 (×7): qty 2

## 2015-09-13 MED ORDER — DIPHENHYDRAMINE HCL 12.5 MG/5ML PO ELIX
12.5000 mg | ORAL_SOLUTION | Freq: Four times a day (QID) | ORAL | Status: DC | PRN
  Filled 2015-09-13: qty 5

## 2015-09-13 NOTE — Consult Note (Signed)
Reason for Consult: Parotitis Referring Physician: Rise Patience, MD  HPI:  Autumn Turner is an 55 y.o. female with multiple medical problems presents to the [facility] ER for treatment of her generalized weakness and acute renal failure. The patient has extensive surgical/medical history stemming from previous gastric bypass surgery and subsequent obstruction.  She was in rehabilitation facility after admission for pneumonia in 08/2015. She was noted to have increased fatigue, failure to thrive, petechial rash, poor oral intake, and anemia. Patient was sent for admission. She has been on Cl[place] post hospitalization for pneumonia. Family reports that she has been not eating or drinking well. The patient also c/o increasing left facial swelling and tenderness over the last 2 days. No reported fevers, vomiting, diarrhea. Patient has a history of decubitus ulcers.    Past Medical History  Diagnosis Date  . Abnormal Pap smear   . Vulvar lesion   . CIN II (cervical intraepithelial neoplasia II)   . Malnourished ([place])   . Sleep apnea     Loss weight with gastric bypass  . Cancer ([place])     vulvar  . Anemia     blood transfusion  . Hypertension     no longer on medication  . Anxiety   . Depression   . History of stomach ulcers     Past Surgical History  Procedure Laterality Date  . Cesarean section    . Gastric bypass  2001  . Gallbladder surgery  2002  . Tummy tuck  2004  . Leep  06/2009  . Cholecystectomy    . Tonsillectomy      age 27 adnoids  . Vulvectomy Bilateral 12/07/2012    Procedure: VULVECTOMY AND BILATERAL INGUINAL LYMPH NODE DISSECTION, PAP SMEAR;  Surgeon: Janie Morning, MD;  Location: [facility] ORS;  Service: Gynecology;  Laterality: Bilateral;  . Revision to gastric bypass    . Esophagogastroduodenoscopy N/A 06/29/2015    Procedure: ESOPHAGOGASTRODUODENOSCOPY (EGD);  Surgeon: Clarene Essex, MD;  Location: [facility] ENDOSCOPY;  Service: Endoscopy;  Laterality: N/A;  . Balloon dilation N/A  06/29/2015    Procedure: BALLOON DILATION;  Surgeon: Clarene Essex, MD;  Location: [facility] ENDOSCOPY;  Service: Endoscopy;  Laterality: N/A;  . Esophagogastroduodenoscopy (egd) with propofol N/A 07/17/2015    Procedure: ESOPHAGOGASTRODUODENOSCOPY (EGD) WITH PROPOFOL;  Surgeon: Clarene Essex, MD;  Location: [facility] ENDOSCOPY;  Service: Endoscopy;  Laterality: N/A;  . Balloon dilation N/A 07/17/2015    Procedure: BALLOON DILATION;  Surgeon: Clarene Essex, MD;  Location: [facility] ENDOSCOPY;  Service: Endoscopy;  Laterality: N/A;  . Esophageal stent placement N/A 07/17/2015    Procedure: ESOPHAGEAL STENT PLACEMENT;  Surgeon: Clarene Essex, MD;  Location: [facility] ENDOSCOPY;  Service: Endoscopy;  Laterality: N/A;  . Esophagogastroduodenoscopy N/A 08/24/2015    Procedure: ESOPHAGOGASTRODUODENOSCOPY (EGD);  Surgeon: Clarene Essex, MD;  Location: [facility] ENDOSCOPY;  Service: Endoscopy;  Laterality: N/A;    Family History  Problem Relation Age of Onset  . Cancer Father   . Hypertension Father   . Lung cancer Father     Social History:  reports that she has been smoking Cigarettes.  She has a 10 pack-year smoking history. She has never used smokeless tobacco. She reports that she does not drink alcohol or use illicit drugs.  Allergies:  Allergies  Allergen Reactions  . Carafate [Sucralfate] Other (See Comments)    unspecified  . Ciprocinonide [Fluocinolone] Hives and Rash  . Ciprofloxacin Other (See Comments)    Blisters, boils  . Dust Mite Extract Itching  .  Iohexol Hives       . Nsaids Other (See Comments)    Due to Hx of gastric bypass    Prior to Admission medications   Medication Sig Start Date End Date Taking? Authorizing Provider  buPROPion (WELLBUTRIN XL) 150 MG 24 hr tablet Take 300 mg by mouth daily.   Yes Historical Provider, MD  cholecalciferol (VITAMIN D) 1000 UNITS tablet Take 1,000 Units by mouth daily. Reported on 09/22/2015   Yes Historical Provider, MD  clidinium-chlordiazePOXIDE (LIBRAX) 5-2.5 MG capsule Take  1 capsule by mouth 4 (four) times daily -  before meals and at bedtime. 08/01/15  Yes Historical Provider, MD  clindamycin (CLEOCIN) 300 MG capsule Take 300 mg by mouth 3 (three) times daily.   Yes Historical Provider, MD  diphenhydrAMINE (BENADRYL) 12.5 MG chewable tablet Chew 12.5 mg by mouth 4 (four) times daily as needed for allergies.   Yes Historical Provider, MD  esomeprazole (NEXIUM) 20 MG capsule Take 20 mg by mouth daily.   Yes Historical Provider, MD  Multiple Vitamins-Minerals (MULTIVITAMIN PO) Take 1 tablet by mouth daily.    Yes Historical Provider, MD  saccharomyces boulardii (FLORASTOR) 250 MG capsule Take 250 mg by mouth 2 (two) times daily.   Yes Historical Provider, MD  traMADol (ULTRAM) 50 MG tablet TAKE 1 TABLET BY MOUTH THREE TIMES DAILY AS NEEDED 01/25/15  Yes Robyn Haber, MD  vitamin B-12 (CYANOCOBALAMIN) 1000 MCG tablet Take 1,000 mcg by mouth daily.   Yes Historical Provider, MD  loratadine (CLARITIN) 10 MG tablet Take 10 mg by mouth daily as needed for allergies or rhinitis.     Historical Provider, MD  oxycodone (OXY-IR) 5 MG capsule Take 5 mg by mouth every 6 (six) hours as needed.    Historical Provider, MD  protein supplement (UNJURY CHICKEN SOUP) POWD Take 7 g (2 oz total) by mouth daily. Patient not taking: Reported on 09/25/2015 08/20/15   Charlynne Cousins, MD  Tetrahydrozoline HCl (VISINE OP) Place 1-2 drops into both eyes as needed (for dry eyes). Reported on 09/15/2015    Historical Provider, MD    Medications:  I have reviewed the patient's current medications. Scheduled: . buPROPion  300 mg Oral Daily  . cholecalciferol  1,000 Units Oral Daily  . pantoprazole  40 mg Oral Daily  . saccharomyces boulardii  250 mg Oral BID  . vitamin B-12  1,000 mcg Oral Daily   [id number] **OR** acetaminophen, diphenhydrAMINE, loratadine, ondansetron **OR** ondansetron (ZOFRAN) IV, oxyCODONE, traMADol  Results for orders placed or performed during the hospital  encounter of 09/05/2015 (from the past 48 hour(s))  Type and screen [place]     Status: None (Preliminary result)   Collection Time: 09/04/2015  4:15 PM  Result Value Ref Range   ABO/RH(D) A POS    Antibody Screen NEG    Sample Expiration 09/15/2015    Unit Number [id number]    Blood Component Type RED CELLS,LR    Unit division 00    Status of Unit ALLOCATED    Transfusion Status OK TO TRANSFUSE    Crossmatch Result Compatible    Unit Number [id number]    Blood Component Type RED CELLS,LR    Unit division 00    Status of Unit ALLOCATED    Transfusion Status OK TO TRANSFUSE    Crossmatch Result Compatible   ABO/Rh     Status: None   Collection Time: 09/28/2015  4:15 PM  Result Value Ref Range   ABO/RH(D)  A POS   Lipase, blood     Status: None   Collection Time: 09/08/2015  4:25 PM  Result Value Ref Range   Lipase 14 11 - 51 U/L  Comprehensive metabolic panel     Status: Abnormal   Collection Time: 09/28/2015  4:25 PM  Result Value Ref Range   Sodium 132 (L) 135 - 145 mmol/L   Potassium 3.9 3.5 - 5.1 mmol/L   Chloride 93 (L) 101 - 111 mmol/L   CO2 27 22 - 32 mmol/L   Glucose, Bld 65 65 - 99 mg/dL   BUN 42 (H) 6 - 20 mg/dL   Creatinine, Ser 1.59 (H) 0.44 - 1.00 mg/dL   Calcium 9.1 8.9 - 10.3 mg/dL   Total Protein 5.6 (L) 6.5 - 8.1 g/dL   Albumin 1.2 (L) 3.5 - 5.0 g/dL   AST 19 15 - 41 U/L   ALT 16 14 - 54 U/L   Alkaline Phosphatase 368 (H) 38 - 126 U/L   Total Bilirubin 0.5 0.3 - 1.2 mg/dL   GFR calc non Af Amer 35 (L) >60 mL/min   GFR calc Af Amer 41 (L) >60 mL/min    Comment: (NOTE) The eGFR has been calculated using the CKD EPI equation. This calculation has not been validated in all clinical situations. eGFR's persistently <60 mL/min signify possible Chronic Kidney Disease.    Anion gap 12 5 - 15  CBC     Status: Abnormal   Collection Time: 09/26/2015  4:25 PM  Result Value Ref Range   WBC 17.7 (H) 4.0 - 10.5 K/uL   RBC 3.04 (L) 3.87 - 5.11  MIL/uL   Hemoglobin 7.5 (L) 12.0 - 15.0 g/dL   HCT 25.3 (L) 36.0 - 46.0 %   MCV 83.2 78.0 - 100.0 fL   MCH 24.7 (L) 26.0 - 34.0 pg   MCHC 29.6 (L) 30.0 - 36.0 g/dL   RDW 19.1 (H) 11.5 - 15.5 %   Platelets 459 (H) 150 - 400 K/uL  I-Stat CG4 Lactic Acid, ED     Status: None   Collection Time: 09/11/2015  4:34 PM  Result Value Ref Range   Lactic Acid, Venous 1.18 0.5 - 2.0 mmol/L  I-Stat CG4 Lactic Acid, ED     Status: None   Collection Time: 09/16/2015 10:00 PM  Result Value Ref Range   Lactic Acid, Venous 1.63 0.5 - 2.0 mmol/L  POC occult blood, ED     Status: None   Collection Time: 09/07/2015 10:06 PM  Result Value Ref Range   Fecal Occult Bld NEGATIVE NEGATIVE  Urinalysis, Routine w reflex microscopic (not at [facility])     Status: Abnormal   Collection Time: 09/19/2015 10:22 PM  Result Value Ref Range   Color, Urine AMBER (A) YELLOW    Comment: BIOCHEMICALS MAY BE AFFECTED BY COLOR   APPearance CLOUDY (A) CLEAR   Specific Gravity, Urine 1.014 1.005 - 1.030   pH 5.0 5.0 - 8.0   Glucose, UA NEGATIVE NEGATIVE mg/dL   Hgb urine dipstick LARGE (A) NEGATIVE   Bilirubin Urine SMALL (A) NEGATIVE   Ketones, ur NEGATIVE NEGATIVE mg/dL   Protein, ur 30 (A) NEGATIVE mg/dL   Nitrite NEGATIVE NEGATIVE   Leukocytes, UA LARGE (A) NEGATIVE  Urine microscopic-add on     Status: Abnormal   Collection Time: 09/25/2015 10:22 PM  Result Value Ref Range   Squamous Epithelial / LPF 0-5 (A) NONE SEEN   WBC, UA TOO NUMEROUS TO COUNT  0 - 5 WBC/hpf   RBC / HPF TOO NUMEROUS TO COUNT 0 - 5 RBC/hpf   Bacteria, UA FEW (A) NONE SEEN  CK     Status: Abnormal   Collection Time: 09/13/2015 10:42 PM  Result Value Ref Range   Total CK 15 (L) 38 - 234 U/L  Comprehensive metabolic panel     Status: Abnormal   Collection Time: 09/13/15  3:08 AM  Result Value Ref Range   Sodium 133 (L) 135 - 145 mmol/L   Potassium 3.4 (L) 3.5 - 5.1 mmol/L   Chloride 99 (L) 101 - 111 mmol/L   CO2 25 22 - 32 mmol/L   Glucose, Bld 63 (L)  65 - 99 mg/dL   BUN 40 (H) 6 - 20 mg/dL   Creatinine, Ser 1.62 (H) 0.44 - 1.00 mg/dL   Calcium 7.9 (L) 8.9 - 10.3 mg/dL   Total Protein 4.4 (L) 6.5 - 8.1 g/dL   Albumin <1.0 (L) 3.5 - 5.0 g/dL   AST 15 15 - 41 U/L   ALT 12 (L) 14 - 54 U/L   Alkaline Phosphatase 291 (H) 38 - 126 U/L   Total Bilirubin 0.5 0.3 - 1.2 mg/dL   GFR calc non Af Amer 34 (L) >60 mL/min   GFR calc Af Amer 40 (L) >60 mL/min    Comment: (NOTE) The eGFR has been calculated using the CKD EPI equation. This calculation has not been validated in all clinical situations. eGFR's persistently <60 mL/min signify possible Chronic Kidney Disease.    Anion gap 9 5 - 15  CBC WITH DIFFERENTIAL     Status: Abnormal   Collection Time: 09/13/15  3:08 AM  Result Value Ref Range   WBC 17.3 (H) 4.0 - 10.5 K/uL   RBC 2.44 (L) 3.87 - 5.11 MIL/uL   Hemoglobin 6.1 (LL) 12.0 - 15.0 g/dL    Comment: REPEATED TO VERIFY CRITICAL RESULT CALLED TO, READ BACK BY AND VERIFIED WITH: L BISHOP,RN 09/13/15 0326 RHOLMES    HCT 20.2 (L) 36.0 - 46.0 %   MCV 82.8 78.0 - 100.0 fL   MCH 25.0 (L) 26.0 - 34.0 pg   MCHC 30.2 30.0 - 36.0 g/dL   RDW 19.1 (H) 11.5 - 15.5 %   Platelets 437 (H) 150 - 400 K/uL   Neutrophils Relative % 91 %   Neutro Abs 15.8 (H) 1.7 - 7.7 K/uL   Lymphocytes Relative 5 %   Lymphs Abs 0.9 0.7 - 4.0 K/uL   Monocytes Relative 3 %   Monocytes Absolute 0.6 0.1 - 1.0 K/uL   Eosinophils Relative 0 %   Eosinophils Absolute 0.0 0.0 - 0.7 K/uL   Basophils Relative 0 %   Basophils Absolute 0.0 0.0 - 0.1 K/uL  Prepare RBC     Status: None   Collection Time: 09/13/15  4:56 AM  Result Value Ref Range   Order Confirmation ORDER PROCESSED BY BLOOD BANK   Lactate dehydrogenase     Status: Abnormal   Collection Time: 09/13/15  5:52 AM  Result Value Ref Range   LDH 202 (H) 98 - 192 U/L    Dg Chest Portable 1 View  09/22/2015  CLINICAL DATA:  55 year old female with previous pneumonia and weakness. Further to thrive. EXAM:  PORTABLE CHEST 1 VIEW COMPARISON:  Radiograph dated 08/16/2015 FINDINGS: Single-view of the chest demonstrates emphysematous changes of the lungs. Stable appearing right upper lobe cavity again noted. There has been interval improvement of the previously seen airspace  opacities bilaterally. Linear densities in the mid lung fields bilaterally likely represent atelectasis/scarring versus less likely residual infiltrate. There is a small right pleural effusion. No pneumothorax. The cardiac silhouette is within normal limits. The osseous structures appear unremarkable. IMPRESSION: Significant interval improvement of the previously seen bilateral airspace opacities. Residual mid lung field densities likely represent atelectasis/scarring versus residual infiltrate. Small right pleural effusion. Electronically Signed   By: Anner Crete M.D.   On: 09/24/2015 22:01   Review of Systems  Constitutional: Positive for appetite change. Negative for fever and chills.  HENT: Positive for facial swelling. Negative for rhinorrhea and sore throat.  Eyes: Negative for redness.  Respiratory: Negative for cough.  Cardiovascular: Negative for chest pain.  Gastrointestinal: Positive for nausea. Negative for vomiting, abdominal pain and diarrhea.  Genitourinary: Negative for dysuria.  Musculoskeletal: Negative for myalgias.  Skin: Positive for rash and wound.  Neurological: Negative for headaches.   Blood pressure 128/88, pulse 89, temperature 98.1 F (36.7 C), temperature source Rectal, resp. rate 14, SpO2 96 %.  Physical Exam  Constitutional: She appears lethargic. She appears cachectic.  Head: Atraumatic.  Ears: Normal auricles and EACs. Nose/Mouth/Throat: Mucous membranes are dry.  Swelling, tenderness and redness of the left parotid gland is noted. Eyes: Conjunctivae are normal. Right eye exhibits no discharge. Left eye exhibits no discharge.  Neck: Normal range of motion. Neck supple.  Cardiovascular:  Normal rate, regular rhythm and normal heart sounds.  Pulmonary/Chest: Effort normal and breath sounds normal. No respiratory distress. She has no wheezes.  Neurological: She appears lethargic.  Skin: Skin is warm and dry.  Psychiatric: She has a normal mood and affect.  Nursing note and vitals reviewed.  Assessment/Plan: Acute left parotitis, likely secondary to dehydration. Pt will be admitted for IV fluid. Will also need IV abx while in the hospital (e.g. Clindamycin or unasyn). Will follow.  Braxon Suder,SUI W 09/13/2015, 7:44 AM

## 2015-09-13 NOTE — Progress Notes (Signed)
Utilization review completed. Jayona Mccaig, RN, BSN. 

## 2015-09-13 NOTE — ED Notes (Signed)
No adverse affects to blood products noted or reported at this time.

## 2015-09-13 NOTE — Progress Notes (Signed)
ANTIBIOTIC CONSULT NOTE - INITIAL  Pharmacy Consult for Acyclovir Indication: herpes zoster  Allergies  Allergen Reactions  . Carafate [Sucralfate] Other (See Comments)    unspecified  . Ciprocinonide [Fluocinolone] Hives and Rash  . Ciprofloxacin Other (See Comments)    Blisters, boils  . Dust Mite Extract Itching  . Iohexol Hives       . Nsaids Other (See Comments)    Due to Hx of gastric bypass    Patient Measurements: Height: 5\' 4"  (162.6 cm) Weight: 105 lb 11.2 oz (47.945 kg) IBW/kg (Calculated) : 54.7  Vital Signs: Temp: 98.2 F (36.8 C) (01/12 1453) Temp Source: Oral (01/12 1453) BP: 148/81 mmHg (01/12 1453) Pulse Rate: 90 (01/12 1453) Intake/Output from previous day: 01/11 0701 - 01/12 0700 In: -  Out: 420 [Urine:420] Intake/Output from this shift: Total I/O In: 670 [Blood:670] Out: -   Labs:  Recent Labs  09/29/2015 1625 09/13/15 0308  WBC 17.7* 17.3*  HGB 7.5* 6.1*  PLT 459* 437*  CREATININE 1.59* 1.62*   Estimated Creatinine Clearance: 29.3 mL/min (by C-G formula based on Cr of 1.62). No results for input(s): VANCOTROUGH, VANCOPEAK, VANCORANDOM, GENTTROUGH, GENTPEAK, GENTRANDOM, TOBRATROUGH, TOBRAPEAK, TOBRARND, AMIKACINPEAK, AMIKACINTROU, AMIKACIN in the last 72 hours.   Microbiology: Recent Results (from the past 720 hour(s))  Urine culture     Status: None (Preliminary result)   Collection Time: 09/08/2015 10:22 PM  Result Value Ref Range Status   Specimen Description URINE, CATHETERIZED  Final   Special Requests NONE  Final   Culture TOO YOUNG TO READ  Final   Report Status PENDING  Incomplete    Medical History: Past Medical History  Diagnosis Date  . Abnormal Pap smear   . Vulvar lesion   . CIN II (cervical intraepithelial neoplasia II)   . Malnourished ([place])   . Sleep apnea     Loss weight with gastric bypass  . Cancer ([place])     vulvar  . Anemia     blood transfusion  . Hypertension     no longer on medication  . Anxiety    . Depression   . History of stomach ulcers    Assessment: 55 y.o. Female admitted with anemia/weakness, possible sepsis, started on vancomycin and zosyn earlier today. Now to add acyclovir for herpes skin infection on bottocks. Est. CrCl ~ 30 ml/min  Plan:  - Acyclovir 250 mg (~ 5mg /kg) Q 12 IV Q 12 hrs - Monitor renal function - Transition to po when appropriate  Maryanna Shape, PharmD, BCPS  Clinical Pharmacist  Pager: [phone]   09/13/2015,3:24 PM

## 2015-09-13 NOTE — H&P (Signed)
[facility] History and Physical  SHAILEEN MENGES [id number] DOB: [birth year] DOA: 09/24/2015  Referring physician: DR.Marland KitchenGieple. PCP: Robyn Haber, MD  Specialists: Dr. Johnnye Sima.  Chief Complaint: Weakness.  HPI: Autumn Turner is a 55 y.o. female was recently admitted to the hospital for cavitary pneumonia had followed up with Dr. Johnnye Sima in his clinic yesterday when patient was found to be very weak and was referred to the ER. Labs revealed acute renal failure with anemia with hemoglobin at her baseline. In addition patient is found to have a new rash involving the extremities. Denies any itching. There is some involvement of the oral mucosa but no congestion of the eyes. Patient was recently found to have increasing swelling of the left parotid over the last 2 days. The skin rash been coming off and on for last 1 week. Patient has history of gastric bypass and outlet obstruction and was admitted for nausea vomiting recently. During last admission patient's sputum grew MRSA and patient was placed on Zyvox and discharged on clindamycin. Patient during that stay also had thoracentesis done. On exam patient appears weak and dry. Chest x-ray shows resolving infiltrates. Patient has a rash involving the lower extremities hands and oral mucosa. Denies any difficulty swallowing. Has not had any further episodes of nausea vomiting.   Review of Systems: As presented in the history of presenting illness, rest negative.  Past Medical History  Diagnosis Date  . Abnormal Pap smear   . Vulvar lesion   . CIN II (cervical intraepithelial neoplasia II)   . Malnourished ([place])   . Sleep apnea     Loss weight with gastric bypass  . Cancer ([place])     vulvar  . Anemia     blood transfusion  . Hypertension     no longer on medication  . Anxiety   . Depression   . History of stomach ulcers    Past Surgical History  Procedure Laterality Date  . Cesarean section    . Gastric bypass  2001  .  Gallbladder surgery  2002  . Tummy tuck  2004  . Leep  06/2009  . Cholecystectomy    . Tonsillectomy      age 68 adnoids  . Vulvectomy Bilateral 12/07/2012    Procedure: VULVECTOMY AND BILATERAL INGUINAL LYMPH NODE DISSECTION, PAP SMEAR;  Surgeon: Janie Morning, MD;  Location: [facility];  Service: Gynecology;  Laterality: Bilateral;  . Revision to gastric bypass    . Esophagogastroduodenoscopy N/A 06/29/2015    Procedure: ESOPHAGOGASTRODUODENOSCOPY (EGD);  Surgeon: Clarene Essex, MD;  Location: [facility] ENDOSCOPY;  Service: Endoscopy;  Laterality: N/A;  . Balloon dilation N/A 06/29/2015    Procedure: BALLOON DILATION;  Surgeon: Clarene Essex, MD;  Location: [facility] ENDOSCOPY;  Service: Endoscopy;  Laterality: N/A;  . Esophagogastroduodenoscopy (egd) with propofol N/A 07/17/2015    Procedure: ESOPHAGOGASTRODUODENOSCOPY (EGD) WITH PROPOFOL;  Surgeon: Clarene Essex, MD;  Location: [facility] ENDOSCOPY;  Service: Endoscopy;  Laterality: N/A;  . Balloon dilation N/A 07/17/2015    Procedure: BALLOON DILATION;  Surgeon: Clarene Essex, MD;  Location: [facility] ENDOSCOPY;  Service: Endoscopy;  Laterality: N/A;  . Esophageal stent placement N/A 07/17/2015    Procedure: ESOPHAGEAL STENT PLACEMENT;  Surgeon: Clarene Essex, MD;  Location: [facility] ENDOSCOPY;  Service: Endoscopy;  Laterality: N/A;  . Esophagogastroduodenoscopy N/A 08/24/2015    Procedure: ESOPHAGOGASTRODUODENOSCOPY (EGD);  Surgeon: Clarene Essex, MD;  Location: [facility] ENDOSCOPY;  Service: Endoscopy;  Laterality: N/A;   Social History:  reports that she has been smoking Cigarettes.  She has a 10 pack-year smoking history. She has never used smokeless tobacco. She reports that she does not drink alcohol or use illicit drugs. Where does patient live nursing home. Can patient participate in ADLs? Not sure.  Allergies  Allergen Reactions  . Carafate [Sucralfate] Other (See Comments)    unspecified  . Ciprocinonide [Fluocinolone] Hives and Rash  . Ciprofloxacin Other (See Comments)    Blisters,  boils  . Dust Mite Extract Itching  . Iohexol Hives       . Nsaids Other (See Comments)    Due to Hx of gastric bypass    Family History:  Family History  Problem Relation Age of Onset  . Cancer Father   . Hypertension Father   . Lung cancer Father       Prior to Admission medications   Medication Sig Start Date End Date Taking? Authorizing Provider  buPROPion (WELLBUTRIN XL) 150 MG 24 hr tablet Take 300 mg by mouth daily.   Yes Historical Provider, MD  cholecalciferol (VITAMIN D) 1000 UNITS tablet Take 1,000 Units by mouth daily. Reported on 09/14/2015   Yes Historical Provider, MD  clidinium-chlordiazePOXIDE (LIBRAX) 5-2.5 MG capsule Take 1 capsule by mouth 4 (four) times daily -  before meals and at bedtime. 08/01/15  Yes Historical Provider, MD  clindamycin (CLEOCIN) 300 MG capsule Take 300 mg by mouth 3 (three) times daily.   Yes Historical Provider, MD  diphenhydrAMINE (BENADRYL) 12.5 MG chewable tablet Chew 12.5 mg by mouth 4 (four) times daily as needed for allergies.   Yes Historical Provider, MD  esomeprazole (NEXIUM) 20 MG capsule Take 20 mg by mouth daily.   Yes Historical Provider, MD  Multiple Vitamins-Minerals (MULTIVITAMIN PO) Take 1 tablet by mouth daily.    Yes Historical Provider, MD  saccharomyces boulardii (FLORASTOR) 250 MG capsule Take 250 mg by mouth 2 (two) times daily.   Yes Historical Provider, MD  traMADol (ULTRAM) 50 MG tablet TAKE 1 TABLET BY MOUTH THREE TIMES DAILY AS NEEDED 01/25/15  Yes Robyn Haber, MD  vitamin B-12 (CYANOCOBALAMIN) 1000 MCG tablet Take 1,000 mcg by mouth daily.   Yes Historical Provider, MD  loratadine (CLARITIN) 10 MG tablet Take 10 mg by mouth daily as needed for allergies or rhinitis.     Historical Provider, MD  oxycodone (OXY-IR) 5 MG capsule Take 5 mg by mouth every 6 (six) hours as needed.    Historical Provider, MD  protein supplement (UNJURY CHICKEN SOUP) POWD Take 7 g (2 oz total) by mouth daily. Patient not taking:  Reported on 09/02/2015 08/20/15   Charlynne Cousins, MD  Tetrahydrozoline HCl (VISINE OP) Place 1-2 drops into both eyes as needed (for dry eyes). Reported on 09/08/2015    Historical Provider, MD    Physical Exam: Filed Vitals:   09/13/15 0030 09/13/15 0045 09/13/15 0100 09/13/15 0115  BP: 129/85 137/79 140/83 134/85  Pulse: 87 89 89 87  Temp:      TempSrc:      Resp: 15 13 13 13   SpO2: 100% 100% 100% 100%     General:  Moderately built and poorly nourished.  Eyes: Anicteric pallor.  ENT: No discharge from the ears eyes nose and mouth. Left parotid swelling. Oral mucosa has some rash.  Neck: No neck rigidity.  Cardiovascular: S1 and S2 heard.  Respiratory: No rhonchi or crepitations.  Abdomen: Soft nontender bowel sounds present.  Skin: Petechial rash involving the lower extremities and upper extremities and oral mucosa.  Musculoskeletal:  No edema.  Psychiatric: Appears normal.  Neurologic: Alert awake oriented to time place and person. Moves all extremities.  Labs on Admission:  Basic Metabolic Panel:  Recent Labs Lab 09/25/2015 1625  NA 132*  K 3.9  CL 93*  CO2 27  GLUCOSE 65  BUN 42*  CREATININE 1.59*  CALCIUM 9.1   Liver Function Tests:  Recent Labs Lab 09/03/2015 1625  AST 19  ALT 16  ALKPHOS 368*  BILITOT 0.5  PROT 5.6*  ALBUMIN 1.2*    Recent Labs Lab 09/28/2015 1625  LIPASE 14   No results for input(s): AMMONIA in the last 168 hours. CBC:  Recent Labs Lab 09/06/2015 1625  WBC 17.7*  HGB 7.5*  HCT 25.3*  MCV 83.2  PLT 459*   Cardiac Enzymes:  Recent Labs Lab 09/18/2015 2242  CKTOTAL 15*    BNP (last 3 results) No results for input(s): BNP in the last 8760 hours.  ProBNP (last 3 results) No results for input(s): PROBNP in the last 8760 hours.  CBG: No results for input(s): GLUCAP in the last 168 hours.  Radiological Exams on Admission: Dg Chest Portable 1 View  09/25/2015  CLINICAL DATA:  55 year old female with  previous pneumonia and weakness. Further to thrive. EXAM: PORTABLE CHEST 1 VIEW COMPARISON:  Radiograph dated 08/16/2015 FINDINGS: Single-view of the chest demonstrates emphysematous changes of the lungs. Stable appearing right upper lobe cavity again noted. There has been interval improvement of the previously seen airspace opacities bilaterally. Linear densities in the mid lung fields bilaterally likely represent atelectasis/scarring versus less likely residual infiltrate. There is a small right pleural effusion. No pneumothorax. The cardiac silhouette is within normal limits. The osseous structures appear unremarkable. IMPRESSION: Significant interval improvement of the previously seen bilateral airspace opacities. Residual mid lung field densities likely represent atelectasis/scarring versus residual infiltrate. Small right pleural effusion. Electronically Signed   By: Anner Crete M.D.   On: 09/25/2015 22:01     Assessment/Plan Principal Problem:   AKI (acute kidney injury) ([place]) Active Problems:   Rash   Parotid swelling   ARF (acute renal failure) ([place])   1. Acute renal failure probably from poor oral intake and dehydration - urine does show too many RBCs and WBCs. At this time we will gently hydrate him closely for intake output and metabolic panel. Since patient has a rash we will check urine eosinophils to rule out interstitial nephritis. Check FENa. 2. Skin rash nonitching - may be drug-related. Patient also has mild oral mucosal involvement. Rash is petechial and nontender. Patient is afebrile. I have ordered 1 dose of Solu-Medrol for now. Closely observe. 3. Left Parotid swellinig - discussed with Dr.Teoh ENT surgeon Felt to be secondary to dehydration. Dr.Teoh Advised antibiotics and hydration. 4. Anemia - hemoglobin just get baseline. There is any further decline in hemoglobin will transfuse. Will check LDH to rule out any hemolytic process. Check peripheral smear  yesterday. 5. Recent admission for cavitary pneumonia with MRSA 6. Protein calorie malnutrition with history of gastric bypass - nutrition consult. 7. Elevated alkaline phosphatase levels - cause not known. Closely observe.  At this time I have placed patient on empiric antibiotics for possible UTI and x-rays showing airspace opacities which are resolving. Follow blood cultures and urine cultures and at that point to decide if patient would need for antibiotics.   DVT Prophylaxis SCDs because of severe anemia.  Code Status: Full code.  Family Communication: Discussed with patient's husband.  Disposition admit to inpatient.  KAKRAKANDY,ARSHAD N. [facility] Pager [phone].  If 7PM-7AM, please contact night-coverage [email or web address] Password [email or web address] 09/13/2015, 2:06 AM

## 2015-09-13 NOTE — ED Notes (Signed)
Dr. Kakrakandy at bedside. 

## 2015-09-13 NOTE — Progress Notes (Signed)
Report received from Gabe ,RN for admission to 5W05 

## 2015-09-13 NOTE — Consult Note (Signed)
[facility] for Infectious Disease    Date of Admission:  09/26/2015   Total days of antibiotics ?        Day 2 vancomycin        Day 2 piperacillin tazobactam              Reason for Consult: Acute parotitis and possible zoster    Referring Physician: Dr. Eulogio Bear  Principal Problem:   AKI (acute kidney injury) [facility]) Active Problems:   Protein-calorie malnutrition, severe   Acute parotitis   Pressure ulcer   Leukocytoclastic vasculitis ([place])   ARF (acute renal failure) ([place])   . acyclovir  250 mg Intravenous Q12H  . antiseptic oral rinse  7 mL Mouth Rinse BID  . buPROPion  300 mg Oral Daily  . [START ON 09/14/2015] Chlorhexidine Gluconate Cloth  6 each Topical Q0600  . cholecalciferol  1,000 Units Oral Daily  . mupirocin ointment  1 application Nasal BID  . pantoprazole  40 mg Oral Daily  . piperacillin-tazobactam (ZOSYN)  IV  3.375 g Intravenous 3 times per day  . saccharomyces boulardii  250 mg Oral BID  . vancomycin  500 mg Intravenous Q24H  . vitamin B-12  1,000 mcg Oral Daily    Recommendations: 1. Continue vancomycin and piperacillin tazobactam 2. Continue IV fluid hydration 3. Discontinue acyclovir 4. Discontinue airborne precautions   Assessment: Autumn Turner is chronically ill. Her MRSA pneumonia has resolved but she has severe malnutrition and failure to thrive. This is leading to dehydration and acute left parotitis. I agree with hydration, vancomycin and piperacillin tazobactam for now. The rash on her legs and feet looks typical of leukocytoclastic vasculitis. I suspect it is an allergic reaction to clindamycin or some other recent medication. I do not think that the 2 ulcers on her left buttocks are likely to be due to zoster. The ulcers are larger than typically seen with zoster. Also there are usually multiple ulcers in varying stages of evolution. I am comfortable stopping acyclovir and airborne precautions at this time.    HPI: Autumn Turner is a 55 y.o. female with protein calorie malnutrition and failure to thrive related to intractable nausea and vomiting. She was hospitalized last month with severe necrotizing MRSA pneumonia. Was treated with IV vancomycin followed by a brief course of oral linezolid then transitioned to clindamycin. It is unclear to me how long she continued the clindamycin. She will return to see my partner, Dr. Lita Mains, yesterday who readmitted her to the hospital because of generalized failure to thrive. She developed painful left parotid swelling recently. When seen today there was also concern that she might have developed zoster on her sacrum.   Review of Systems: Review of Systems  Unable to perform ROS: mental acuity    Past Medical History  Diagnosis Date  . Abnormal Pap smear   . Vulvar lesion   . CIN II (cervical intraepithelial neoplasia II)   . Malnourished ([place])   . Sleep apnea     Loss weight with gastric bypass  . Cancer ([place])     vulvar  . Anemia     blood transfusion  . Hypertension     no longer on medication  . Anxiety   . Depression   . History of stomach ulcers     Social History  Substance Use Topics  . Smoking status: Current Some Day Smoker -- 0.50 packs/day for 20 years  Types: Cigarettes  . Smokeless tobacco: Never Used  . Alcohol Use: No    Family History  Problem Relation Age of Onset  . Cancer Father   . Hypertension Father   . Lung cancer Father    Allergies  Allergen Reactions  . Carafate [Sucralfate] Other (See Comments)    unspecified  . Ciprocinonide [Fluocinolone] Hives and Rash  . Ciprofloxacin Other (See Comments)    Blisters, boils  . Dust Mite Extract Itching  . Iohexol Hives       . Nsaids Other (See Comments)    Due to Hx of gastric bypass    OBJECTIVE: Blood pressure 148/81, pulse 90, temperature 98.2 F (36.8 C), temperature source Oral, resp. rate 17, height 5\' 4"  (1.626 m), weight 105 lb 11.2 oz (47.945 kg), SpO2 100  %.  Physical Exam  Constitutional:  She appears very thin and frail. She is very lethargic and does not answer questions. A close friend is at the bedside.  HENT:  Her oral mucous membranes are dry. She has left parotid swelling with overlying erythema. The gland is tender to palpation. There is no fluctuance.  Cardiovascular: Normal rate and regular rhythm.   No murmur heard. Pulmonary/Chest: Effort normal and breath sounds normal.  Abdominal: Soft.  Skin:  She has a nonblanching erythematous maculopapular rash on her lower extremities to the level of the knee and to a lesser extent on her hands.  She has some early pressure sores and linear excoriations on her buttocks. There are 2 large 15 mm ulcers on her left buttock. There is no surrounding erythema. There is no drainage or odor.    Lab Results Lab Results  Component Value Date   WBC 17.3* 09/13/2015   HGB 6.1* 09/13/2015   HCT 20.2* 09/13/2015   MCV 82.8 09/13/2015   PLT 437* 09/13/2015    Lab Results  Component Value Date   CREATININE 1.62* 09/13/2015   BUN 40* 09/13/2015   NA 133* 09/13/2015   K 3.4* 09/13/2015   CL 99* 09/13/2015   CO2 25 09/13/2015    Lab Results  Component Value Date   ALT 12* 09/13/2015   AST 15 09/13/2015   ALKPHOS 291* 09/13/2015   BILITOT 0.5 09/13/2015     Microbiology: Recent Results (from the past 240 hour(s))  Urine culture     Status: None (Preliminary result)   Collection Time: 09/26/2015 10:22 PM  Result Value Ref Range Status   Specimen Description URINE, CATHETERIZED  Final   Special Requests NONE  Final   Culture TOO YOUNG TO READ  Final   Report Status PENDING  Incomplete  MRSA PCR Screening     Status: Abnormal   Collection Time: 09/13/15  2:21 PM  Result Value Ref Range Status   MRSA by PCR POSITIVE (A) NEGATIVE Final    Comment: RESULT CALLED TO, READ BACK BY AND VERIFIED WITH: Donnal Debar RN 17:25 09/13/15 ([email or web address])        The GeneXpert MRSA Assay (FDA approved  for NASAL specimens only), is one component of a comprehensive MRSA colonization surveillance program. It is not intended to diagnose MRSA infection nor to guide or monitor treatment for MRSA infections.     Michel Bickers, MD [facility] for [facility] [phone] pager   [phone] cell 09/13/2015, 5:33 PM

## 2015-09-13 NOTE — ED Notes (Signed)
Vann MD returned page and requests that pt be swallow screened again due to now being alert and oriented.

## 2015-09-13 NOTE — Progress Notes (Signed)
ANTIBIOTIC CONSULT NOTE - INITIAL  Pharmacy Consult for Vancomycin and Zosyn  Indication: rule out sepsis  Allergies  Allergen Reactions  . Carafate [Sucralfate] Other (See Comments)    unspecified  . Ciprocinonide [Fluocinolone] Hives and Rash  . Ciprofloxacin Other (See Comments)    Blisters, boils  . Dust Mite Extract Itching  . Iohexol Hives       . Nsaids Other (See Comments)    Due to Hx of gastric bypass    Patient Measurements:    Vital Signs: Temp: 98.7 F (37.1 C) (01/11 2105) Temp Source: Rectal (01/11 2105) BP: 150/90 mmHg (01/12 0215) Pulse Rate: 86 (01/12 0215) Intake/Output from previous day: 01/11 0701 - 01/12 0700 In: -  Out: 420 [Urine:420] Intake/Output from this shift: Total I/O In: -  Out: 420 [Urine:420]  Labs:  Recent Labs  09/19/2015 1625  WBC 17.7*  HGB 7.5*  PLT 459*  CREATININE 1.59*   CrCl cannot be calculated (Unknown ideal weight.). No results for input(s): VANCOTROUGH, VANCOPEAK, VANCORANDOM, GENTTROUGH, GENTPEAK, GENTRANDOM, TOBRATROUGH, TOBRAPEAK, TOBRARND, AMIKACINPEAK, AMIKACINTROU, AMIKACIN in the last 72 hours.   Microbiology: Recent Results (from the past 720 hour(s))  AFB culture with smear     Status: None (Preliminary result)   Collection Time: 08/14/15 12:25 PM  Result Value Ref Range Status   Specimen Description SPUTUM  Final   Special Requests Immunocompromised  Final   Acid Fast Smear   Final    NO ACID FAST BACILLI SEEN Performed at [facility]    Culture   Final    CULTURE WILL BE EXAMINED FOR 6 WEEKS BEFORE ISSUING A FINAL REPORT Performed at [facility]    Report Status PENDING  Incomplete  Body fluid culture     Status: None   Collection Time: 08/14/15  1:45 PM  Result Value Ref Range Status   Specimen Description FLUID PLEURAL  Final   Special Requests Normal  Final   Gram Stain   Final    ABUNDANT WBC PRESENT,BOTH PMN AND MONONUCLEAR NO ORGANISMS SEEN    Culture NO GROWTH 3  DAYS  Final   Report Status 08/18/2015 FINAL  Final  Fungus Culture with Smear     Status: None   Collection Time: 08/14/15  1:45 PM  Result Value Ref Range Status   Specimen Description FLUID PLEURAL  Final   Special Requests NONE  Final   Fungal Smear   Final    NO YEAST OR FUNGAL ELEMENTS SEEN Performed at [facility]    Culture   Final    No Fungi Isolated in 4 Weeks Performed at [facility]    Report Status 09/11/2015 FINAL  Final    Medical History: Past Medical History  Diagnosis Date  . Abnormal Pap smear   . Vulvar lesion   . CIN II (cervical intraepithelial neoplasia II)   . Malnourished ([place])   . Sleep apnea     Loss weight with gastric bypass  . Cancer ([place])     vulvar  . Anemia     blood transfusion  . Hypertension     no longer on medication  . Anxiety   . Depression   . History of stomach ulcers     Medications:  Wellbutrin  Ultram  Librax  Cleocin  Nexium  Claritin  MVI  Assessment: 55 y.o. female with h/o cavitary PNA, on Clindamycin at home, now with anemia/weakness, possible sepsis, for empiric antibiotics .  Vancomycin 1  g IV given in ED at  0045  Goal of Therapy:  Vancomycin trough level 15-20 mcg/ml  Plan:  Vancomycin 500 mg IV q24h Zosyn 3.375 g IV q8h   Vianney Kopecky, [place] Curb 09/13/2015,2:37 AM

## 2015-09-13 NOTE — Progress Notes (Signed)
Patient arrived to unit alert x4. Rash to legs and hands. Wound to buttock and sacrum and Dr. Eliseo Squires made aware. Unit of blood currently transfusing.

## 2015-09-13 NOTE — Consult Note (Addendum)
WOC wound consult note Reason for Consult: Consult requested for several lesions on bilat buttocks and coccyx.  Appearance consistent with possible shingles.  Assessed location with Dr Eliseo Squires at the bedside. Pt states she had shingles before on her back. Wound type: Several red slightly raised vesicles in a linear pattern across bilat buttocks.  Some of these have ruptured and evolved into full thickness wounds in 4 sites; each approx .2X.2X.1cm, yellow moist wound beds, no odor, small amt yellow drainage, very painful. One is located in close proximity to the coccyx, but is not directly underneath the protruding bone. There is another site lower on the left thigh; approx 1X1cm dark reddish purple without open wound or drainage.   Appearance and locations are NOT consistent with pressure ulcers. Dressing procedure/placement/frequency: Topical treatment is not effective for shingles; air mattress ordered to reduce discomfort to the affected area and to increase airflow.  Foam dressing to protect from further injury when moving up in bed.   Please re-consult if further assistance is needed.  Thank-you,  Julien Girt MSN, [place], [place], [place]andy Kitchen, [place]

## 2015-09-13 NOTE — ED Notes (Signed)
Dr. Hal Hope notified about patients hemoglobin level

## 2015-09-13 NOTE — ED Notes (Signed)
Myself and Erin, RN placed patient on the bedpan; cleaned patient and applied a non adherent pad to her buttocks because of open soars; patient resting at this time; visitor at bedside

## 2015-09-13 NOTE — ED Notes (Signed)
Consent for blood transfusion obtained 

## 2015-09-13 NOTE — Progress Notes (Signed)
MRSA PCR Positive. MD paged to make aware. Order set started.

## 2015-09-13 NOTE — ED Notes (Signed)
Paged admitting MD x2 regarding IV pain medication for pt

## 2015-09-13 NOTE — ED Notes (Signed)
Paged admitting MD regarding IV pain medication and pt NPO status.

## 2015-09-13 NOTE — Progress Notes (Signed)
Patient admitted after midnight, please see H&P.   Appears severely malnourished Looked at buttocks with wound care--- appears to have herpes zoster-- will start acyclovir- tx to negative pressure room -right sided parotid gland swelling-- ENT following- IVF and IV abx -mouth very dry- IVF -s/p 1 unit PRBC -very ill appearing-- recent diagnosis of bulemia at sNF??? ID consult  Eulogio Bear DO

## 2015-09-14 ENCOUNTER — Inpatient Hospital Stay (HOSPITAL_COMMUNITY): Payer: Medicare Other

## 2015-09-14 DIAGNOSIS — B9561 Methicillin susceptible Staphylococcus aureus infection as the cause of diseases classified elsewhere: Secondary | ICD-10-CM

## 2015-09-14 DIAGNOSIS — R7881 Bacteremia: Secondary | ICD-10-CM

## 2015-09-14 DIAGNOSIS — E860 Dehydration: Secondary | ICD-10-CM | POA: Insufficient documentation

## 2015-09-14 LAB — COMPREHENSIVE METABOLIC PANEL
ALT: 14 U/L (ref 14–54)
AST: 15 U/L (ref 15–41)
Albumin: <1 g/dL — ABNORMAL LOW (ref 3.5–5.0)
Alkaline Phosphatase: 283 U/L — ABNORMAL HIGH (ref 38–126)
Anion gap: 9 (ref 5–15)
BUN: 38 mg/dL — AB (ref 6–20)
CHLORIDE: 102 mmol/L (ref 101–111)
CO2: 24 mmol/L (ref 22–32)
CREATININE: 1.59 mg/dL — AB (ref 0.44–1.00)
Calcium: 8.1 mg/dL — ABNORMAL LOW (ref 8.9–10.3)
GFR calc Af Amer: 41 mL/min — ABNORMAL LOW (ref >60)
GFR calc non Af Amer: 35 mL/min — ABNORMAL LOW (ref >60)
Glucose, Bld: 38 mg/dL — CL (ref 65–99)
Potassium: 3.2 mmol/L — ABNORMAL LOW (ref 3.5–5.1)
SODIUM: 135 mmol/L (ref 135–145)
Total Bilirubin: 0.6 mg/dL (ref 0.3–1.2)
Total Protein: 4.9 g/dL — ABNORMAL LOW (ref 6.5–8.1)

## 2015-09-14 LAB — CBC
HCT: 27.4 % — ABNORMAL LOW (ref 36.0–46.0)
Hemoglobin: 9.3 g/dL — ABNORMAL LOW (ref 12.0–15.0)
MCH: 27.7 pg (ref 26.0–34.0)
MCHC: 33.9 g/dL (ref 30.0–36.0)
MCV: 81.5 fL (ref 78.0–100.0)
PLATELETS: 413 10*3/uL — AB (ref 150–400)
RBC: 3.36 MIL/uL — AB (ref 3.87–5.11)
RDW: 16.4 % — ABNORMAL HIGH (ref 11.5–15.5)
WBC: 25.2 10*3/uL — AB (ref 4.0–10.5)

## 2015-09-14 LAB — TYPE AND SCREEN
ABO/RH(D): A POS
ANTIBODY SCREEN: NEGATIVE
UNIT DIVISION: 0
Unit division: 0

## 2015-09-14 LAB — GLUCOSE, CAPILLARY: Glucose-Capillary: 78 mg/dL (ref 65–99)

## 2015-09-14 MED ORDER — DEXTROSE-NACL 5-0.9 % IV SOLN
INTRAVENOUS | Status: DC
  Administered 2015-09-14: 12:00:00 via INTRAVENOUS

## 2015-09-14 MED ORDER — KCL IN DEXTROSE-NACL 20-5-0.9 MEQ/L-%-% IV SOLN
INTRAVENOUS | Status: AC
  Administered 2015-09-14 – 2015-09-18 (×5): via INTRAVENOUS
  Filled 2015-09-14 (×9): qty 1000

## 2015-09-14 MED ORDER — BOOST / RESOURCE BREEZE PO LIQD
1.0000 | Freq: Two times a day (BID) | ORAL | Status: DC
  Administered 2015-09-15 – 2015-09-18 (×6): 1 via ORAL

## 2015-09-14 MED ORDER — ADULT MULTIVITAMIN W/MINERALS CH
1.0000 | ORAL_TABLET | Freq: Every day | ORAL | Status: DC
  Administered 2015-09-15: 1 via ORAL
  Filled 2015-09-14 (×5): qty 1

## 2015-09-14 NOTE — Care Management Important Message (Signed)
Important Message  Patient Details  Name: ISABELLY CATLEDGE MRN: [id number] Date of Birth: [birth year]   Medicare Important Message Given:  Yes    Carles Collet, RN 09/14/2015, 12:25 [place] Message  Patient Details  Name: DEVANNY REISSIG MRN: [id number] Date of Birth: 05-31-1961   Medicare Important Message Given:  Yes    Carles Collet, RN 09/14/2015, 12:25 PM

## 2015-09-14 NOTE — Progress Notes (Signed)
Initial Nutrition Assessment  DOCUMENTATION CODES:   Severe malnutrition in context of chronic illness, Underweight  INTERVENTION:   -MVI daily -Boost Breeze po BID, each supplement provides 250 kcal and 9 grams of protein -Ensure Enlive po BID, each supplement provides 350 kcal and 20 grams of protein  NUTRITION DIAGNOSIS:   Inadequate oral intake related to altered GI function as evidenced by per patient/family report.  GOAL:   Patient will meet greater than or equal to 90% of their needs  MONITOR:   PO intake, Supplement acceptance, Diet advancement, Labs, Weight trends, Skin, I & O's  REASON FOR ASSESSMENT:   Malnutrition Screening Tool    ASSESSMENT:   JOURNEI FOUST is a 55 y.o. female was recently admitted to the hospital for cavitary pneumonia had followed up with Dr. Johnnye Sima in his clinic yesterday when patient was found to be very weak and was referred to the ER. Labs revealed acute renal failure with anemia with hemoglobin at her baseline. In addition patient is found to have a new rash involving the extremities  Pt admitted with AKI secondary to dehydration. She is a resident of [facility].   Attempted to examine pt x3, however, receiving nursing care at time of visit. Noted pt is frail and thin appearing with severe fat and muscle depletion on orbital and temple regions. Nutrition-focused physical exam done on previous admission (08/16/15), which revealed severe fat and muscle depletion; RD suspects no changes to exam at this time. Pt with hx of severe malnutrition in the context of chronic illness which is ongoing.   Pt on clear liquids at time of visit visit, but noted that diet has since been advanced to fulls. RD will continue with Boost Breeze supplement, but also will add Ensure Enlive supplement, as pt enjoyed this supplement during last admission.   Labs reviewed: K: 3.2 (on IV supplementation).   Diet Order:  Diet full liquid Room service  appropriate?: Yes; Fluid consistency:: Thin  Skin:  Wound (see comment) (4 full thickness wounds across bilateral buttocks)  Last BM:  09/13/15  Height:   Ht Readings from Last 1 Encounters:  09/13/15 5\' 4"  (1.626 m)    Weight:   Wt Readings from Last 1 Encounters:  09/13/15 105 lb 11.2 oz (47.945 kg)    Ideal Body Weight:  54.5 kg  BMI:  Body mass index is 18.13 kg/(m^2).  Estimated Nutritional Needs:   Kcal:  1500-1700  Protein:  60-75 grams  Fluid:  1.5-1.7 L  EDUCATION NEEDS:   No education needs identified at this time  Elizabeht Suto A. Jimmye Norman, RD, LDN, CDE Pager: [phone] After hours Pager: [phone]

## 2015-09-14 NOTE — Care Management Note (Signed)
Case Management Note  Patient Details  Name: Autumn Turner MRN: [id number] Date of Birth: [birth year]  Subjective/Objective:                 Patient admitted form [facility].   Action/Plan:  Anticipate DC to SNF when medically stable  Expected Discharge Date:                  Expected Discharge Plan:  Skilled Nursing Facility  In-House Referral:  Clinical Social Work  Discharge planning Services  CM Consult  Post Acute Care Choice:    Choice offered to:     DME Arranged:    DME Agency:     HH Arranged:    [place] Agency:     Status of Service:  Completed, signed off  Medicare Important Message Given:  Yes Date Medicare IM Given:    Medicare IM give by:    Date Additional Medicare IM Given:    Additional Medicare Important Message give by:     If discussed at [place] of Stay Meetings, dates discussed:    Additional Comments:  Carles Collet, RN 09/14/2015, 12:49 PM

## 2015-09-14 NOTE — Progress Notes (Signed)
Patient ID: Autumn Turner, female   DOB: June 17, 1961, 55 y.o.   MRN: [id number]         [address] [facility] for Infectious Disease    Date of Admission:  09/27/2015           Day 3 vancomycin        Day 3 piperacillin tazobactam  Principal Problem:   AKI (acute kidney injury) ([place]) Active Problems:   Protein-calorie malnutrition, severe   Acute parotitis   Pressure ulcer   Leukocytoclastic vasculitis ([place])   ARF (acute renal failure) ([place])   . antiseptic oral rinse  7 mL Mouth Rinse BID  . buPROPion  300 mg Oral Daily  . Chlorhexidine Gluconate Cloth  6 each Topical Q0600  . cholecalciferol  1,000 Units Oral Daily  . feeding supplement  1 Container Oral TID BM  . feeding supplement (ENSURE ENLIVE)  237 mL Oral BID BM  . mupirocin ointment  1 application Nasal BID  . pantoprazole  40 mg Oral Daily  . piperacillin-tazobactam (ZOSYN)  IV  3.375 g Intravenous Q8H  . saccharomyces boulardii  250 mg Oral BID  . vancomycin  500 mg Intravenous Q24H  . vitamin B-12  1,000 mcg Oral Daily    SUBJECTIVE: She is feeling a little bit better. She has been having terrible dry mouth. She is still in pain but the tenderness over her left parotid gland has decreased slightly since admission.  Review of Systems: Review of Systems  Constitutional: Negative for fever and chills.  Respiratory: Negative for cough and sputum production.   Cardiovascular: Negative for chest pain.    Past Medical History  Diagnosis Date  . Abnormal Pap smear   . Vulvar lesion   . CIN II (cervical intraepithelial neoplasia II)   . Malnourished ([place])   . Sleep apnea     Loss weight with gastric bypass  . Cancer ([place])     vulvar  . Anemia     blood transfusion  . Hypertension     no longer on medication  . Anxiety   . Depression   . History of stomach ulcers     Social History  Substance Use Topics  . Smoking status: Former Smoker -- 1.00 packs/day for 30 years    Types: Cigarettes    Quit date:  07/26/2015  . Smokeless tobacco: Never Used  . Alcohol Use: No    Family History  Problem Relation Age of Onset  . Cancer Father   . Hypertension Father   . Lung cancer Father    Allergies  Allergen Reactions  . Carafate [Sucralfate] Other (See Comments)    unspecified  . Ciprocinonide [Fluocinolone] Hives and Rash  . Ciprofloxacin Other (See Comments)    Blisters, boils  . Dust Mite Extract Itching  . Iohexol Hives       . Nsaids Other (See Comments)    Due to Hx of gastric bypass    OBJECTIVE: Filed Vitals:   09/13/15 1407 09/13/15 1453 09/13/15 2118 09/14/15 0548  BP: 142/85 148/81 138/86 142/83  Pulse: 91 90 91 91  Temp: 98 F (36.7 C) 98.2 F (36.8 C) 98 F (36.7 C) 97.8 F (36.6 C)  TempSrc: Oral Oral Oral Oral  Resp: 20 17 18 20   Height:      Weight:      SpO2: 99% 100% 98% 98%   Body mass index is 18.13 kg/(m^2).  Physical Exam  Constitutional:  She is more alert and is  able to answer some questions.  HENT:  The erythema and swelling over her left parotid gland has decreased slightly since yesterday.  Cardiovascular: Normal rate and regular rhythm.   No murmur heard. Pulmonary/Chest: Effort normal and breath sounds normal.    Lab Results Lab Results  Component Value Date   WBC 25.2* 09/14/2015   HGB 9.3* 09/14/2015   HCT 27.4* 09/14/2015   MCV 81.5 09/14/2015   PLT 413* 09/14/2015    Lab Results  Component Value Date   CREATININE 1.59* 09/14/2015   BUN 38* 09/14/2015   NA 135 09/14/2015   K 3.2* 09/14/2015   CL 102 09/14/2015   CO2 24 09/14/2015    Lab Results  Component Value Date   ALT 14 09/14/2015   AST 15 09/14/2015   ALKPHOS 283* 09/14/2015   BILITOT 0.6 09/14/2015     Microbiology: Recent Results (from the past 240 hour(s))  Culture, blood (Routine X 2) w Reflex to ID Panel     Status: None (Preliminary result)   Collection Time: 09/16/2015  9:30 PM  Result Value Ref Range Status   Specimen Description BLOOD LEFT ARM   Final   Special Requests BOTTLES DRAWN AEROBIC AND ANAEROBIC 5CC  Final   Culture  Setup Time   Final    GRAM POSITIVE COCCI IN CLUSTERS AEROBIC BOTTLE ONLY CRITICAL RESULT CALLED TO, READ BACK BY AND VERIFIED WITHHenrietta Dine RN 2246 09/13/15 A BROWNING    Culture   Final    STAPHYLOCOCCUS AUREUS SUSCEPTIBILITIES TO FOLLOW    Report Status PENDING  Incomplete  Urine culture     Status: None (Preliminary result)   Collection Time: 09/04/2015 10:22 PM  Result Value Ref Range Status   Specimen Description URINE, CATHETERIZED  Final   Special Requests NONE  Final   Culture TOO YOUNG TO READ  Final   Report Status PENDING  Incomplete  MRSA PCR Screening     Status: Abnormal   Collection Time: 09/13/15  2:21 PM  Result Value Ref Range Status   MRSA by PCR POSITIVE (A) NEGATIVE Final    Comment: RESULT CALLED TO, READ BACK BY AND VERIFIED WITH: Donnal Debar RN 17:25 09/13/15 ([email or web address])        The GeneXpert MRSA Assay (FDA approved for NASAL specimens only), is one component of a comprehensive MRSA colonization surveillance program. It is not intended to diagnose MRSA infection nor to guide or monitor treatment for MRSA infections.      ASSESSMENT: She has staph aureus bacteremia, most likely related to her acute left parotitis. She is improving on empiric anabiotic therapy. I will continue current antibiotics but consider dropping piperacillin tazobactam soon. She needs repeat blood cultures and echocardiogram.  PLAN: 1. Continue current antibiotics for now 2. Repeat blood cultures 3. Echocardiogram  Michel Bickers, MD [facility] for Infectious [place] [phone] pager   [phone] cell 09/14/2015, 1:08 PM

## 2015-09-14 NOTE — Progress Notes (Signed)
Subjective: Still c/o left facial swelling and pain.  Objective: Vital signs in last 24 hours: Temp:  [97.8 F (36.6 C)-98 F (36.7 C)] 97.8 F (36.6 C) (01/13 0548) Pulse Rate:  [91] 91 (01/13 0548) Resp:  [18-20] 20 (01/13 0548) BP: (138-142)/(83-86) 142/83 mmHg (01/13 0548) SpO2:  [98 %] 98 % (01/13 0548)  Physical Exam  Constitutional: She appears cachectic.  Head: Atraumatic.  Ears: Normal auricles and EACs. Nose/Mouth/Throat: Mucous membranes are dry.  Swelling, tenderness of the left parotid gland is noted. Eyes: Conjunctivae are normal. Right eye exhibits no discharge. Left eye exhibits no discharge.  Neck: Normal range of motion. Neck supple.  Cardiovascular: Normal rate, regular rhythm and normal heart sounds.  Pulmonary/Chest: Effort normal and breath sounds normal. No respiratory distress. She has no wheezes.  Skin: Skin is warm and dry.  Psychiatric: She has a normal mood and affect.    Recent Labs  09/13/15 0308 09/14/15 0658  WBC 17.3* 25.2*  HGB 6.1* 9.3*  HCT 20.2* 27.4*  PLT 437* 413*    Recent Labs  09/13/15 0308 09/14/15 0658  NA 133* 135  K 3.4* 3.2*  CL 99* 102  CO2 25 24  GLUCOSE 63* 38*  BUN 40* 38*  CREATININE 1.62* 1.59*  CALCIUM 7.9* 8.1*    Medications:  I have reviewed the patient's current medications. Scheduled: . antiseptic oral rinse  7 mL Mouth Rinse BID  . buPROPion  300 mg Oral Daily  . Chlorhexidine Gluconate Cloth  6 each Topical Q0600  . cholecalciferol  1,000 Units Oral Daily  . feeding supplement  1 Container Oral TID BM  . feeding supplement (ENSURE ENLIVE)  237 mL Oral BID BM  . mupirocin ointment  1 application Nasal BID  . pantoprazole  40 mg Oral Daily  . piperacillin-tazobactam (ZOSYN)  IV  3.375 g Intravenous Q8H  . saccharomyces boulardii  250 mg Oral BID  . vancomycin  500 mg Intravenous Q24H  . vitamin B-12  1,000 mcg Oral Daily   [id number] **OR** acetaminophen, diphenhydrAMINE,  loratadine, ondansetron **OR** ondansetron (ZOFRAN) IV, oxyCODONE, traMADol  Assessment/Plan: Acute left parotitis, likely secondary to dehydration. Continue IV fluid and IV abx. Will follow.   LOS: 2 days   Ryeleigh Santore,SUI W 09/14/2015, 3:15 PM

## 2015-09-14 NOTE — Progress Notes (Signed)
  Echocardiogram 2D Echocardiogram has been performed.  Autumn Turner 09/14/2015, 5:27 PM

## 2015-09-14 NOTE — Evaluation (Signed)
Clinical/Bedside Swallow Evaluation Patient Details  Name: Autumn Turner MRN: [id number] Date of Birth: 12-22-1960  Today's Date: 09/14/2015 Time: SLP Start Time (ACUTE ONLY): 0900 SLP Stop Time (ACUTE ONLY): 0919 SLP Time Calculation (min) (ACUTE ONLY): 19 min  Past Medical History:  Past Medical History  Diagnosis Date  . Abnormal Pap smear   . Vulvar lesion   . CIN II (cervical intraepithelial neoplasia II)   . Malnourished ([place])   . Sleep apnea     Loss weight with gastric bypass  . Cancer ([place])     vulvar  . Anemia     blood transfusion  . Hypertension     no longer on medication  . Anxiety   . Depression   . History of stomach ulcers    Past Surgical History:  Past Surgical History  Procedure Laterality Date  . Cesarean section  1987; 1989  . Gastric bypass open  2001  . Laparoscopic cholecystectomy  2002  . Abdominoplasty  2004    "tummy tuck"  . Leep  06/2009  . Cholecystectomy    . Tonsillectomy and adenoidectomy  1967  . Vulvectomy Bilateral 12/07/2012    Procedure: VULVECTOMY AND BILATERAL INGUINAL LYMPH NODE DISSECTION, PAP SMEAR;  Surgeon: Janie Morning, MD;  Location: [facility] ORS;  Service: Gynecology;  Laterality: Bilateral;  . Revision gastric restrictive procedure for morbid obesity    . Esophagogastroduodenoscopy N/A 06/29/2015    Procedure: ESOPHAGOGASTRODUODENOSCOPY (EGD);  Surgeon: Clarene Essex, MD;  Location: [facility] ENDOSCOPY;  Service: Endoscopy;  Laterality: N/A;  . Balloon dilation N/A 06/29/2015    Procedure: BALLOON DILATION;  Surgeon: Clarene Essex, MD;  Location[facility] ENDOSCOPY;  Service: Endoscopy;  Laterality: N/A;  . Esophagogastroduodenoscopy (egd) with propofol N/A 07/17/2015    Procedure: ESOPHAGOGASTRODUODENOSCOPY (EGD) WITH PROPOFOL;  Surgeon: Clarene Essex, MD;  Location: [facility] ENDOSCOPY;  Service: Endoscopy;  Laterality: N/A;  . Balloon dilation N/A 07/17/2015    Procedure: BALLOON DILATION;  Surgeon: Clarene Essex, MD;  Location: [facility] ENDOSCOPY;  Service:  Endoscopy;  Laterality: N/A;  . Esophageal stent placement N/A 07/17/2015    Procedure: ESOPHAGEAL STENT PLACEMENT;  Surgeon: Clarene Essex, MD;  Location: [facility] ENDOSCOPY;  Service: Endoscopy;  Laterality: N/A;  . Esophagogastroduodenoscopy N/A 08/24/2015    Procedure: ESOPHAGOGASTRODUODENOSCOPY (EGD);  Surgeon: Clarene Essex, MD;  Location: [facility] ENDOSCOPY;  Service: Endoscopy;  Laterality: N/A;   HPI:  55 yo female recently admitted for MRSA PNA, currently staying at SNF, who presents with malnutrition and FTT related to intractable nausea and vomiting, left parotid swelling, and rash.    Assessment / Plan / Recommendation Clinical Impression  Pt's oropharyngeal swallow appears to be [facility]. No overt signs of aspiration noted. She does have frequent eructation and per chart frequent vomiting, which would put her at a higher risk for post-prandial aspiration. Recommend diet advancement to Dys 3 diet per pt preference as she does not have her dentures, continuing thin liquids, as appropriate from a medical standpoint.    Aspiration Risk  Risk for inadequate nutrition/hydration    Diet Recommendation  Dys 3 diet, thin liquids (when medically cleared for advancement)   Medication Administration: Whole meds with liquid    Other  Recommendations Recommended Consults: Consider GI evaluation Oral Care Recommendations: Oral care BID   Follow up Recommendations  None    Frequency and Duration            Prognosis        Swallow Study   General HPI: 55 yo  female recently admitted for MRSA PNA, currently staying at SNF, who presents with malnutrition and FTT related to intractable nausea and vomiting, left parotid swelling, and rash.  Type of Study: Bedside Swallow Evaluation Previous Swallow Assessment: none in chart Diet Prior to this Study: Thin liquids Temperature Spikes Noted: No Respiratory Status: Nasal cannula History of Recent Intubation: No Behavior/Cognition: Alert;Cooperative Oral  Cavity Assessment: Dry Oral Care Completed by SLP: No Oral Cavity - Dentition: Edentulous;Dentures, not available Vision: Functional for self-feeding Self-Feeding Abilities: Able to feed self Patient Positioning: Upright in bed Baseline Vocal Quality: Low vocal intensity    Oral/Motor/Sensory Function Overall Oral Motor/Sensory Function: Generalized oral weakness   Ice Chips Ice chips: Not tested   Thin Liquid Thin Liquid: Within functional limits Presentation: Cup;Straw    Nectar Thick Nectar Thick Liquid: Not tested   Honey Thick Honey Thick Liquid: Not tested   Puree Puree: Within functional limits Presentation: Self Fed;Spoon   Solid   GO   Solid: Not tested       Germain Osgood, M.A. CCC-SLP [phone]  Germain Osgood 09/14/2015,9:35 AM

## 2015-09-14 NOTE — Progress Notes (Signed)
PROGRESS NOTE  Autumn Turner [id number] DOB: 09/30/1960 DOA: 09/02/2015 PCP: Robyn Haber, MD  Assessment/Plan: Staph aureus bacteremia -IV vanc -ID following -echo  Acute renal failure probably from poor oral intake and dehydration -  d5IVF for now  Skin rash: ? Related to MRSA bacteremia  Left Parotid swellinig - ENT following  Anemia - hemoglobin just get baseline. There is any further decline in hemoglobin will transfuse. Will check LDH to rule out any hemolytic process. Check peripheral smear yesterday.  Recent admission for cavitary pneumonia with MRSA  Protein calorie malnutrition with history of gastric bypass - nutrition consult.  Elevated alkaline phosphatase levels - cause not known. Closely observe.  Code Status: full Family Communication: patient Disposition Plan:    Consultants:    Procedures:      HPI/Subjective: Still feeling very weak  Objective: Filed Vitals:   09/13/15 2118 09/14/15 0548  BP: 138/86 142/83  Pulse: 91 91  Temp: 98 F (36.7 C) 97.8 F (36.6 C)  Resp: 18 20    Intake/Output Summary (Last 24 hours) at 09/14/15 1529 Last data filed at 09/13/15 1900  Gross per 24 hour  Intake 1388.33 ml  Output    300 ml  Net 1088.33 ml   Filed Weights   09/13/15 1406  Weight: 47.945 kg (105 lb 11.2 oz)    Exam:   General:  Awake, ill appearing  Cardiovascular: rrr  Respiratory: clear  Abdomen: thin, + BS  Musculoskeletal: no edema- rash on hands clearing, legs still with redness   Data Reviewed: Basic Metabolic Panel:  Recent Labs Lab 09/15/2015 1625 09/13/15 0308 09/14/15 0658  NA 132* 133* 135  K 3.9 3.4* 3.2*  CL 93* 99* 102  CO2 27 25 24   GLUCOSE 65 63* 38*  BUN 42* 40* 38*  CREATININE 1.59* 1.62* 1.59*  CALCIUM 9.1 7.9* 8.1*   Liver Function Tests:  Recent Labs Lab 09/22/2015 1625 09/13/15 0308 09/14/15 0658  AST 19 15 15   ALT 16 12* 14  ALKPHOS 368* 291* 283*  BILITOT 0.5 0.5 0.6  PROT  5.6* 4.4* 4.9*  ALBUMIN 1.2* <1.0* <1.0*    Recent Labs Lab 09/24/2015 1625  LIPASE 14   No results for input(s): AMMONIA in the last 168 hours. CBC:  Recent Labs Lab 09/17/2015 1625 09/13/15 0308 09/14/15 0658  WBC 17.7* 17.3* 25.2*  NEUTROABS  --  15.8*  --   HGB 7.5* 6.1* 9.3*  HCT 25.3* 20.2* 27.4*  MCV 83.2 82.8 81.5  PLT 459* 437* 413*   Cardiac Enzymes:  Recent Labs Lab 09/23/2015 2242  CKTOTAL 15*   BNP (last 3 results) No results for input(s): BNP in the last 8760 hours.  ProBNP (last 3 results) No results for input(s): PROBNP in the last 8760 hours.  CBG:  Recent Labs Lab 09/14/15 0957  GLUCAP 78    Recent Results (from the past 240 hour(s))  Culture, blood (Routine X 2) w Reflex to ID Panel     Status: None (Preliminary result)   Collection Time: 09/08/2015  9:17 PM  Result Value Ref Range Status   Specimen Description BLOOD RIGHT ARM  Final   Special Requests IN PEDIATRIC BOTTLE 2CC  Final   Culture NO GROWTH 1 DAY  Final   Report Status PENDING  Incomplete  Culture, blood (Routine X 2) w Reflex to ID Panel     Status: None (Preliminary result)   Collection Time: 09/06/2015  9:30 PM  Result Value Ref Range Status  Specimen Description BLOOD LEFT ARM  Final   Special Requests BOTTLES DRAWN AEROBIC AND ANAEROBIC 5CC  Final   Culture  Setup Time   Final    GRAM POSITIVE COCCI IN CLUSTERS AEROBIC BOTTLE ONLY CRITICAL RESULT CALLED TO, READ BACK BY AND VERIFIED WITHHenrietta Dine RN 2246 09/13/15 A BROWNING    Culture   Final    STAPHYLOCOCCUS AUREUS SUSCEPTIBILITIES TO FOLLOW    Report Status PENDING  Incomplete  Urine culture     Status: None (Preliminary result)   Collection Time: 09/24/2015 10:22 PM  Result Value Ref Range Status   Specimen Description URINE, CATHETERIZED  Final   Special Requests NONE  Final   Culture 10,000 COLONIES/mL PSEUDOMONAS AERUGINOSA  Final   Report Status PENDING  Incomplete  MRSA PCR Screening     Status: Abnormal    Collection Time: 09/13/15  2:21 PM  Result Value Ref Range Status   MRSA by PCR POSITIVE (A) NEGATIVE Final    Comment: RESULT CALLED TO, READ BACK BY AND VERIFIED WITH: Donnal Debar RN 17:25 09/13/15 ([email or web address])        The GeneXpert MRSA Assay (FDA approved for NASAL specimens only), is one component of a comprehensive MRSA colonization surveillance program. It is not intended to diagnose MRSA infection nor to guide or monitor treatment for MRSA infections.      Studies: Dg Chest Portable 1 View  10/02/2015  CLINICAL DATA:  55 year old female with previous pneumonia and weakness. Further to thrive. EXAM: PORTABLE CHEST 1 VIEW COMPARISON:  Radiograph dated 08/16/2015 FINDINGS: Single-view of the chest demonstrates emphysematous changes of the lungs. Stable appearing right upper lobe cavity again noted. There has been interval improvement of the previously seen airspace opacities bilaterally. Linear densities in the mid lung fields bilaterally likely represent atelectasis/scarring versus less likely residual infiltrate. There is a small right pleural effusion. No pneumothorax. The cardiac silhouette is within normal limits. The osseous structures appear unremarkable. IMPRESSION: Significant interval improvement of the previously seen bilateral airspace opacities. Residual mid lung field densities likely represent atelectasis/scarring versus residual infiltrate. Small right pleural effusion. Electronically Signed   By: Anner Crete M.D.   On: 09/10/2015 22:01    Scheduled Meds: . antiseptic oral rinse  7 mL Mouth Rinse BID  . buPROPion  300 mg Oral Daily  . Chlorhexidine Gluconate Cloth  6 each Topical Q0600  . cholecalciferol  1,000 Units Oral Daily  . feeding supplement  1 Container Oral TID BM  . feeding supplement (ENSURE ENLIVE)  237 mL Oral BID BM  . mupirocin ointment  1 application Nasal BID  . pantoprazole  40 mg Oral Daily  . piperacillin-tazobactam (ZOSYN)  IV  3.375 g  Intravenous Q8H  . saccharomyces boulardii  250 mg Oral BID  . vancomycin  500 mg Intravenous Q24H  . vitamin B-12  1,000 mcg Oral Daily   Continuous Infusions: . dextrose 5 % and 0.9% NaCl 75 mL/hr at 09/14/15 1147   Antibiotics Given (last 72 hours)    Date/Time Action Medication Dose Rate   09/13/15 1545 Given  [barcode faded. verified medication]   acyclovir (ZOVIRAX) 250 mg in dextrose 5 % 100 mL IVPB 250 mg 105 mL/hr   09/13/15 2126 Given   vancomycin (VANCOCIN) 500 mg in sodium chloride 0.9 % 100 mL IVPB 500 mg 100 mL/hr   09/14/15 0057 Given  [IV pump was giving problems]   acyclovir (ZOVIRAX) 250 mg in dextrose 5 % 100 mL IVPB 250  mg 105 mL/hr   09/14/15 0226 Given   piperacillin-tazobactam (ZOSYN) IVPB 3.375 g 3.375 g 12.5 mL/hr   09/14/15 1343 Given   piperacillin-tazobactam (ZOSYN) IVPB 3.375 g 3.375 g 12.5 mL/hr      Principal Problem:   AKI (acute kidney injury) ([place]) Active Problems:   Protein-calorie malnutrition, severe   Pressure ulcer   Leukocytoclastic vasculitis ([facility])   Acute parotitis   ARF (acute renal failure) ([place])   Staphylococcus aureus bacteremia    Time spent: 35 min    [place] [facility] Pager [phone]. If 7PM-7AM, please contact night-coverage at [email or web address], password [facility] 09/14/2015, 3:29 PM  LOS: 2 days

## 2015-09-15 DIAGNOSIS — L8990 Pressure ulcer of unspecified site, unspecified stage: Secondary | ICD-10-CM

## 2015-09-15 DIAGNOSIS — L959 Vasculitis limited to the skin, unspecified: Secondary | ICD-10-CM

## 2015-09-15 DIAGNOSIS — A4902 Methicillin resistant Staphylococcus aureus infection, unspecified site: Secondary | ICD-10-CM

## 2015-09-15 DIAGNOSIS — I340 Nonrheumatic mitral (valve) insufficiency: Secondary | ICD-10-CM

## 2015-09-15 LAB — CBC
HCT: 26.9 % — ABNORMAL LOW (ref 36.0–46.0)
HEMOGLOBIN: 8.6 g/dL — AB (ref 12.0–15.0)
MCH: 27 pg (ref 26.0–34.0)
MCHC: 32 g/dL (ref 30.0–36.0)
MCV: 84.3 fL (ref 78.0–100.0)
Platelets: 375 10*3/uL (ref 150–400)
RBC: 3.19 MIL/uL — ABNORMAL LOW (ref 3.87–5.11)
RDW: 16.6 % — AB (ref 11.5–15.5)
WBC: 15 10*3/uL — ABNORMAL HIGH (ref 4.0–10.5)

## 2015-09-15 LAB — COMPREHENSIVE METABOLIC PANEL
ALK PHOS: 287 U/L — AB (ref 38–126)
ALT: 13 U/L — AB (ref 14–54)
ANION GAP: 11 (ref 5–15)
AST: 21 U/L (ref 15–41)
BILIRUBIN TOTAL: 1 mg/dL (ref 0.3–1.2)
BUN: 32 mg/dL — AB (ref 6–20)
CALCIUM: 8.2 mg/dL — AB (ref 8.9–10.3)
CO2: 24 mmol/L (ref 22–32)
Chloride: 100 mmol/L — ABNORMAL LOW (ref 101–111)
Creatinine, Ser: 1.58 mg/dL — ABNORMAL HIGH (ref 0.44–1.00)
GFR calc Af Amer: 41 mL/min — ABNORMAL LOW (ref >60)
GFR calc non Af Amer: 36 mL/min — ABNORMAL LOW (ref >60)
GLUCOSE: 54 mg/dL — AB (ref 65–99)
Potassium: 3.5 mmol/L (ref 3.5–5.1)
SODIUM: 135 mmol/L (ref 135–145)
TOTAL PROTEIN: 4.6 g/dL — AB (ref 6.5–8.1)

## 2015-09-15 LAB — URINE CULTURE

## 2015-09-15 LAB — PHOSPHORUS: Phosphorus: 4.1 mg/dL (ref 2.5–4.6)

## 2015-09-15 LAB — CULTURE, BLOOD (ROUTINE X 2)

## 2015-09-15 LAB — GLUCOSE, CAPILLARY: GLUCOSE-CAPILLARY: 83 mg/dL (ref 65–99)

## 2015-09-15 LAB — MAGNESIUM: Magnesium: 1.4 mg/dL — ABNORMAL LOW (ref 1.7–2.4)

## 2015-09-15 MED ORDER — MAGNESIUM SULFATE 2 GM/50ML IV SOLN
2.0000 g | Freq: Once | INTRAVENOUS | Status: AC
  Administered 2015-09-15: 2 g via INTRAVENOUS
  Filled 2015-09-15: qty 50

## 2015-09-15 NOTE — Progress Notes (Signed)
PROGRESS NOTE  Autumn Turner [id number] DOB: [birth year] DOA: 09/04/2015 PCP: Robyn Haber, MD  Assessment/Plan: Staph aureus bacteremia -IV vanc -ID following -echo-- not sure if TEE can be done with facial swelling plus patient had gastric bypass and a revision -blood cultures repeated  Acute renal failure probably from poor oral intake and dehydration -  d5IVF   Skin rash: ? Related to MRSA bacteremia  Left Parotid swellinig - ENT following IVF IV abx  Anemia - trend Transfuse for <7  Recent admission for cavitary pneumonia with MRSA  Protein calorie malnutrition with history of gastric bypass - nutrition consult. -patient is going to try to eat better -does NOT want NG tube -GI doc has mentioned J tube in past  Elevated alkaline phosphatase levels - cause not known. Closely observe.  pseudomonas in urine culture -<10,000 colonies -defer to ID  Husband brought up prognosis and how he and his children have discussed hospice/end of life nearing Will get palliative care consult   Code Status: full Family Communication: patient/husband at beside Disposition Plan:    Consultants:  ID  Palliative care  PT  Procedures:      HPI/Subjective: Says she is eating better and jaw does not hurt as much  Objective: Filed Vitals:   09/15/15 0549 09/15/15 1230  BP: 140/85 142/80  Pulse: 88 92  Temp: 98.4 F (36.9 C) 98.3 F (36.8 C)  Resp: 16 18    Intake/Output Summary (Last 24 hours) at 09/15/15 1612 Last data filed at 09/15/15 1230  Gross per 24 hour  Intake   1050 ml  Output    252 ml  Net    798 ml   Filed Weights   09/13/15 1406  Weight: 47.945 kg (105 lb 11.2 oz)    Exam:   General:  Awake, ill appearing  Cardiovascular: rrr  Respiratory: clear  Abdomen: thin, + BS  Musculoskeletal: + edema  Data Reviewed: Basic Metabolic Panel:  Recent Labs Lab 10/02/2015 1625 09/13/15 0308 09/14/15 0658 09/15/15 0605  NA 132*  133* 135 135  K 3.9 3.4* 3.2* 3.5  CL 93* 99* 102 100*  CO2 27 25 24 24   GLUCOSE 65 63* 38* 54*  BUN 42* 40* 38* 32*  CREATININE 1.59* 1.62* 1.59* 1.58*  CALCIUM 9.1 7.9* 8.1* 8.2*  MG  --   --   --  1.4*  PHOS  --   --   --  4.1   Liver Function Tests:  Recent Labs Lab 09/17/2015 1625 09/13/15 0308 09/14/15 0658 09/15/15 0605  AST 19 15 15 21   ALT 16 12* 14 13*  ALKPHOS 368* 291* 283* 287*  BILITOT 0.5 0.5 0.6 1.0  PROT 5.6* 4.4* 4.9* 4.6*  ALBUMIN 1.2* <1.0* <1.0* <1.0*    Recent Labs Lab 10/01/2015 1625  LIPASE 14   No results for input(s): AMMONIA in the last 168 hours. CBC:  Recent Labs Lab 09/28/2015 1625 09/13/15 0308 09/14/15 0658 09/15/15 0605  WBC 17.7* 17.3* 25.2* 15.0*  NEUTROABS  --  15.8*  --   --   HGB 7.5* 6.1* 9.3* 8.6*  HCT 25.3* 20.2* 27.4* 26.9*  MCV 83.2 82.8 81.5 84.3  PLT 459* 437* 413* 375   Cardiac Enzymes:  Recent Labs Lab 09/11/2015 2242  CKTOTAL 15*   BNP (last 3 results) No results for input(s): BNP in the last 8760 hours.  ProBNP (last 3 results) No results for input(s): PROBNP in the last 8760 hours.  CBG:  Recent Labs Lab  09/14/15 0957 09/15/15 0847  GLUCAP 78 83    Recent Results (from the past 240 hour(s))  Culture, blood (Routine X 2) w Reflex to ID Panel     Status: None (Preliminary result)   Collection Time: 09/11/2015  9:17 PM  Result Value Ref Range Status   Specimen Description BLOOD RIGHT ARM  Final   Special Requests IN PEDIATRIC BOTTLE 2CC  Final   Culture NO GROWTH 3 DAYS  Final   Report Status PENDING  Incomplete  Culture, blood (Routine X 2) w Reflex to ID Panel     Status: None   Collection Time: 09/22/2015  9:30 PM  Result Value Ref Range Status   Specimen Description BLOOD LEFT ARM  Final   Special Requests BOTTLES DRAWN AEROBIC AND ANAEROBIC 5CC  Final   Culture  Setup Time   Final    GRAM POSITIVE COCCI IN CLUSTERS AEROBIC BOTTLE ONLY CRITICAL RESULT CALLED TO, READ BACK BY AND VERIFIED WITHHenrietta Dine RN 2246 09/13/15 A BROWNING    Culture METHICILLIN RESISTANT STAPHYLOCOCCUS AUREUS  Final   Report Status 09/15/2015 FINAL  Final   Organism ID, Bacteria METHICILLIN RESISTANT STAPHYLOCOCCUS AUREUS  Final      Susceptibility   Methicillin resistant staphylococcus aureus - MIC*    CIPROFLOXACIN <=0.5 SENSITIVE Sensitive     ERYTHROMYCIN >=8 RESISTANT Resistant     GENTAMICIN <=0.5 SENSITIVE Sensitive     OXACILLIN >=4 RESISTANT Resistant     TETRACYCLINE <=1 SENSITIVE Sensitive     VANCOMYCIN 1 SENSITIVE Sensitive     TRIMETH/SULFA <=10 SENSITIVE Sensitive     CLINDAMYCIN <=0.25 SENSITIVE Sensitive     RIFAMPIN <=0.5 SENSITIVE Sensitive     Inducible Clindamycin NEGATIVE Sensitive     * METHICILLIN RESISTANT STAPHYLOCOCCUS AUREUS  Urine culture     Status: None   Collection Time: 09/11/2015 10:22 PM  Result Value Ref Range Status   Specimen Description URINE, CATHETERIZED  Final   Special Requests NONE  Final   Culture 10,000 COLONIES/mL PSEUDOMONAS AERUGINOSA  Final   Report Status 09/15/2015 FINAL  Final   Organism ID, Bacteria PSEUDOMONAS AERUGINOSA  Final      Susceptibility   Pseudomonas aeruginosa - MIC*    CEFTAZIDIME 4 SENSITIVE Sensitive     CIPROFLOXACIN <=0.25 SENSITIVE Sensitive     GENTAMICIN 2 SENSITIVE Sensitive     IMIPENEM 2 SENSITIVE Sensitive     PIP/TAZO 8 SENSITIVE Sensitive     CEFEPIME 4 SENSITIVE Sensitive     * 10,000 COLONIES/mL PSEUDOMONAS AERUGINOSA  MRSA PCR Screening     Status: Abnormal   Collection Time: 09/13/15  2:21 PM  Result Value Ref Range Status   MRSA by PCR POSITIVE (A) NEGATIVE Final    Comment: RESULT CALLED TO, READ BACK BY AND VERIFIED WITH: Donnal Debar RN 17:25 09/13/15 ([email or web address])        The GeneXpert MRSA Assay (FDA approved for NASAL specimens only), is one component of a comprehensive MRSA colonization surveillance program. It is not intended to diagnose MRSA infection nor to guide or monitor treatment for MRSA  infections.   Culture, blood (routine x 2)     Status: None (Preliminary result)   Collection Time: 09/14/15  2:36 PM  Result Value Ref Range Status   Specimen Description BLOOD LEFT HAND  Final   Special Requests BOTTLES DRAWN AEROBIC AND ANAEROBIC 10CC  Final   Culture NO GROWTH < 24 HOURS  Final   Report Status PENDING  Incomplete  Culture, blood (routine x 2)     Status: None (Preliminary result)   Collection Time: 09/14/15  2:39 PM  Result Value Ref Range Status   Specimen Description BLOOD RIGHT HAND  Final   Special Requests BOTTLES DRAWN AEROBIC AND ANAEROBIC 10CC  Final   Culture NO GROWTH < 24 HOURS  Final   Report Status PENDING  Incomplete     Studies: No results found.  Scheduled Meds: . antiseptic oral rinse  7 mL Mouth Rinse BID  . buPROPion  300 mg Oral Daily  . Chlorhexidine Gluconate Cloth  6 each Topical Q0600  . cholecalciferol  1,000 Units Oral Daily  . feeding supplement  1 Container Oral BID BM  . feeding supplement (ENSURE ENLIVE)  237 mL Oral BID BM  . magnesium sulfate 1 - 4 g bolus IVPB  2 g Intravenous Once  . multivitamin with minerals  1 tablet Oral Daily  . mupirocin ointment  1 application Nasal BID  . pantoprazole  40 mg Oral Daily  . piperacillin-tazobactam (ZOSYN)  IV  3.375 g Intravenous Q8H  . saccharomyces boulardii  250 mg Oral BID  . vancomycin  500 mg Intravenous Q24H  . vitamin B-12  1,000 mcg Oral Daily   Continuous Infusions: . dextrose 5 % and 0.9 % NaCl with KCl 20 mEq/L 75 mL/hr at 09/15/15 1437   Antibiotics Given (last 72 hours)    Date/Time Action Medication Dose Rate   09/13/15 1545 Given  [barcode faded. verified medication]   acyclovir (ZOVIRAX) 250 mg in dextrose 5 % 100 mL IVPB 250 mg 105 mL/hr   09/13/15 2126 Given   vancomycin (VANCOCIN) 500 mg in sodium chloride 0.9 % 100 mL IVPB 500 mg 100 mL/hr   09/14/15 0057 Given  [IV pump was giving problems]   acyclovir (ZOVIRAX) 250 mg in dextrose 5 % 100 mL IVPB 250 mg  105 mL/hr   09/14/15 0226 Given   piperacillin-tazobactam (ZOSYN) IVPB 3.375 g 3.375 g 12.5 mL/hr   09/14/15 1343 Given   piperacillin-tazobactam (ZOSYN) IVPB 3.375 g 3.375 g 12.5 mL/hr   09/14/15 1739 Given   piperacillin-tazobactam (ZOSYN) IVPB 3.375 g 3.375 g 12.5 mL/hr   09/15/15 0101 Given   piperacillin-tazobactam (ZOSYN) IVPB 3.375 g 3.375 g 12.5 mL/hr   09/15/15 [id number] Given   vancomycin (VANCOCIN) 500 mg in sodium chloride 0.9 % 100 mL IVPB 500 mg 100 mL/hr   09/15/15 1400 Given   piperacillin-tazobactam (ZOSYN) IVPB 3.375 g 3.375 g 12.5 mL/hr      Principal Problem:   AKI (acute kidney injury) ([place]) Active Problems:   Protein-calorie malnutrition, severe   Pressure ulcer   Leukocytoclastic vasculitis ([place])   Acute parotitis   ARF (acute renal failure) ([place])   Staphylococcus aureus bacteremia   Dehydration    Time spent: 35 min    [facility] Pager [phone]. If 7PM-7AM, please contact night-coverage at [email or web address], password [facility] 09/15/2015, 4:12 PM  LOS: 3 days

## 2015-09-15 NOTE — Progress Notes (Signed)
Pt refused CHG bath this morning. RN educated pt about importance and purpose of CHG bath. Pt continued to refuse CHG bath. Will continue to monitor pt.

## 2015-09-15 NOTE — Progress Notes (Signed)
Patient ID: Autumn Turner, female   DOB: 06-Jun-1961, 55 y.o.   MRN: [id number]         [facility]    Date of Admission:  09/11/2015           Day 4 vancomycin        Day 4 piperacillin tazobactam  Principal Problem:   AKI (acute kidney injury) ([place]) Active Problems:   Protein-calorie malnutrition, severe   Acute parotitis   Staphylococcus aureus bacteremia   Pressure ulcer   Leukocytoclastic vasculitis ([facility])   ARF (acute renal failure) ([facility])   Dehydration   . antiseptic oral rinse  7 mL Mouth Rinse BID  . buPROPion  300 mg Oral Daily  . Chlorhexidine Gluconate Cloth  6 each Topical Q0600  . cholecalciferol  1,000 Units Oral Daily  . feeding supplement  1 Container Oral BID BM  . feeding supplement (ENSURE ENLIVE)  237 mL Oral BID BM  . magnesium sulfate 1 - 4 g bolus IVPB  2 g Intravenous Once  . multivitamin with minerals  1 tablet Oral Daily  . mupirocin ointment  1 application Nasal BID  . pantoprazole  40 mg Oral Daily  . piperacillin-tazobactam (ZOSYN)  IV  3.375 g Intravenous Q8H  . saccharomyces boulardii  250 mg Oral BID  . vancomycin  500 mg Intravenous Q24H  . vitamin B-12  1,000 mcg Oral Daily    SUBJECTIVE: She is feeling a little bit better. She has been having terrible dry mouth. She feels like her left facial pain is slightly improved. Her appetite is better and she has eaten more in the past 24 hours than in the past several weeks.  Review of Systems: Review of Systems  Constitutional: Negative for fever and chills.  Respiratory: Negative for cough and sputum production.   Cardiovascular: Negative for chest pain.  Skin: Positive for rash.    Past Medical History  Diagnosis Date  . Abnormal Pap smear   . Vulvar lesion   . CIN II (cervical intraepithelial neoplasia II)   . Malnourished ([place])   . Sleep apnea     Loss weight with gastric bypass  . Cancer ([place])     vulvar  . Anemia     blood transfusion  .  Hypertension     no longer on medication  . Anxiety   . Depression   . History of stomach ulcers     Social History  Substance Use Topics  . Smoking status: Former Smoker -- 1.00 packs/day for 30 years    Types: Cigarettes    Quit date: 07/26/2015  . Smokeless tobacco: Never Used  . Alcohol Use: No    Family History  Problem Relation Age of Onset  . Cancer Father   . Hypertension Father   . Lung cancer Father    Allergies  Allergen Reactions  . Carafate [Sucralfate] Other (See Comments)    unspecified  . Ciprocinonide [Fluocinolone] Hives and Rash  . Ciprofloxacin Other (See Comments)    Blisters, boils  . Dust Mite Extract Itching  . Iohexol Hives       . Nsaids Other (See Comments)    Due to Hx of gastric bypass    OBJECTIVE: Filed Vitals:   09/14/15 0548 09/14/15 2217 09/15/15 0549 09/15/15 1230  BP: 142/83 144/91 140/85 142/80  Pulse: 91 93 88 92  Temp: 97.8 F (36.6 C) 98.5 F (36.9 C) 98.4 F (36.9 C) 98.3 F (36.8 C)  TempSrc: Oral Oral Oral Oral  Resp: 20 18 16 18   Height:      Weight:      SpO2: 98% 96% 97% 97%   Body mass index is 18.13 kg/(m^2).  Physical Exam  Constitutional:  She is more alert and talkative. Her daughter is at the bedside.  HENT:  The erythema over her left cheek is diminishing but the swelling is definitely worse today. There is no fluctuance. She is moderately tender to palpation. Oral mucous membranes are quite dry.  Eyes: Conjunctivae are normal.  Cardiovascular: Normal rate and regular rhythm.   No murmur heard. Pulmonary/Chest: Effort normal and breath sounds normal.  Skin: Rash noted.  Her vasculitic rash is fading on her hands. It is unchanged on her lower extremities.  Psychiatric: Mood and affect normal.    Lab Results Lab Results  Component Value Date   WBC 15.0* 09/15/2015   HGB 8.6* 09/15/2015   HCT 26.9* 09/15/2015   MCV 84.3 09/15/2015   PLT 375 09/15/2015    Lab Results  Component Value Date     CREATININE 1.58* 09/15/2015   BUN 32* 09/15/2015   NA 135 09/15/2015   K 3.5 09/15/2015   CL 100* 09/15/2015   CO2 24 09/15/2015    Lab Results  Component Value Date   ALT 13* 09/15/2015   AST 21 09/15/2015   ALKPHOS 287* 09/15/2015   BILITOT 1.0 09/15/2015     Microbiology: Recent Results (from the past 240 hour(s))  Culture, blood (Routine X 2) w Reflex to ID Panel     Status: None (Preliminary result)   Collection Time: 09/02/2015  9:17 PM  Result Value Ref Range Status   Specimen Description BLOOD RIGHT ARM  Final   Special Requests IN PEDIATRIC BOTTLE 2CC  Final   Culture NO GROWTH 3 DAYS  Final   Report Status PENDING  Incomplete  Culture, blood (Routine X 2) w Reflex to ID Panel     Status: None   Collection Time: 09/03/2015  9:30 PM  Result Value Ref Range Status   Specimen Description BLOOD LEFT ARM  Final   Special Requests BOTTLES DRAWN AEROBIC AND ANAEROBIC 5CC  Final   Culture  Setup Time   Final    GRAM POSITIVE COCCI IN CLUSTERS AEROBIC BOTTLE ONLY CRITICAL RESULT CALLED TO, READ BACK BY AND VERIFIED WITHHenrietta Dine RN 2246 09/13/15 A BROWNING    Culture METHICILLIN RESISTANT STAPHYLOCOCCUS AUREUS  Final   Report Status 09/15/2015 FINAL  Final   Organism ID, Bacteria METHICILLIN RESISTANT STAPHYLOCOCCUS AUREUS  Final      Susceptibility   Methicillin resistant staphylococcus aureus - MIC*    CIPROFLOXACIN <=0.5 SENSITIVE Sensitive     ERYTHROMYCIN >=8 RESISTANT Resistant     GENTAMICIN <=0.5 SENSITIVE Sensitive     OXACILLIN >=4 RESISTANT Resistant     TETRACYCLINE <=1 SENSITIVE Sensitive     VANCOMYCIN 1 SENSITIVE Sensitive     TRIMETH/SULFA <=10 SENSITIVE Sensitive     CLINDAMYCIN <=0.25 SENSITIVE Sensitive     RIFAMPIN <=0.5 SENSITIVE Sensitive     Inducible Clindamycin NEGATIVE Sensitive     * METHICILLIN RESISTANT STAPHYLOCOCCUS AUREUS  Urine culture     Status: None   Collection Time: 09/11/2015 10:22 PM  Result Value Ref Range Status   Specimen  Description URINE, CATHETERIZED  Final   Special Requests NONE  Final   Culture 10,000 COLONIES/mL PSEUDOMONAS AERUGINOSA  Final   Report Status 09/15/2015 FINAL  Final  Organism ID, Bacteria PSEUDOMONAS AERUGINOSA  Final      Susceptibility   Pseudomonas aeruginosa - MIC*    CEFTAZIDIME 4 SENSITIVE Sensitive     CIPROFLOXACIN <=0.25 SENSITIVE Sensitive     GENTAMICIN 2 SENSITIVE Sensitive     IMIPENEM 2 SENSITIVE Sensitive     PIP/TAZO 8 SENSITIVE Sensitive     CEFEPIME 4 SENSITIVE Sensitive     * 10,000 COLONIES/mL PSEUDOMONAS AERUGINOSA  MRSA PCR Screening     Status: Abnormal   Collection Time: 09/13/15  2:21 PM  Result Value Ref Range Status   MRSA by PCR POSITIVE (A) NEGATIVE Final    Comment: RESULT CALLED TO, READ BACK BY AND VERIFIED WITH: Donnal Debar RN 17:25 09/13/15 ([email or web address])        The GeneXpert MRSA Assay (FDA approved for NASAL specimens only), is one component of a comprehensive MRSA colonization surveillance program. It is not intended to diagnose MRSA infection nor to guide or monitor treatment for MRSA infections.   Culture, blood (routine x 2)     Status: None (Preliminary result)   Collection Time: 09/14/15  2:36 PM  Result Value Ref Range Status   Specimen Description BLOOD LEFT HAND  Final   Special Requests BOTTLES DRAWN AEROBIC AND ANAEROBIC 10CC  Final   Culture NO GROWTH < 24 HOURS  Final   Report Status PENDING  Incomplete  Culture, blood (routine x 2)     Status: None (Preliminary result)   Collection Time: 09/14/15  2:39 PM  Result Value Ref Range Status   Specimen Description BLOOD RIGHT HAND  Final   Special Requests BOTTLES DRAWN AEROBIC AND ANAEROBIC 10CC  Final   Culture NO GROWTH < 24 HOURS  Final   Report Status PENDING  Incomplete     ASSESSMENT: Overall she is better but I am concerned about her continued parotid swelling. If this is no better overnight I would obtain a CT scan to look for evidence of suppurative parotitis and  duct obstruction. I will continue vancomycin and piperacillin tazobactam. This will cover her MRSA bacteremia but also taken to account the probability that she could have polymicrobial infection of her parotid gland. Her repeat blood culture is negative so far. Her transthoracic echocardiogram did not reveal any obvious vegetations but she does have a very thickened mitral valve and moderate mitral regurgitation. She will need to have a TEE next week.  PLAN: 1. Continue current antibiotics for now 2. Await results of repeat blood cultures 3. Possible CT scan of head and neck in a.m.  Michel Bickers, MD [facility] [place] [facility] [phone] pager   [phone] cell 09/15/2015, 5:59 PM

## 2015-09-15 NOTE — Progress Notes (Signed)
Subjective: Pt c/o persistent left facial swelling and pain.  Objective: Vital signs in last 24 hours: Temp:  [98.4 F (36.9 C)-98.5 F (36.9 C)] 98.4 F (36.9 C) (01/14 0549) Pulse Rate:  [88-93] 88 (01/14 0549) Resp:  [16-18] 16 (01/14 0549) BP: (140-144)/(85-91) 140/85 mmHg (01/14 0549) SpO2:  [96 %-97 %] 97 % (01/14 0549)  Physical Exam  Constitutional: She appears cachectic.  Head: Atraumatic.  Ears: Normal auricles and EACs. Nose/Mouth/Throat: Mucous membranes are dry.  Swelling, tenderness of the left parotid gland is noted. Eyes: Conjunctivae are normal. Right eye exhibits no discharge. Left eye exhibits no discharge.  Neck: Normal range of motion. Neck supple.  Cardiovascular: Normal rate, regular rhythm and normal heart sounds.  Pulmonary/Chest: Effort normal and breath sounds normal. No respiratory distress. She has no wheezes.  Skin: Skin is warm and dry.  Psychiatric: She has a normal mood and affect.   Recent Labs  09/14/15 0658 09/15/15 0605  WBC 25.2* 15.0*  HGB 9.3* 8.6*  HCT 27.4* 26.9*  PLT 413* 375    Recent Labs  09/14/15 0658 09/15/15 0605  NA 135 135  K 3.2* 3.5  CL 102 100*  CO2 24 24  GLUCOSE 38* 54*  BUN 38* 32*  CREATININE 1.59* 1.58*  CALCIUM 8.1* 8.2*    Medications:  I have reviewed the patient's current medications. Scheduled: . antiseptic oral rinse  7 mL Mouth Rinse BID  . buPROPion  300 mg Oral Daily  . Chlorhexidine Gluconate Cloth  6 each Topical Q0600  . cholecalciferol  1,000 Units Oral Daily  . feeding supplement  1 Container Oral BID BM  . feeding supplement (ENSURE ENLIVE)  237 mL Oral BID BM  . multivitamin with minerals  1 tablet Oral Daily  . mupirocin ointment  1 application Nasal BID  . pantoprazole  40 mg Oral Daily  . piperacillin-tazobactam (ZOSYN)  IV  3.375 g Intravenous Q8H  . saccharomyces boulardii  250 mg Oral BID  . vancomycin  500 mg Intravenous Q24H  . vitamin B-12  1,000 mcg Oral Daily    [id number] **OR** acetaminophen, diphenhydrAMINE, loratadine, ondansetron **OR** ondansetron (ZOFRAN) IV, oxyCODONE, traMADol  Assessment/Plan: Acute left parotitis, likely secondary to dehydration. WBC has decreased. Continue IV fluid and IV abx. Will follow.   LOS: 3 days   Autumn Turner,SUI W 09/15/2015, 9:27 AM

## 2015-09-16 DIAGNOSIS — R112 Nausea with vomiting, unspecified: Secondary | ICD-10-CM

## 2015-09-16 DIAGNOSIS — Z515 Encounter for palliative care: Secondary | ICD-10-CM | POA: Insufficient documentation

## 2015-09-16 DIAGNOSIS — M310 Hypersensitivity angiitis: Secondary | ICD-10-CM

## 2015-09-16 DIAGNOSIS — B965 Pseudomonas (aeruginosa) (mallei) (pseudomallei) as the cause of diseases classified elsewhere: Secondary | ICD-10-CM

## 2015-09-16 DIAGNOSIS — R6884 Jaw pain: Secondary | ICD-10-CM | POA: Insufficient documentation

## 2015-09-16 DIAGNOSIS — Z7189 Other specified counseling: Secondary | ICD-10-CM | POA: Insufficient documentation

## 2015-09-16 LAB — CBC
HEMATOCRIT: 26.2 % — AB (ref 36.0–46.0)
HEMOGLOBIN: 8.5 g/dL — AB (ref 12.0–15.0)
MCH: 27.4 pg (ref 26.0–34.0)
MCHC: 32.4 g/dL (ref 30.0–36.0)
MCV: 84.5 fL (ref 78.0–100.0)
Platelets: 322 10*3/uL (ref 150–400)
RBC: 3.1 MIL/uL — ABNORMAL LOW (ref 3.87–5.11)
RDW: 16.6 % — AB (ref 11.5–15.5)
WBC: 8.1 10*3/uL (ref 4.0–10.5)

## 2015-09-16 LAB — BASIC METABOLIC PANEL
ANION GAP: 7 (ref 5–15)
BUN: 24 mg/dL — AB (ref 6–20)
CHLORIDE: 108 mmol/L (ref 101–111)
CO2: 26 mmol/L (ref 22–32)
Calcium: 8.1 mg/dL — ABNORMAL LOW (ref 8.9–10.3)
Creatinine, Ser: 1.45 mg/dL — ABNORMAL HIGH (ref 0.44–1.00)
GFR calc Af Amer: 46 mL/min — ABNORMAL LOW (ref >60)
GFR, EST NON AFRICAN AMERICAN: 39 mL/min — AB (ref >60)
GLUCOSE: 77 mg/dL (ref 65–99)
POTASSIUM: 2.8 mmol/L — AB (ref 3.5–5.1)
Sodium: 141 mmol/L (ref 135–145)

## 2015-09-16 LAB — MAGNESIUM: Magnesium: 1.9 mg/dL (ref 1.7–2.4)

## 2015-09-16 LAB — PHOSPHORUS: PHOSPHORUS: 3.8 mg/dL (ref 2.5–4.6)

## 2015-09-16 LAB — POTASSIUM: Potassium: 3.2 mmol/L — ABNORMAL LOW (ref 3.5–5.1)

## 2015-09-16 LAB — VANCOMYCIN, TROUGH: VANCOMYCIN TR: 20 ug/mL (ref 10.0–20.0)

## 2015-09-16 MED ORDER — MORPHINE SULFATE (PF) 2 MG/ML IV SOLN
2.0000 mg | INTRAVENOUS | Status: DC | PRN
  Administered 2015-09-17: 2 mg via INTRAVENOUS
  Filled 2015-09-16: qty 1

## 2015-09-16 MED ORDER — BENZOCAINE 10 % MT GEL
Freq: Four times a day (QID) | OROMUCOSAL | Status: DC | PRN
  Filled 2015-09-16: qty 9.4

## 2015-09-16 MED ORDER — POTASSIUM CHLORIDE 10 MEQ/100ML IV SOLN
10.0000 meq | INTRAVENOUS | Status: AC
  Administered 2015-09-16 (×3): 10 meq via INTRAVENOUS
  Filled 2015-09-16 (×3): qty 100

## 2015-09-16 NOTE — Progress Notes (Signed)
Patient ID: Autumn Turner, female   DOB: [birth year], 57 y.o.   MRN: [id number]         [facility]    Date of Admission:  09/05/2015           Day 5 vancomycin        Day 5 piperacillin tazobactam  Principal Problem:   AKI (acute kidney injury) ([place]) Active Problems:   Protein-calorie malnutrition, severe   Acute parotitis   Staphylococcus aureus bacteremia   Pressure ulcer   Leukocytoclastic vasculitis ([place])   Hypokalemia   ARF (acute renal failure) ([place])   Dehydration   Encounter for palliative care   Jaw pain   Goals of care, counseling/discussion   . antiseptic oral rinse  7 mL Mouth Rinse BID  . buPROPion  300 mg Oral Daily  . Chlorhexidine Gluconate Cloth  6 each Topical Q0600  . cholecalciferol  1,000 Units Oral Daily  . feeding supplement  1 Container Oral BID BM  . feeding supplement (ENSURE ENLIVE)  237 mL Oral BID BM  . multivitamin with minerals  1 tablet Oral Daily  . mupirocin ointment  1 application Nasal BID  . pantoprazole  40 mg Oral Daily  . piperacillin-tazobactam (ZOSYN)  IV  3.375 g Intravenous Q8H  . saccharomyces boulardii  250 mg Oral BID  . vancomycin  500 mg Intravenous Q24H  . vitamin B-12  1,000 mcg Oral Daily    SUBJECTIVE: She has been bothered by nausea over the past 24 hours and has had a few episodes of vomiting but otherwise is feeling better. Her left facial pain is improving.  Review of Systems: Review of Systems  Constitutional: Positive for malaise/fatigue. Negative for fever and chills.  Respiratory: Negative for cough and sputum production.   Cardiovascular: Negative for chest pain.  Gastrointestinal: Positive for nausea and vomiting.  Skin: Positive for rash.    Past Medical History  Diagnosis Date  . Abnormal Pap smear   . Vulvar lesion   . CIN II (cervical intraepithelial neoplasia II)   . Malnourished ([place])   . Sleep apnea     Loss weight with gastric bypass  . Cancer ([place])     vulvar   . Anemia     blood transfusion  . Hypertension     no longer on medication  . Anxiety   . Depression   . History of stomach ulcers     Social History  Substance Use Topics  . Smoking status: Former Smoker -- 1.00 packs/day for 30 years    Types: Cigarettes    Quit date: 07/26/2015  . Smokeless tobacco: Never Used  . Alcohol Use: No    Family History  Problem Relation Age of Onset  . Cancer Father   . Hypertension Father   . Lung cancer Father    Allergies  Allergen Reactions  . Carafate [Sucralfate] Other (See Comments)    unspecified  . Ciprocinonide [Fluocinolone] Hives and Rash  . Ciprofloxacin Other (See Comments)    Blisters, boils  . Dust Mite Extract Itching  . Iohexol Hives       . Nsaids Other (See Comments)    Due to Hx of gastric bypass    OBJECTIVE: Filed Vitals:   09/15/15 1230 09/15/15 2208 09/16/15 0602 09/16/15 1310  BP: 142/80 158/100 149/82 148/83  Pulse: 92 96 76 83  Temp: 98.3 F (36.8 C) 99.2 F (37.3 C) 97.6 F (36.4 C) 97.9 F (36.6 C)  TempSrc: Oral Oral Oral Oral  Resp: 18 18 18 21   Height:      Weight:      SpO2: 97% 100% 99% 100%   Body mass index is 18.13 kg/(m^2).  Physical Exam  Constitutional:  She is more alert and talkative. Her husband and daughter are at the bedside.  HENT:  The erythema over her left parotid is markedly improved. The swelling has decreased since yesterday.  Eyes: Conjunctivae are normal.  Cardiovascular: Normal rate and regular rhythm.   No murmur heard. Pulmonary/Chest: Effort normal and breath sounds normal.  Skin: Rash noted.  Her vasculitic rash is fading on her hands. It is unchanged on her lower extremities.  Psychiatric: Mood and affect normal.    Lab Results Lab Results  Component Value Date   WBC 8.1 09/16/2015   HGB 8.5* 09/16/2015   HCT 26.2* 09/16/2015   MCV 84.5 09/16/2015   PLT 322 09/16/2015    Lab Results  Component Value Date   CREATININE 1.45* 09/16/2015   BUN  24* 09/16/2015   NA 141 09/16/2015   K 2.8* 09/16/2015   CL 108 09/16/2015   CO2 26 09/16/2015    Lab Results  Component Value Date   ALT 13* 09/15/2015   AST 21 09/15/2015   ALKPHOS 287* 09/15/2015   BILITOT 1.0 09/15/2015     Microbiology: Recent Results (from the past 240 hour(s))  Culture, blood (Routine X 2) w Reflex to ID Panel     Status: None (Preliminary result)   Collection Time: 10/01/2015  9:17 PM  Result Value Ref Range Status   Specimen Description BLOOD RIGHT ARM  Final   Special Requests IN PEDIATRIC BOTTLE 2CC  Final   Culture NO GROWTH 4 DAYS  Final   Report Status PENDING  Incomplete  Culture, blood (Routine X 2) w Reflex to ID Panel     Status: None   Collection Time: 09/28/2015  9:30 PM  Result Value Ref Range Status   Specimen Description BLOOD LEFT ARM  Final   Special Requests BOTTLES DRAWN AEROBIC AND ANAEROBIC 5CC  Final   Culture  Setup Time   Final    GRAM POSITIVE COCCI IN CLUSTERS AEROBIC BOTTLE ONLY CRITICAL RESULT CALLED TO, READ BACK BY AND VERIFIED WITHHenrietta Dine RN [id number] 09/13/15 A BROWNING    Culture METHICILLIN RESISTANT STAPHYLOCOCCUS AUREUS  Final   Report Status 09/15/2015 FINAL  Final   Organism ID, Bacteria METHICILLIN RESISTANT STAPHYLOCOCCUS AUREUS  Final      Susceptibility   Methicillin resistant staphylococcus aureus - MIC*    CIPROFLOXACIN <=0.5 SENSITIVE Sensitive     ERYTHROMYCIN >=8 RESISTANT Resistant     GENTAMICIN <=0.5 SENSITIVE Sensitive     OXACILLIN >=4 RESISTANT Resistant     TETRACYCLINE <=1 SENSITIVE Sensitive     VANCOMYCIN 1 SENSITIVE Sensitive     TRIMETH/SULFA <=10 SENSITIVE Sensitive     CLINDAMYCIN <=0.25 SENSITIVE Sensitive     RIFAMPIN <=0.5 SENSITIVE Sensitive     Inducible Clindamycin NEGATIVE Sensitive     * METHICILLIN RESISTANT STAPHYLOCOCCUS AUREUS  Urine culture     Status: None   Collection Time: 09/11/2015 10:22 PM  Result Value Ref Range Status   Specimen Description URINE, CATHETERIZED  Final    Special Requests NONE  Final   Culture 10,000 COLONIES/mL PSEUDOMONAS AERUGINOSA  Final   Report Status 09/15/2015 FINAL  Final   Organism ID, Bacteria PSEUDOMONAS AERUGINOSA  Final      Susceptibility  Pseudomonas aeruginosa - MIC*    CEFTAZIDIME 4 SENSITIVE Sensitive     CIPROFLOXACIN <=0.25 SENSITIVE Sensitive     GENTAMICIN 2 SENSITIVE Sensitive     IMIPENEM 2 SENSITIVE Sensitive     PIP/TAZO 8 SENSITIVE Sensitive     CEFEPIME 4 SENSITIVE Sensitive     * 10,000 COLONIES/mL PSEUDOMONAS AERUGINOSA  MRSA PCR Screening     Status: Abnormal   Collection Time: 09/13/15  2:21 PM  Result Value Ref Range Status   MRSA by PCR POSITIVE (A) NEGATIVE Final    Comment: RESULT CALLED TO, READ BACK BY AND VERIFIED WITH: Donnal Debar RN 17:25 09/13/15 ([email or web address])        The GeneXpert MRSA Assay (FDA approved for NASAL specimens only), is one component of a comprehensive MRSA colonization surveillance program. It is not intended to diagnose MRSA infection nor to guide or monitor treatment for MRSA infections.   Culture, blood (routine x 2)     Status: None (Preliminary result)   Collection Time: 09/14/15  2:36 PM  Result Value Ref Range Status   Specimen Description BLOOD LEFT HAND  Final   Special Requests BOTTLES DRAWN AEROBIC AND ANAEROBIC 10CC  Final   Culture NO GROWTH 2 DAYS  Final   Report Status PENDING  Incomplete  Culture, blood (routine x 2)     Status: None (Preliminary result)   Collection Time: 09/14/15  2:39 PM  Result Value Ref Range Status   Specimen Description BLOOD RIGHT HAND  Final   Special Requests BOTTLES DRAWN AEROBIC AND ANAEROBIC 10CC  Final   Culture NO GROWTH 2 DAYS  Final   Report Status PENDING  Incomplete     ASSESSMENT: She is improving on therapy for acute left parotitis and transient MRSA bacteremia. She has Pseudomonas growing in her urine culture but was not having any urinary symptoms prior to admission. I think MRSA is the primary pathogen here.  I will stop piperacillin tazobactam now. They discussed the pros and cons of transesophageal echocardiogram with Dr. Rowe Pavy this morning and will hold off on the procedure for now. That is certainly reasonable. I will continue vancomycin.  PLAN: 1. Continue vancomycin 2. Discontinue piperacillin tazobactam  Michel Bickers, MD [facility] [place] [phone] pager   [phone] cell 09/16/2015, 3:00 PM

## 2015-09-16 NOTE — Consult Note (Signed)
Consultation Note Date: 09/16/2015   Patient Name: Autumn Turner  DOB: 1961/03/14  MRN: [id number]  Age / Sex: 55 y.o., female  PCP: Robyn Haber, MD Referring Physician: Geradine Girt, DO  Reason for Consultation: Establishing goals of care    Clinical Assessment/Narrative:  Patient is a 55 year old lady with a past medical history significant for gastric bypass surgery 15 years ago. History is obtained through chart and through speaking with the patient's husband Autumn Turner over the phone. Patient, about 5 years ago had to undergo revision of her surgical procedure, she had several ulcers and scar tissue. Patient has been seeing Dr. Watt Climes with [facility] gastroenterology. She has had balloon stretches. In November 2016 she had stent placement which was removed. In December 2016 the patient was hospitalized with cavitary pneumonia and MRSA infection. Patient's husband states that she has an essentially lost the will to live and has had gradual progressive decline for about 2 years now. She has minimal to nil oral intake for a long time. She has had issues with vomiting and lack of nutrition chronically.  Patient has been admitted to the hospital this time with acute left parotitis. ENT is following, infectious disease is following. She also has Staphylococcus aureus bacteremia acute kidney injury hypokalemia. She continues to be weak and deconditioned and not eating. It is reported that a J-tube has been offered in the past that the patient has refused. Palliative consultation requested  Met with patient in the room. She appears weak, older than stated age. She is awake but not entirely alert. Patient states that she had severe pain and discomfort in her left jaw and could not rest well overnight. Other than that, she is not able to carry on any extensive conversations regarding her overall health.  Call placed and discussed  extensively with husband Autumn Turner at [phone]. He states that the patient has had a diminishing and poor quality of life for about 2 years now. He states that the patient herself has told him several times that she does not have the strength to hang on and does not have energy. She has escalating pain needs. Hence, the patient's husband was inquiring about hospice and palliative services.  Discussed with the patient's husband about current scope of hospitalization. Infectious disease is following. She is on antibiotics. Discussed about surface echocardiogram results and the recommendation for transesophageal echogram and the challenges and limitations because of acute left parotitis and the patient's GI history. The patient's husband agrees that the risks may outweigh any benefits for the TEE procedure at this point. Discussed about CODE STATUS. CODE STATUS is now established as DO NOT RESUSCITATE/DO NOT INTUBATE. We will provide pain management with morphine IV when necessary. Discussed about issues pertaining to artificial nutrition and hydration particularly in patients who are gradually progressively declining. Patient's husband agrees that comfort feeds and allowing the patient to eat as she tolerates is the most appropriate course. He states that he would prefer to keep her as comfortable as possible. This is his second marriage for both. They've been married for 10 years. The patient has 2 adult children who live locally and are well aware of the patient's condition.  Contacts/Participants in Discussion: Primary Decision Maker:    husband Relationship to Patient  Autumn Turner husband HCPOA: yes   husband  SUMMARY OF RECOMMENDATIONS: CODE STATUS now established as DO NOT RESUSCITATE/DO NOT INTUBATE Pain management with morphine IV Comfort feedings no J-tube Continue with antibiotics for now, watch patient's  hospital disease trajectory.  Likely discharge home with hospice towards the end  of this hospitalization. Palliative will follow along and help guide appropriate decision-making.  Code Status/Advance Care Planning: DNR    Code Status Orders        Start     Ordered   09/16/15 1121  Do not attempt resuscitation (DNR)   Continuous    Question Answer Comment  In the event of cardiac or respiratory ARREST Do not call a "code blue"   In the event of cardiac or respiratory ARREST Do not perform Intubation, CPR, defibrillation or ACLS   In the event of cardiac or respiratory ARREST Use medication by any route, position, wound care, and other measures to relive pain and suffering. May use oxygen, suction and manual treatment of airway obstruction as needed for comfort.      09/16/15 1120    Code Status History    Date Active Date Inactive Code Status Order ID Comments User Context   09/13/2015  2:35 AM 09/16/2015 11:20 AM Full Code [id number]  Eduard Clos, MD ED   08/13/2015  8:50 PM 08/20/2015  9:56 PM Full Code [id number]  Bobette Mo, MD ED   12/07/2012  4:35 PM 12/08/2012  4:33 PM Full Code [id number]  Antionette Char, MD Inpatient      Other Directives:Other  Symptom Management:    Add Morphine IV PRN  Palliative Prophylaxis:   Delirium Protocol  Additional Recommendations (Limitations, Scope, Preferences):   transitioning towards comfort as singular goal     Psycho-social/Spiritual:  Support System: Fair Desire for further Chaplaincy support:no Additional Recommendations: Caregiving  Support/Resources  Prognosis: < 4 weeks probably.   Discharge Planning: Home with Hospice recommended, discussed with husband.    Chief Complaint/ Primary Diagnoses: Present on Admission:  . AKI (acute kidney injury) ([facility]) . Leukocytoclastic vasculitis ([facility]) . Acute parotitis . ARF (acute renal failure) ([facility]) . Pressure ulcer . Protein-calorie malnutrition, severe . Staphylococcus aureus bacteremia  I have reviewed the medical record, interviewed  the patient and family, and examined the patient. The following aspects are pertinent.  Past Medical History  Diagnosis Date  . Abnormal Pap smear   . Vulvar lesion   . CIN II (cervical intraepithelial neoplasia II)   . Malnourished ([facility])   . Sleep apnea     Loss weight with gastric bypass  . Cancer ([facility])     vulvar  . Anemia     blood transfusion  . Hypertension     no longer on medication  . Anxiety   . Depression   . History of stomach ulcers    Social History   Social History  . Marital Status: Married    Spouse Name: N/A  . Number of Children: N/A  . Years of Education: N/A   Social History Main Topics  . Smoking status: Former Smoker -- 1.00 packs/day for 30 years    Types: Cigarettes    Quit date: 07/26/2015  . Smokeless tobacco: Never Used  . Alcohol Use: No  . Drug Use: No  . Sexual Activity: No   Other Topics Concern  . None   Social History Narrative   Family History  Problem Relation Age of Onset  . Cancer Father   . Hypertension Father   . Lung cancer Father    Scheduled Meds: . antiseptic oral rinse  7 mL Mouth Rinse BID  . buPROPion  300 mg Oral Daily  . Chlorhexidine Gluconate Cloth  6 each  Topical Q0600  . cholecalciferol  1,000 Units Oral Daily  . feeding supplement  1 Container Oral BID BM  . feeding supplement (ENSURE ENLIVE)  237 mL Oral BID BM  . multivitamin with minerals  1 tablet Oral Daily  . mupirocin ointment  1 application Nasal BID  . pantoprazole  40 mg Oral Daily  . piperacillin-tazobactam (ZOSYN)  IV  3.375 g Intravenous Q8H  . saccharomyces boulardii  250 mg Oral BID  . vancomycin  500 mg Intravenous Q24H  . vitamin B-12  1,000 mcg Oral Daily   Continuous Infusions: . dextrose 5 % and 0.9 % NaCl with KCl 20 mEq/L 75 mL/hr at 09/15/15 1437   PRN Meds:.acetaminophen **OR** acetaminophen, benzocaine, diphenhydrAMINE, loratadine, morphine injection, ondansetron **OR** ondansetron (ZOFRAN) IV, oxyCODONE,  traMADol Medications Prior to Admission:  Prior to Admission medications   Medication Sig Start Date End Date Taking? Authorizing Provider  buPROPion (WELLBUTRIN XL) 150 MG 24 hr tablet Take 300 mg by mouth daily.   Yes Historical Provider, MD  cholecalciferol (VITAMIN D) 1000 UNITS tablet Take 1,000 Units by mouth daily. Reported on 10/02/2015   Yes Historical Provider, MD  clidinium-chlordiazePOXIDE (LIBRAX) 5-2.5 MG capsule Take 1 capsule by mouth 4 (four) times daily -  before meals and at bedtime. 08/01/15  Yes Historical Provider, MD  clindamycin (CLEOCIN) 300 MG capsule Take 300 mg by mouth 3 (three) times daily.   Yes Historical Provider, MD  diphenhydrAMINE (BENADRYL) 12.5 MG chewable tablet Chew 12.5 mg by mouth 4 (four) times daily as needed for allergies.   Yes Historical Provider, MD  esomeprazole (NEXIUM) 20 MG capsule Take 20 mg by mouth daily.   Yes Historical Provider, MD  Multiple Vitamins-Minerals (MULTIVITAMIN PO) Take 1 tablet by mouth daily.    Yes Historical Provider, MD  saccharomyces boulardii (FLORASTOR) 250 MG capsule Take 250 mg by mouth 2 (two) times daily.   Yes Historical Provider, MD  traMADol (ULTRAM) 50 MG tablet TAKE 1 TABLET BY MOUTH THREE TIMES DAILY AS NEEDED 01/25/15  Yes Robyn Haber, MD  vitamin B-12 (CYANOCOBALAMIN) 1000 MCG tablet Take 1,000 mcg by mouth daily.   Yes Historical Provider, MD  loratadine (CLARITIN) 10 MG tablet Take 10 mg by mouth daily as needed for allergies or rhinitis.     Historical Provider, MD  oxycodone (OXY-IR) 5 MG capsule Take 5 mg by mouth every 6 (six) hours as needed.    Historical Provider, MD  protein supplement (UNJURY CHICKEN SOUP) POWD Take 7 g (2 oz total) by mouth daily. Patient not taking: Reported on 09/11/2015 08/20/15   Charlynne Cousins, MD  Tetrahydrozoline HCl (VISINE OP) Place 1-2 drops into both eyes as needed (for dry eyes). Reported on 09/09/2015    Historical Provider, MD   Allergies  Allergen Reactions   . Carafate [Sucralfate] Other (See Comments)    unspecified  . Ciprocinonide [Fluocinolone] Hives and Rash  . Ciprofloxacin Other (See Comments)    Blisters, boils  . Dust Mite Extract Itching  . Iohexol Hives       . Nsaids Other (See Comments)    Due to Hx of gastric bypass    Review of Systems Reportedly through husband, declining functional status, declining strength, weak, lack of appetite Physical Exam Weak frail lady looks older than stated age Shallow breathing coarse rhonchi anteriorly S1-S2 Abdomen soft Extremities trace edema Patient opens eyes answers a few questions otherwise is not able to provide detailed history appears weak and chronically ill-appearing,  deconditioned severely, pale Vital Signs: BP 149/82 mmHg  Pulse 76  Temp(Src) 97.6 F (36.4 C) (Oral)  Resp 18  Ht '5\' 4"'$  (1.626 m)  Wt 47.945 kg (105 lb 11.2 oz)  BMI 18.13 kg/m2  SpO2 99%  SpO2: SpO2: 99 % O2 Device:SpO2: 99 % O2 Flow Rate: .O2 Flow Rate (L/min): 2 L/min  IO: Intake/output summary:  Intake/Output Summary (Last 24 hours) at 09/16/15 1122 Last data filed at 09/15/15 1900  Gross per 24 hour  Intake   1000 ml  Output    250 ml  Net    750 ml    LBM: Last BM Date: 09/14/15 Baseline Weight: Weight: 47.945 kg (105 lb 11.2 oz) Most recent weight: Weight: 47.945 kg (105 lb 11.2 oz)      Palliative Assessment/Data:  Flowsheet Rows        Most Recent Value   Intake Tab    Referral Department  Hospitalist   Unit at Time of Referral  Med/Surg Unit   Palliative Care Primary Diagnosis  Sepsis/Infectious Disease   Palliative Care Type  New Palliative care   Reason for referral  Non-pain Symptom, Pain, Clarify Goals of Care, End of Life Care Assistance   Date first seen by Palliative Care  09/16/15   Clinical Assessment    Palliative Performance Scale Score  30%   Pain Max last 24 hours  8   Pain Min Last 24 hours  7   Dyspnea Max Last 24 Hours  6   Dyspnea Min Last 24 hours  5    Nausea Max Last 24 Hours  4   Nausea Min Last 24 Hours  3   Psychosocial & Spiritual Assessment    Palliative Care Outcomes    Patient/Family meeting held?  Yes   Who was at the meeting?  husband on phone   Palliative Care Outcomes  Improved pain interventions, Clarified goals of care, Counseled regarding hospice   Palliative Care follow-up planned  Yes, Facility      Additional Data Reviewed:  CBC:    Component Value Date/Time   WBC 8.1 09/16/2015 0601   WBC 4.7 01/07/2015 1417   HGB 8.5* 09/16/2015 0601   HGB 9.0* 01/07/2015 1417   HCT 26.2* 09/16/2015 0601   HCT 29.6* 01/07/2015 1417   PLT 322 09/16/2015 0601   MCV 84.5 09/16/2015 0601   MCV 82.4 01/07/2015 1417   NEUTROABS 15.8* 09/13/2015 0308   LYMPHSABS 0.9 09/13/2015 0308   MONOABS 0.6 09/13/2015 0308   EOSABS 0.0 09/13/2015 0308   BASOSABS 0.0 09/13/2015 0308   Comprehensive Metabolic Panel:    Component Value Date/Time   NA 141 09/16/2015 0601   K 2.8* 09/16/2015 0601   CL 108 09/16/2015 0601   CO2 26 09/16/2015 0601   BUN 24* 09/16/2015 0601   CREATININE 1.45* 09/16/2015 0601   CREATININE 0.55 01/07/2015 1406   GLUCOSE 77 09/16/2015 0601   CALCIUM 8.1* 09/16/2015 0601   AST 21 09/15/2015 0605   ALT 13* 09/15/2015 0605   ALKPHOS 287* 09/15/2015 0605   BILITOT 1.0 09/15/2015 0605   PROT 4.6* 09/15/2015 0605   ALBUMIN <1.0* 09/15/2015 0605     Time In: 10 Time Out: 11 Time Total: 60 Greater than 50%  of this time was spent counseling and coordinating care related to the above assessment and plan.  Signed by: Loistine Chance, MD [id number] Loistine Chance, MD  09/16/2015, 11:22 AM  Please contact Palliative Medicine Team phone at [phone] for  questions and concerns.

## 2015-09-16 NOTE — Progress Notes (Signed)
PROGRESS NOTE  Autumn Turner [id number] DOB: 1961/07/19 DOA: 09/19/2015 PCP: Robyn Haber, MD  Assessment/Plan: Staph aureus bacteremia -IV vanc -ID following -echo-- not sure if TEE can be done with facial swelling plus patient had gastric bypass and a revision-- will speak with cards on Monday -blood cultures repeated  Acute renal failure probably from poor oral intake and dehydration -  d5IVF   Skin rash: ? Related to MRSA bacteremia -improving on hands faster than legs  Left Parotid swellinig - ENT following IVF IV abx -? CT scan of neck-- per Dr. Benjamine Mola will not change management  Leukocytosis -normalized  Hypokalemia -replete and recheck -monitor Mg and phos for re-feeding syndrome  Anemia - trend Transfuse for <7  Recent admission for cavitary pneumonia with MRSA  Protein calorie malnutrition with history of gastric bypass - nutrition consult. -patient is going to try to eat better -does NOT want NG tube -GI doc has mentioned J tube in past  Elevated alkaline phosphatase levels - cause not known. Closely observe.  pseudomonas in urine culture -<10,000 colonies -defer to ID  appreciate palliative care  Code Status: DNR Family Communication: patient/husband at beside 1/14 Disposition Plan:    Consultants:  ID  Palliative care  PT  Procedures:      HPI/Subjective: Did not sleep well but feels she is eating better  Objective: Filed Vitals:   09/16/15 0602 09/16/15 1310  BP: 149/82 148/83  Pulse: 76 83  Temp: 97.6 F (36.4 C) 97.9 F (36.6 C)  Resp: 18 21    Intake/Output Summary (Last 24 hours) at 09/16/15 1341 Last data filed at 09/15/15 1900  Gross per 24 hour  Intake   1000 ml  Output      0 ml  Net   1000 ml   Filed Weights   09/13/15 1406  Weight: 47.945 kg (105 lb 11.2 oz)    Exam:   General:  Awake, ill appearing  Cardiovascular: rrr  Respiratory: clear  Abdomen: thin, + BS  Musculoskeletal: +  edema  Data Reviewed: Basic Metabolic Panel:  Recent Labs Lab 09/16/2015 1625 09/13/15 0308 09/14/15 0658 09/15/15 0605 09/16/15 0601  NA 132* 133* 135 135 141  K 3.9 3.4* 3.2* 3.5 2.8*  CL 93* 99* 102 100* 108  CO2 27 25 24 24 26   GLUCOSE 65 63* 38* 54* 77  BUN 42* 40* 38* 32* 24*  CREATININE 1.59* 1.62* 1.59* 1.58* 1.45*  CALCIUM 9.1 7.9* 8.1* 8.2* 8.1*  MG  --   --   --  1.4* 1.9  PHOS  --   --   --  4.1 3.8   Liver Function Tests:  Recent Labs Lab 09/27/2015 1625 09/13/15 0308 09/14/15 0658 09/15/15 0605  AST 19 15 15 21   ALT 16 12* 14 13*  ALKPHOS 368* 291* 283* 287*  BILITOT 0.5 0.5 0.6 1.0  PROT 5.6* 4.4* 4.9* 4.6*  ALBUMIN 1.2* <1.0* <1.0* <1.0*    Recent Labs Lab 09/25/2015 1625  LIPASE 14   No results for input(s): AMMONIA in the last 168 hours. CBC:  Recent Labs Lab 10/01/2015 1625 09/13/15 0308 09/14/15 0658 09/15/15 0605 09/16/15 0601  WBC 17.7* 17.3* 25.2* 15.0* 8.1  NEUTROABS  --  15.8*  --   --   --   HGB 7.5* 6.1* 9.3* 8.6* 8.5*  HCT 25.3* 20.2* 27.4* 26.9* 26.2*  MCV 83.2 82.8 81.5 84.3 84.5  PLT 459* 437* 413* 375 322   Cardiac Enzymes:  Recent Labs Lab  09/21/2015 2242  CKTOTAL 15*   BNP (last 3 results) No results for input(s): BNP in the last 8760 hours.  ProBNP (last 3 results) No results for input(s): PROBNP in the last 8760 hours.  CBG:  Recent Labs Lab 09/14/15 0957 09/15/15 0847  GLUCAP 78 83    Recent Results (from the past 240 hour(s))  Culture, blood (Routine X 2) w Reflex to ID Panel     Status: None (Preliminary result)   Collection Time: 10/01/2015  9:17 PM  Result Value Ref Range Status   Specimen Description BLOOD RIGHT ARM  Final   Special Requests IN PEDIATRIC BOTTLE 2CC  Final   Culture NO GROWTH 3 DAYS  Final   Report Status PENDING  Incomplete  Culture, blood (Routine X 2) w Reflex to ID Panel     Status: None   Collection Time: 09/06/2015  9:30 PM  Result Value Ref Range Status   Specimen  Description BLOOD LEFT ARM  Final   Special Requests BOTTLES DRAWN AEROBIC AND ANAEROBIC 5CC  Final   Culture  Setup Time   Final    GRAM POSITIVE COCCI IN CLUSTERS AEROBIC BOTTLE ONLY CRITICAL RESULT CALLED TO, READ BACK BY AND VERIFIED WITHHenrietta Dine RN 2246 09/13/15 A BROWNING    Culture METHICILLIN RESISTANT STAPHYLOCOCCUS AUREUS  Final   Report Status 09/15/2015 FINAL  Final   Organism ID, Bacteria METHICILLIN RESISTANT STAPHYLOCOCCUS AUREUS  Final      Susceptibility   Methicillin resistant staphylococcus aureus - MIC*    CIPROFLOXACIN <=0.5 SENSITIVE Sensitive     ERYTHROMYCIN >=8 RESISTANT Resistant     GENTAMICIN <=0.5 SENSITIVE Sensitive     OXACILLIN >=4 RESISTANT Resistant     TETRACYCLINE <=1 SENSITIVE Sensitive     VANCOMYCIN 1 SENSITIVE Sensitive     TRIMETH/SULFA <=10 SENSITIVE Sensitive     CLINDAMYCIN <=0.25 SENSITIVE Sensitive     RIFAMPIN <=0.5 SENSITIVE Sensitive     Inducible Clindamycin NEGATIVE Sensitive     * METHICILLIN RESISTANT STAPHYLOCOCCUS AUREUS  Urine culture     Status: None   Collection Time: 09/29/2015 10:22 PM  Result Value Ref Range Status   Specimen Description URINE, CATHETERIZED  Final   Special Requests NONE  Final   Culture 10,000 COLONIES/mL PSEUDOMONAS AERUGINOSA  Final   Report Status 09/15/2015 FINAL  Final   Organism ID, Bacteria PSEUDOMONAS AERUGINOSA  Final      Susceptibility   Pseudomonas aeruginosa - MIC*    CEFTAZIDIME 4 SENSITIVE Sensitive     CIPROFLOXACIN <=0.25 SENSITIVE Sensitive     GENTAMICIN 2 SENSITIVE Sensitive     IMIPENEM 2 SENSITIVE Sensitive     PIP/TAZO 8 SENSITIVE Sensitive     CEFEPIME 4 SENSITIVE Sensitive     * 10,000 COLONIES/mL PSEUDOMONAS AERUGINOSA  MRSA PCR Screening     Status: Abnormal   Collection Time: 09/13/15  2:21 PM  Result Value Ref Range Status   MRSA by PCR POSITIVE (A) NEGATIVE Final    Comment: RESULT CALLED TO, READ BACK BY AND VERIFIED WITH: Donnal Debar RN 17:25 09/13/15 ([email or web address])         The GeneXpert MRSA Assay (FDA approved for NASAL specimens only), is one component of a comprehensive MRSA colonization surveillance program. It is not intended to diagnose MRSA infection nor to guide or monitor treatment for MRSA infections.   Culture, blood (routine x 2)     Status: None (Preliminary result)   Collection Time: 09/14/15  2:36 PM  Result Value Ref Range Status   Specimen Description BLOOD LEFT HAND  Final   Special Requests BOTTLES DRAWN AEROBIC AND ANAEROBIC 10CC  Final   Culture NO GROWTH < 24 HOURS  Final   Report Status PENDING  Incomplete  Culture, blood (routine x 2)     Status: None (Preliminary result)   Collection Time: 09/14/15  2:39 PM  Result Value Ref Range Status   Specimen Description BLOOD RIGHT HAND  Final   Special Requests BOTTLES DRAWN AEROBIC AND ANAEROBIC 10CC  Final   Culture NO GROWTH < 24 HOURS  Final   Report Status PENDING  Incomplete     Studies: No results found.  Scheduled Meds: . antiseptic oral rinse  7 mL Mouth Rinse BID  . buPROPion  300 mg Oral Daily  . Chlorhexidine Gluconate Cloth  6 each Topical Q0600  . cholecalciferol  1,000 Units Oral Daily  . feeding supplement  1 Container Oral BID BM  . feeding supplement (ENSURE ENLIVE)  237 mL Oral BID BM  . multivitamin with minerals  1 tablet Oral Daily  . mupirocin ointment  1 application Nasal BID  . pantoprazole  40 mg Oral Daily  . piperacillin-tazobactam (ZOSYN)  IV  3.375 g Intravenous Q8H  . potassium chloride  10 mEq Intravenous Q1 Hr x 3  . saccharomyces boulardii  250 mg Oral BID  . vancomycin  500 mg Intravenous Q24H  . vitamin B-12  1,000 mcg Oral Daily   Continuous Infusions: . dextrose 5 % and 0.9 % NaCl with KCl 20 mEq/L 75 mL/hr at 09/15/15 1437   Antibiotics Given (last 72 hours)    Date/Time Action Medication Dose Rate   09/13/15 1545 Given  [barcode faded. verified medication]   acyclovir (ZOVIRAX) 250 mg in dextrose 5 % 100 mL IVPB 250 mg 105  mL/hr   09/13/15 2126 Given   vancomycin (VANCOCIN) 500 mg in sodium chloride 0.9 % 100 mL IVPB 500 mg 100 mL/hr   09/14/15 0057 Given  [IV pump was giving problems]   acyclovir (ZOVIRAX) 250 mg in dextrose 5 % 100 mL IVPB 250 mg 105 mL/hr   09/14/15 0226 Given   piperacillin-tazobactam (ZOSYN) IVPB 3.375 g 3.375 g 12.5 mL/hr   09/14/15 1343 Given   piperacillin-tazobactam (ZOSYN) IVPB 3.375 g 3.375 g 12.5 mL/hr   09/14/15 1739 Given   piperacillin-tazobactam (ZOSYN) IVPB 3.375 g 3.375 g 12.5 mL/hr   09/15/15 0101 Given   piperacillin-tazobactam (ZOSYN) IVPB 3.375 g 3.375 g 12.5 mL/hr   09/15/15 [id number] Given   vancomycin (VANCOCIN) 500 mg in sodium chloride 0.9 % 100 mL IVPB 500 mg 100 mL/hr   09/15/15 1400 Given   piperacillin-tazobactam (ZOSYN) IVPB 3.375 g 3.375 g 12.5 mL/hr   09/15/15 2121 Given   vancomycin (VANCOCIN) 500 mg in sodium chloride 0.9 % 100 mL IVPB 500 mg 100 mL/hr   09/16/15 0017 Given   piperacillin-tazobactam (ZOSYN) IVPB 3.375 g 3.375 g 12.5 mL/hr      Principal Problem:   AKI (acute kidney injury) ([place]) Active Problems:   Protein-calorie malnutrition, severe   Pressure ulcer   Leukocytoclastic vasculitis ([place])   Acute parotitis   ARF (acute renal failure) ([place])   Staphylococcus aureus bacteremia   Dehydration   Encounter for palliative care   Jaw pain   Goals of care, counseling/discussion    Time spent: 35 min    [facility] Pager [phone]. If 7PM-7AM, please contact night-coverage at  [email or web address], password [facility] 09/16/2015, 1:41 PM  LOS: 4 days

## 2015-09-16 NOTE — Evaluation (Signed)
Physical Therapy Evaluation Patient Details Name: Autumn Turner MRN: [id number] DOB: 02/12/1961 Today's Date: 09/16/2015   History of Present Illness  Pt is a 55 y/o female with a PMH significant for gastric bypass surgery 15 years ago with revision 5 years ago due to ulcers and scar tissue. 08/2015 pt was hospitalized with PNA and MRSA. Lack of nurtrition and vomiting are chronic issues for this patient. Pt presents this admission with acute L parotitis.  Clinical Impression  Pt admitted with above diagnosis. Pt currently with functional limitations due to the deficits listed below (see PT Problem List). At the time of PT eval, mobility was limited by active vomiting when PT entered. Therapist assisted with cleaning up pt, changing gown, and repositioning in the bed.   Discussed plans for d/c and pt/family wishes for involvement of physical therapy during hospital stay. The patient's husband reports that they are up in the air between wanting pt to try rehab at a SNF again vs. pt returning home with hospice services. Pt agrees with this statement. Therapist encouraged pt to continue to work with PT this admission and we would make appropriate recommendations (SNF vs. Hospice) and help guide the pt/family also as they make decisions about d/c and what equipment they may need if they decide to return home.   Pt and family appeared at ease and appreciative of therapy's input at end of session. Will continue to follow.     Follow Up Recommendations SNF;Supervision/Assistance - 24 hour    Equipment Recommendations  Other (comment) (TBD as pt progresses with mobility)    Recommendations for Other Services       Precautions / Restrictions Precautions Precautions: Fall Precaution Comments: Pt very frail and weak Restrictions Weight Bearing Restrictions: No      Mobility  Bed Mobility Overal bed mobility: Needs Assistance Bed Mobility: Rolling Rolling: Min assist         General bed  mobility comments: Pt required heavy use of bed rails for support as she rolled to the side. Bed pad used to complete roll to tuck pillow under R hip to offload an existing pressure injury. +2 was utilized to scoot pt up in the bed with use of bed pad. Pt was able to attempt scooting and attempt repositioning in bed but requires assistance. Family quick to offer assistance and try to move pt instead of letting pt try to complete repositioning tasks independently.   Transfers                 General transfer comment: Unable to attempt this session due to pt vomiting when PT entered.   Ambulation/Gait                Stairs            Wheelchair Mobility    Modified Rankin (Stroke Patients Only)       Balance                                             Pertinent Vitals/Pain      Home Living Family/patient expects to be discharged to:: Skilled nursing facility Living Arrangements: Spouse/significant other                    Prior Function Level of Independence: Needs assistance      ADL's / Homemaking Assistance Needed: Per  chart review pt was independent prior to last admission in december, however it appears that pt has had a progressive decline in function and it is likely that pt's husband has been providing some level of assistance at home for IADL's.        Hand Dominance        Extremity/Trunk Assessment   Upper Extremity Assessment: Defer to OT evaluation           Lower Extremity Assessment: Generalized weakness         Communication   Communication: No difficulties  Cognition Arousal/Alertness: Awake/alert Behavior During Therapy: Restless Overall Cognitive Status: Within Functional Limits for tasks assessed                      General Comments      Exercises        Assessment/Plan    PT Assessment Patient needs continued PT services  PT Diagnosis Difficulty walking;Generalized  weakness   PT Problem List Decreased strength;Decreased range of motion;Decreased activity tolerance;Decreased balance;Decreased mobility;Decreased safety awareness;Decreased knowledge of use of DME;Decreased knowledge of precautions;Cardiopulmonary status limiting activity;Pain  PT Treatment Interventions DME instruction;Gait training;Stair training;Functional mobility training;Therapeutic activities;Therapeutic exercise;Neuromuscular re-education;Patient/family education   PT Goals (Current goals can be found in the Care Plan section) Acute Rehab PT Goals Patient Stated Goal: Feel better PT Goal Formulation: With patient/family Time For Goal Achievement: 09/30/15 Potential to Achieve Goals: Fair    Frequency Min 3X/week   Barriers to discharge        Co-evaluation               End of Session Equipment Utilized During Treatment: Oxygen Activity Tolerance: Treatment limited secondary to medical complications (Comment) (Pt vomiting when PT entered room ) Patient left: in bed;with call bell/phone within reach;with family/visitor present Nurse Communication: Mobility status         Time: 1317-1330 PT Time Calculation (min) (ACUTE ONLY): 13 min   Charges:   PT Evaluation $PT Eval Moderate Complexity: 1 Procedure     PT G Codes:        Rolinda Roan 2015-09-27, 1:50 PM   Rolinda Roan, PT, DPT Acute Rehabilitation Services Pager: [phone]

## 2015-09-16 NOTE — Progress Notes (Signed)
PT Cancellation Note  Patient Details Name: Autumn Turner MRN: [id number] DOB: 11-14-1960   Cancelled Treatment:    Reason Eval/Treat Not Completed: Patient not medically ready. Noted that pt's Potassium level is currently at 2.8 mmol/L. It does not appear that supplementation has began as of this time. Will check back as schedule allows to complete PT eval.    Rolinda Roan 09/16/2015, 7:39 AM   Rolinda Roan, PT, DPT Acute Rehabilitation Services Pager: [phone]

## 2015-09-16 NOTE — Progress Notes (Signed)
Subjective: Facial pain slightly improved. Still swollen.  Objective: Vital signs in last 24 hours: Temp:  [97.6 F (36.4 C)-99.2 F (37.3 C)] 97.6 F (36.4 C) (01/15 0602) Pulse Rate:  [76-96] 76 (01/15 0602) Resp:  [18] 18 (01/15 0602) BP: (142-158)/(80-100) 149/82 mmHg (01/15 0602) SpO2:  [97 %-100 %] 99 % (01/15 0602)  Physical Exam  Constitutional: She appears cachectic.  Head: Atraumatic.  Ears: Normal auricles and EACs. Nose/Mouth/Throat: Mucous membranes are dry.  Swelling, tenderness of the left parotid gland is noted. Eyes: Conjunctivae are normal. Right eye exhibits no discharge. Left eye exhibits no discharge.  Neck: Normal range of motion. Neck supple.  Cardiovascular: Normal rate, regular rhythm and normal heart sounds.  Pulmonary/Chest: Effort normal and breath sounds normal. No respiratory distress. She has no wheezes.  Skin: Skin is warm and dry.  Psychiatric: She has a normal mood and affect.    Recent Labs  09/15/15 0605 09/16/15 0601  WBC 15.0* 8.1  HGB 8.6* 8.5*  HCT 26.9* 26.2*  PLT 375 322    Recent Labs  09/15/15 0605 09/16/15 0601  NA 135 141  K 3.5 2.8*  CL 100* 108  CO2 24 26  GLUCOSE 54* 77  BUN 32* 24*  CREATININE 1.58* 1.45*  CALCIUM 8.2* 8.1*    Medications:  I have reviewed the patient's current medications. Scheduled: . antiseptic oral rinse  7 mL Mouth Rinse BID  . buPROPion  300 mg Oral Daily  . Chlorhexidine Gluconate Cloth  6 each Topical Q0600  . cholecalciferol  1,000 Units Oral Daily  . feeding supplement  1 Container Oral BID BM  . feeding supplement (ENSURE ENLIVE)  237 mL Oral BID BM  . multivitamin with minerals  1 tablet Oral Daily  . mupirocin ointment  1 application Nasal BID  . pantoprazole  40 mg Oral Daily  . piperacillin-tazobactam (ZOSYN)  IV  3.375 g Intravenous Q8H  . saccharomyces boulardii  250 mg Oral BID  . vancomycin  500 mg Intravenous Q24H  . vitamin B-12  1,000 mcg Oral Daily    [id number] **OR** acetaminophen, benzocaine, diphenhydrAMINE, loratadine, morphine injection, ondansetron **OR** ondansetron (ZOFRAN) IV, oxyCODONE, traMADol  Assessment/Plan: Acute left parotitis, secondary to severe dehydration. WBC has normalized. Continue IV fluid and IV abx. Ok to have a neck CT scan. Likely will not change the management of the parotitis.    LOS: 4 days   Autumn Turner,SUI W 09/16/2015, 11:53 AM

## 2015-09-16 NOTE — Progress Notes (Addendum)
ANTIBIOTIC CONSULT NOTE -  Follow up  Pharmacy Consult for Vancomycin and Zosyn  Indication:  Sepsis/ Staph aureus bacteremia  Allergies  Allergen Reactions  . Carafate [Sucralfate] Other (See Comments)    unspecified  . Ciprocinonide [Fluocinolone] Hives and Rash  . Ciprofloxacin Other (See Comments)    Blisters, boils  . Dust Mite Extract Itching  . Iohexol Hives       . Nsaids Other (See Comments)    Due to Hx of gastric bypass    Patient Measurements: Height: 5\' 4"  (162.6 cm) Weight: 105 lb 11.2 oz (47.945 kg) IBW/kg (Calculated) : 54.7  Vital Signs: Temp: 97.9 F (36.6 C) (01/15 1310) Temp Source: Oral (01/15 1310) BP: 148/83 mmHg (01/15 1310) Pulse Rate: 83 (01/15 1310) Intake/Output from previous day: 01/14 0701 - 01/15 0700 In: 1000 [I.V.:900; IV Piggyback:100] Out: 250 [Urine:250] Intake/Output from this shift:    Labs:  Recent Labs  09/14/15 0658 09/15/15 0605 09/16/15 0601  WBC 25.2* 15.0* 8.1  HGB 9.3* 8.6* 8.5*  PLT 413* 375 322  CREATININE 1.59* 1.58* 1.45*   Estimated Creatinine Clearance: 32.8 mL/min (by C-G formula based on Cr of 1.45). No results for input(s): VANCOTROUGH, VANCOPEAK, VANCORANDOM, GENTTROUGH, GENTPEAK, GENTRANDOM, TOBRATROUGH, TOBRAPEAK, TOBRARND, AMIKACINPEAK, AMIKACINTROU, AMIKACIN in the last 72 hours.   Microbiology: Recent Results (from the past 720 hour(s))  Culture, blood (Routine X 2) w Reflex to ID Panel     Status: None (Preliminary result)   Collection Time: 09/02/2015  9:17 PM  Result Value Ref Range Status   Specimen Description BLOOD RIGHT ARM  Final   Special Requests IN PEDIATRIC BOTTLE 2CC  Final   Culture NO GROWTH 3 DAYS  Final   Report Status PENDING  Incomplete  Culture, blood (Routine X 2) w Reflex to ID Panel     Status: None   Collection Time: 09/02/2015  9:30 PM  Result Value Ref Range Status   Specimen Description BLOOD LEFT ARM  Final   Special Requests BOTTLES DRAWN AEROBIC AND ANAEROBIC 5CC   Final   Culture  Setup Time   Final    GRAM POSITIVE COCCI IN CLUSTERS AEROBIC BOTTLE ONLY CRITICAL RESULT CALLED TO, READ BACK BY AND VERIFIED WITHHenrietta Dine RN 2246 09/13/15 A BROWNING    Culture METHICILLIN RESISTANT STAPHYLOCOCCUS AUREUS  Final   Report Status 09/15/2015 FINAL  Final   Organism ID, Bacteria METHICILLIN RESISTANT STAPHYLOCOCCUS AUREUS  Final      Susceptibility   Methicillin resistant staphylococcus aureus - MIC*    CIPROFLOXACIN <=0.5 SENSITIVE Sensitive     ERYTHROMYCIN >=8 RESISTANT Resistant     GENTAMICIN <=0.5 SENSITIVE Sensitive     OXACILLIN >=4 RESISTANT Resistant     TETRACYCLINE <=1 SENSITIVE Sensitive     VANCOMYCIN 1 SENSITIVE Sensitive     TRIMETH/SULFA <=10 SENSITIVE Sensitive     CLINDAMYCIN <=0.25 SENSITIVE Sensitive     RIFAMPIN <=0.5 SENSITIVE Sensitive     Inducible Clindamycin NEGATIVE Sensitive     * METHICILLIN RESISTANT STAPHYLOCOCCUS AUREUS  Urine culture     Status: None   Collection Time: 09/25/2015 10:22 PM  Result Value Ref Range Status   Specimen Description URINE, CATHETERIZED  Final   Special Requests NONE  Final   Culture 10,000 COLONIES/mL PSEUDOMONAS AERUGINOSA  Final   Report Status 09/15/2015 FINAL  Final   Organism ID, Bacteria PSEUDOMONAS AERUGINOSA  Final      Susceptibility   Pseudomonas aeruginosa - MIC*    CEFTAZIDIME  4 SENSITIVE Sensitive     CIPROFLOXACIN <=0.25 SENSITIVE Sensitive     GENTAMICIN 2 SENSITIVE Sensitive     IMIPENEM 2 SENSITIVE Sensitive     PIP/TAZO 8 SENSITIVE Sensitive     CEFEPIME 4 SENSITIVE Sensitive     * 10,000 COLONIES/mL PSEUDOMONAS AERUGINOSA  MRSA PCR Screening     Status: Abnormal   Collection Time: 09/13/15  2:21 PM  Result Value Ref Range Status   MRSA by PCR POSITIVE (A) NEGATIVE Final    Comment: RESULT CALLED TO, READ BACK BY AND VERIFIED WITH: Donnal Debar RN 17:25 09/13/15 ([email or web address])        The GeneXpert MRSA Assay (FDA approved for NASAL specimens only), is one component of  a comprehensive MRSA colonization surveillance program. It is not intended to diagnose MRSA infection nor to guide or monitor treatment for MRSA infections.   Culture, blood (routine x 2)     Status: None (Preliminary result)   Collection Time: 09/14/15  2:36 PM  Result Value Ref Range Status   Specimen Description BLOOD LEFT HAND  Final   Special Requests BOTTLES DRAWN AEROBIC AND ANAEROBIC 10CC  Final   Culture NO GROWTH < 24 HOURS  Final   Report Status PENDING  Incomplete  Culture, blood (routine x 2)     Status: None (Preliminary result)   Collection Time: 09/14/15  2:39 PM  Result Value Ref Range Status   Specimen Description BLOOD RIGHT HAND  Final   Special Requests BOTTLES DRAWN AEROBIC AND ANAEROBIC 10CC  Final   Culture NO GROWTH < 24 HOURS  Final   Report Status PENDING  Incomplete    Medical History: Past Medical History  Diagnosis Date  . Abnormal Pap smear   . Vulvar lesion   . CIN II (cervical intraepithelial neoplasia II)   . Malnourished ([place])   . Sleep apnea     Loss weight with gastric bypass  . Cancer ([place])     vulvar  . Anemia     blood transfusion  . Hypertension     no longer on medication  . Anxiety   . Depression   . History of stomach ulcers     Medications:  Wellbutrin  Ultram  Librax  Cleocin  Nexium  Claritin  MVI  Assessment: 55 y.o. female with h/o cavitary PNA, on Clindamycin at home. Admitted for  anemia/weakness, possible sepsis, for empiric antibiotics .  Staph aureus bacteremia- continues on Vancomycin 500mg   IV q24h & zosyne 3.375 g IV q8h Leukocytosis improved, WBC 8.1K SCr 1.45, CrCl ~ 8ml/min acute renal failure possibly from poor oral intake and dehydration.IVFs continued.  Blood cultures repeated.  Left Parotid swellinig -ENT following. On IV abx.   1/11 Blood cx > 1/2 staph aureus MRSA 1/11 Urine cx >  pseudomonas aeruginosa (pan sens) 1/13 MRSA PCR positive 1/13 Blood cx x2 ngtd   Goal of Therapy:  Vancomycin  trough level 15-20 mcg/ml  Plan:  Vancomycin 500 mg IV q24h Zosyn 3.375 g IV q8h (4h infusion) Will check steady state vanc trough tonight  Nicole Cella, RPh Clinical Pharmacist Pager: [phone] 09/16/2015,2:17 PM   ADDENDUM: Deniece Ree trough therapeutic at 20, though at high end of goal.  Will continue for now but may continue to accumulate; will need another level soon. Wynona Neat, PharmD, BCPS 09/16/2015 10:46 PM

## 2015-09-17 DIAGNOSIS — Z7189 Other specified counseling: Secondary | ICD-10-CM

## 2015-09-17 LAB — BASIC METABOLIC PANEL
Anion gap: 5 (ref 5–15)
BUN: 20 mg/dL (ref 6–20)
CO2: 25 mmol/L (ref 22–32)
CREATININE: 1.46 mg/dL — AB (ref 0.44–1.00)
Calcium: 8.1 mg/dL — ABNORMAL LOW (ref 8.9–10.3)
Chloride: 112 mmol/L — ABNORMAL HIGH (ref 101–111)
GFR calc Af Amer: 45 mL/min — ABNORMAL LOW (ref >60)
GFR, EST NON AFRICAN AMERICAN: 39 mL/min — AB (ref >60)
GLUCOSE: 70 mg/dL (ref 65–99)
Potassium: 3.1 mmol/L — ABNORMAL LOW (ref 3.5–5.1)
SODIUM: 142 mmol/L (ref 135–145)

## 2015-09-17 LAB — CBC
HCT: 26.4 % — ABNORMAL LOW (ref 36.0–46.0)
HEMOGLOBIN: 8.4 g/dL — AB (ref 12.0–15.0)
MCH: 26.8 pg (ref 26.0–34.0)
MCHC: 31.8 g/dL (ref 30.0–36.0)
MCV: 84.1 fL (ref 78.0–100.0)
Platelets: 339 10*3/uL (ref 150–400)
RBC: 3.14 MIL/uL — AB (ref 3.87–5.11)
RDW: 16.8 % — ABNORMAL HIGH (ref 11.5–15.5)
WBC: 6.3 10*3/uL (ref 4.0–10.5)

## 2015-09-17 LAB — CULTURE, BLOOD (ROUTINE X 2): CULTURE: NO GROWTH

## 2015-09-17 LAB — PHOSPHORUS: Phosphorus: 3.7 mg/dL (ref 2.5–4.6)

## 2015-09-17 LAB — MAGNESIUM: MAGNESIUM: 1.7 mg/dL (ref 1.7–2.4)

## 2015-09-17 MED ORDER — PROMETHAZINE HCL 25 MG/ML IJ SOLN
12.5000 mg | Freq: Four times a day (QID) | INTRAMUSCULAR | Status: DC | PRN
  Administered 2015-09-17 – 2015-09-18 (×3): 12.5 mg via INTRAVENOUS
  Filled 2015-09-17 (×3): qty 1

## 2015-09-17 MED ORDER — OXYCODONE HCL 5 MG/5ML PO SOLN
5.0000 mg | Freq: Four times a day (QID) | ORAL | Status: DC | PRN
  Administered 2015-09-18 – 2015-09-20 (×4): 5 mg via ORAL
  Filled 2015-09-17 (×5): qty 5

## 2015-09-17 MED ORDER — POTASSIUM CHLORIDE 10 MEQ/100ML IV SOLN
10.0000 meq | INTRAVENOUS | Status: AC
  Administered 2015-09-17 (×3): 10 meq via INTRAVENOUS
  Filled 2015-09-17 (×3): qty 100

## 2015-09-17 MED ORDER — MORPHINE SULFATE (PF) 2 MG/ML IV SOLN
1.0000 mg | INTRAVENOUS | Status: DC | PRN
  Administered 2015-09-17 – 2015-09-20 (×8): 1 mg via INTRAVENOUS
  Filled 2015-09-17 (×8): qty 1

## 2015-09-17 NOTE — Progress Notes (Signed)
[facility]   Reason for visit: Follow up on MRSA bacteremia  Interval History: repeat blood cultures ngtd.  Palliative care discussion ongoing.  Appears to be headed toward hospice.    Physical Exam: Constitutional:  Filed Vitals:   09/16/15 2219 09/17/15 0615  BP: 137/77 140/86  Pulse: 77 79  Temp: 97.8 F (36.6 C) 98.3 F (36.8 C)  Resp: 18 18   patient appears in NAD Respiratory: Normal respiratory effort; CTA B Cardiovascular: RRR  Review of Systems: Constitutional: negative for fevers and chills Gastrointestinal: negative for nausea and vomiting, has some diarrhea from   Lab Results  Component Value Date   WBC 6.3 09/17/2015   HGB 8.4* 09/17/2015   HCT 26.4* 09/17/2015   MCV 84.1 09/17/2015   PLT 339 09/17/2015    Lab Results  Component Value Date   CREATININE 1.46* 09/17/2015   BUN 20 09/17/2015   NA 142 09/17/2015   K 3.1* 09/17/2015   CL 112* 09/17/2015   CO2 25 09/17/2015    Lab Results  Component Value Date   ALT 13* 09/15/2015   AST 21 09/15/2015   ALKPHOS 287* 09/15/2015     Microbiology: Recent Results (from the past 240 hour(s))  Culture, blood (Routine X 2) w Reflex to ID Panel     Status: None   Collection Time: 09/10/2015  9:17 PM  Result Value Ref Range Status   Specimen Description BLOOD RIGHT ARM  Final   Special Requests IN PEDIATRIC BOTTLE 2CC  Final   Culture NO GROWTH 5 DAYS  Final   Report Status 09/17/2015 FINAL  Final  Culture, blood (Routine X 2) w Reflex to ID Panel     Status: None   Collection Time: 09/23/2015  9:30 PM  Result Value Ref Range Status   Specimen Description BLOOD LEFT ARM  Final   Special Requests BOTTLES DRAWN AEROBIC AND ANAEROBIC 5CC  Final   Culture  Setup Time   Final    GRAM POSITIVE COCCI IN CLUSTERS AEROBIC BOTTLE ONLY CRITICAL RESULT CALLED TO, READ BACK BY AND VERIFIED WITHHenrietta Dine RN 2246 09/13/15 A BROWNING    Culture METHICILLIN RESISTANT STAPHYLOCOCCUS AUREUS   Final   Report Status 09/15/2015 FINAL  Final   Organism ID, Bacteria METHICILLIN RESISTANT STAPHYLOCOCCUS AUREUS  Final      Susceptibility   Methicillin resistant staphylococcus aureus - MIC*    CIPROFLOXACIN <=0.5 SENSITIVE Sensitive     ERYTHROMYCIN >=8 RESISTANT Resistant     GENTAMICIN <=0.5 SENSITIVE Sensitive     OXACILLIN >=4 RESISTANT Resistant     TETRACYCLINE <=1 SENSITIVE Sensitive     VANCOMYCIN 1 SENSITIVE Sensitive     TRIMETH/SULFA <=10 SENSITIVE Sensitive     CLINDAMYCIN <=0.25 SENSITIVE Sensitive     RIFAMPIN <=0.5 SENSITIVE Sensitive     Inducible Clindamycin NEGATIVE Sensitive     * METHICILLIN RESISTANT STAPHYLOCOCCUS AUREUS  Urine culture     Status: None   Collection Time: 09/14/2015 10:22 PM  Result Value Ref Range Status   Specimen Description URINE, CATHETERIZED  Final   Special Requests NONE  Final   Culture 10,000 COLONIES/mL PSEUDOMONAS AERUGINOSA  Final   Report Status 09/15/2015 FINAL  Final   Organism ID, Bacteria PSEUDOMONAS AERUGINOSA  Final      Susceptibility   Pseudomonas aeruginosa - MIC*    CEFTAZIDIME 4 SENSITIVE Sensitive     CIPROFLOXACIN <=0.25 SENSITIVE Sensitive     GENTAMICIN 2 SENSITIVE  Sensitive     IMIPENEM 2 SENSITIVE Sensitive     PIP/TAZO 8 SENSITIVE Sensitive     CEFEPIME 4 SENSITIVE Sensitive     * 10,000 COLONIES/mL PSEUDOMONAS AERUGINOSA  MRSA PCR Screening     Status: Abnormal   Collection Time: 09/13/15  2:21 PM  Result Value Ref Range Status   MRSA by PCR POSITIVE (A) NEGATIVE Final    Comment: RESULT CALLED TO, READ BACK BY AND VERIFIED WITH: RTarry Kos RN 17:25 09/13/15 ([email or web address])        The GeneXpert MRSA Assay (FDA approved for NASAL specimens only), is one component of a comprehensive MRSA colonization surveillance program. It is not intended to diagnose MRSA infection nor to guide or monitor treatment for MRSA infections.   Culture, blood (routine x 2)     Status: None (Preliminary result)   Collection  Time: 09/14/15  2:36 PM  Result Value Ref Range Status   Specimen Description BLOOD LEFT HAND  Final   Special Requests BOTTLES DRAWN AEROBIC AND ANAEROBIC 10CC  Final   Culture NO GROWTH 3 DAYS  Final   Report Status PENDING  Incomplete  Culture, blood (routine x 2)     Status: None (Preliminary result)   Collection Time: 09/14/15  2:39 PM  Result Value Ref Range Status   Specimen Description BLOOD RIGHT HAND  Final   Special Requests BOTTLES DRAWN AEROBIC AND ANAEROBIC 10CC  Final   Culture NO GROWTH 3 DAYS  Final   Report Status PENDING  Incomplete    Impression/Plan:  1. MRSA bactermia - Options are not to continue with antibiotics if she goes to hospice, treat empirically for 6 weeks or TEE and treat for 2 weeks if TEE negative.  2. Hospice - ongoing discussions.

## 2015-09-17 NOTE — Progress Notes (Signed)
Subjective: Still has left facial pain and swelling.  Objective: Vital signs in last 24 hours: Temp:  [97.8 F (36.6 C)-98.3 F (36.8 C)] 98.1 F (36.7 C) (01/16 1520) Pulse Rate:  [77-88] 88 (01/16 1520) Resp:  [18-22] 22 (01/16 1520) BP: (137-150)/(77-90) 150/90 mmHg (01/16 1520) SpO2:  [96 %-98 %] 96 % (01/16 1520)  Physical Exam  Constitutional: She appears cachectic.  Head: Atraumatic.  Ears: Normal auricles and EACs. Nose/Mouth/Throat: Mucous membranes are dry.  Swelling, tenderness of the left parotid gland is noted. Eyes: Conjunctivae are normal. Right eye exhibits no discharge. Left eye exhibits no discharge.  Neck: Normal range of motion. Neck supple.  Cardiovascular: Normal rate, regular rhythm and normal heart sounds.  Pulmonary/Chest: Effort normal and breath sounds normal. No respiratory distress. She has no wheezes.  Skin: Skin is warm and dry.  Psychiatric: She has a normal mood and affect.    Recent Labs  09/16/15 0601 09/17/15 0622  WBC 8.1 6.3  HGB 8.5* 8.4*  HCT 26.2* 26.4*  PLT 322 339    Recent Labs  09/16/15 0601 09/16/15 1840 09/17/15 0622  NA 141  --  142  K 2.8* 3.2* 3.1*  CL 108  --  112*  CO2 26  --  25  GLUCOSE 77  --  70  BUN 24*  --  20  CREATININE 1.45*  --  1.46*  CALCIUM 8.1*  --  8.1*    Medications:  I have reviewed the patient's current medications. Scheduled: . antiseptic oral rinse  7 mL Mouth Rinse BID  . buPROPion  300 mg Oral Daily  . Chlorhexidine Gluconate Cloth  6 each Topical Q0600  . feeding supplement  1 Container Oral BID BM  . feeding supplement (ENSURE ENLIVE)  237 mL Oral BID BM  . multivitamin with minerals  1 tablet Oral Daily  . mupirocin ointment  1 application Nasal BID  . pantoprazole  40 mg Oral Daily  . vancomycin  500 mg Intravenous Q24H  . vitamin B-12  1,000 mcg Oral Daily   [id number] **OR** acetaminophen, benzocaine, diphenhydrAMINE, loratadine, morphine injection,  ondansetron **OR** ondansetron (ZOFRAN) IV, oxyCODONE, promethazine, traMADol  Assessment/Plan: Acute left parotitis, secondary to severe dehydration. WBC has normalized. Continue IV fluid and IV abx. No palpable abscess.   LOS: 5 days   Maeghan Canny,SUI W 09/17/2015, 5:18 PM

## 2015-09-17 NOTE — Progress Notes (Signed)
PROGRESS NOTE  Autumn Turner [id number] DOB: 08-04-1961 DOA: 09/21/2015 PCP: Robyn Haber, MD  Assessment/Plan: Staph aureus bacteremia -IV vanc -ID following -echo-- family does not want TEE -blood cultures repeated and NGTD  Acute renal failure probably from poor oral intake and dehydration -  d5IVF- decrease amount  Skin rash: ? 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 -patient is going to try to eat better -does NOT want NG tube -GI doc has mentioned J tube in past- ? Defer to palliative Albumin < 1  Elevated alkaline phosphatase levels - cause not known. Closely observe.  pseudomonas in urine culture -<10,000 colonies -no treatment  appreciate palliative care-- need to decide if treatment to be finished and will need PICC, nutrition status poor, so will need feeding tube  Code Status: DNR Family Communication: patient/husband/daughter/mother at beside 1/16 Disposition Plan:    Consultants:  ID  Palliative care  PT  Procedures:      HPI/Subjective: Very nauseous today  Objective: Filed Vitals:   09/17/15 0615 09/17/15 1520  BP: 140/86 150/90  Pulse: 79 88  Temp: 98.3 F (36.8 C) 98.1 F (36.7 C)  Resp: 18 22    Intake/Output Summary (Last 24 hours) at 09/17/15 1714 Last data filed at 09/17/15 1633  Gross per 24 hour  Intake 3886.67 ml  Output    700 ml  Net 3186.67 ml   Filed Weights   09/13/15 1406  Weight: 47.945 kg (105 lb 11.2 oz)    Exam:   General:  Awake, ill appearing  Cardiovascular: rrr  Respiratory: clear  Abdomen: thin, + BS  Musculoskeletal: +  edema  Data Reviewed: Basic Metabolic Panel:  Recent Labs Lab 09/13/15 0308 09/14/15 0658 09/15/15 0605 09/16/15 0601 09/16/15 1840 09/17/15 0622  NA 133* 135 135 141  --  142  K 3.4* 3.2* 3.5 2.8* 3.2* 3.1*  CL 99* 102 100* 108  --  112*  CO2 25 24 24 26   --  25  GLUCOSE 63* 38* 54* 77  --  70  BUN 40* 38* 32* 24*  --  20  CREATININE 1.62* 1.59* 1.58* 1.45*  --  1.46*  CALCIUM 7.9* 8.1* 8.2* 8.1*  --  8.1*  MG  --   --  1.4* 1.9  --  1.7  PHOS  --   --  4.1 3.8  --  3.7   Liver Function Tests:  Recent Labs Lab 09/24/2015 1625 09/13/15 0308 09/14/15 0658 09/15/15 0605  AST 19 15 15 21   ALT 16 12* 14 13*  ALKPHOS 368* 291* 283* 287*  BILITOT 0.5 0.5 0.6 1.0  PROT 5.6* 4.4* 4.9* 4.6*  ALBUMIN 1.2* <1.0* <1.0* <1.0*    Recent Labs Lab 09/21/2015 1625  LIPASE 14   No results for input(s): AMMONIA in the last 168 hours. CBC:  Recent Labs Lab 09/13/15 0308 09/14/15 0658 09/15/15 0605 09/16/15 0601 09/17/15 0622  WBC 17.3* 25.2* 15.0* 8.1 6.3  NEUTROABS 15.8*  --   --   --   --   HGB 6.1* 9.3* 8.6* 8.5* 8.4*  HCT 20.2* 27.4* 26.9* 26.2* 26.4*  MCV 82.8 81.5 84.3 84.5 84.1  PLT 437* 413*  375 322 339   Cardiac Enzymes:  Recent Labs Lab 10/01/2015 2242  CKTOTAL 15*   BNP (last 3 results) No results for input(s): BNP in the last 8760 hours.  ProBNP (last 3 results) No results for input(s): PROBNP in the last 8760 hours.  CBG:  Recent Labs Lab 09/14/15 0957 09/15/15 0847  GLUCAP 78 83    Recent Results (from the past 240 hour(s))  Culture, blood (Routine X 2) w Reflex to ID Panel     Status: None   Collection Time: 10/02/2015  9:17 PM  Result Value Ref Range Status   Specimen Description BLOOD RIGHT ARM  Final   Special Requests IN PEDIATRIC BOTTLE 2CC  Final   Culture NO GROWTH 5 DAYS  Final   Report Status 09/17/2015 FINAL  Final  Culture, blood (Routine X 2) w Reflex to ID Panel     Status: None   Collection Time: 09/04/2015  9:30 PM  Result  Value Ref Range Status   Specimen Description BLOOD LEFT ARM  Final   Special Requests BOTTLES DRAWN AEROBIC AND ANAEROBIC 5CC  Final   Culture  Setup Time   Final    GRAM POSITIVE COCCI IN CLUSTERS AEROBIC BOTTLE ONLY CRITICAL RESULT CALLED TO, READ BACK BY AND VERIFIED WITHHenrietta Dine RN 2246 09/13/15 A BROWNING    Culture METHICILLIN RESISTANT STAPHYLOCOCCUS AUREUS  Final   Report Status 09/15/2015 FINAL  Final   Organism ID, Bacteria METHICILLIN RESISTANT STAPHYLOCOCCUS AUREUS  Final      Susceptibility   Methicillin resistant staphylococcus aureus - MIC*    CIPROFLOXACIN <=0.5 SENSITIVE Sensitive     ERYTHROMYCIN >=8 RESISTANT Resistant     GENTAMICIN <=0.5 SENSITIVE Sensitive     OXACILLIN >=4 RESISTANT Resistant     TETRACYCLINE <=1 SENSITIVE Sensitive     VANCOMYCIN 1 SENSITIVE Sensitive     TRIMETH/SULFA <=10 SENSITIVE Sensitive     CLINDAMYCIN <=0.25 SENSITIVE Sensitive     RIFAMPIN <=0.5 SENSITIVE Sensitive     Inducible Clindamycin NEGATIVE Sensitive     * METHICILLIN RESISTANT STAPHYLOCOCCUS AUREUS  Urine culture     Status: None   Collection Time: 09/26/2015 10:22 PM  Result Value Ref Range Status   Specimen Description URINE, CATHETERIZED  Final   Special Requests NONE  Final   Culture 10,000 COLONIES/mL PSEUDOMONAS AERUGINOSA  Final   Report Status 09/15/2015 FINAL  Final   Organism ID, Bacteria PSEUDOMONAS AERUGINOSA  Final      Susceptibility   Pseudomonas aeruginosa - MIC*    CEFTAZIDIME 4 SENSITIVE Sensitive     CIPROFLOXACIN <=0.25 SENSITIVE Sensitive     GENTAMICIN 2 SENSITIVE Sensitive     IMIPENEM 2 SENSITIVE Sensitive     PIP/TAZO 8 SENSITIVE Sensitive     CEFEPIME 4 SENSITIVE Sensitive     * 10,000 COLONIES/mL PSEUDOMONAS AERUGINOSA  MRSA PCR Screening     Status: Abnormal   Collection Time: 09/13/15  2:21 PM  Result Value Ref Range Status   MRSA by PCR POSITIVE (A) NEGATIVE Final    Comment: RESULT CALLED TO, READ BACK BY AND VERIFIED WITH: Donnal Debar RN 17:25 09/13/15 ([email or web address])        The GeneXpert MRSA Assay (FDA approved for NASAL specimens only), is one component of a comprehensive MRSA colonization surveillance program. It is not intended to diagnose MRSA infection nor to guide or monitor treatment for MRSA infections.   Culture, blood (routine x 2)     Status: None (  Preliminary result)   Collection Time: 09/14/15  2:36 PM  Result Value Ref Range Status   Specimen Description BLOOD LEFT HAND  Final   Special Requests BOTTLES DRAWN AEROBIC AND ANAEROBIC 10CC  Final   Culture NO GROWTH 3 DAYS  Final   Report Status PENDING  Incomplete  Culture, blood (routine x 2)     Status: None (Preliminary result)   Collection Time: 09/14/15  2:39 PM  Result Value Ref Range Status   Specimen Description BLOOD RIGHT HAND  Final   Special Requests BOTTLES DRAWN AEROBIC AND ANAEROBIC 10CC  Final   Culture NO GROWTH 3 DAYS  Final   Report Status PENDING  Incomplete     Studies: No results found.  Scheduled Meds: . antiseptic oral rinse  7 mL Mouth Rinse BID  . buPROPion  300 mg Oral Daily  . Chlorhexidine Gluconate Cloth  6 each Topical Q0600  . feeding supplement  1 Container Oral BID BM  . feeding supplement (ENSURE ENLIVE)  237 mL Oral BID BM  . multivitamin with minerals  1 tablet Oral Daily  . mupirocin ointment  1 application Nasal BID  . pantoprazole  40 mg Oral Daily  . vancomycin  500 mg Intravenous Q24H  . vitamin B-12  1,000 mcg Oral Daily   Continuous Infusions: . dextrose 5 % and 0.9 % NaCl with KCl 20 mEq/L 50 mL/hr at 09/17/15 1452   Antibiotics Given (last 72 hours)    Date/Time Action Medication Dose Rate   09/14/15 1739 Given   piperacillin-tazobactam (ZOSYN) IVPB 3.375 g 3.375 g 12.5 mL/hr   09/15/15 0101 Given   piperacillin-tazobactam (ZOSYN) IVPB 3.375 g 3.375 g 12.5 mL/hr   09/15/15 [id number] Given   vancomycin (VANCOCIN) 500 mg in sodium chloride 0.9 % 100 mL IVPB 500 mg 100 mL/hr   09/15/15  1400 Given   piperacillin-tazobactam (ZOSYN) IVPB 3.375 g 3.375 g 12.5 mL/hr   09/15/15 2121 Given   vancomycin (VANCOCIN) 500 mg in sodium chloride 0.9 % 100 mL IVPB 500 mg 100 mL/hr   09/16/15 0017 Given   piperacillin-tazobactam (ZOSYN) IVPB 3.375 g 3.375 g 12.5 mL/hr   09/16/15 1353 Given   piperacillin-tazobactam (ZOSYN) IVPB 3.375 g 3.375 g 12.5 mL/hr   09/16/15 2136 Given   vancomycin (VANCOCIN) 500 mg in sodium chloride 0.9 % 100 mL IVPB 500 mg 100 mL/hr      Principal Problem:   AKI (acute kidney injury) ([place]) Active Problems:   Hypokalemia   Protein-calorie malnutrition, severe   Pressure ulcer   Leukocytoclastic vasculitis ([place])   Acute parotitis   ARF (acute renal failure) ([place])   Staphylococcus aureus bacteremia   Dehydration   Encounter for palliative care   Jaw pain   Goals of care, counseling/discussion    Time spent: 25 min    [facility] Pager [phone]. If 7PM-7AM, please contact night-coverage at [email or web address], password [facility] 09/17/2015, 5:14 PM  LOS: 5 days

## 2015-09-18 LAB — PHOSPHORUS: PHOSPHORUS: 3.5 mg/dL (ref 2.5–4.6)

## 2015-09-18 LAB — MAGNESIUM: MAGNESIUM: 1.5 mg/dL — AB (ref 1.7–2.4)

## 2015-09-18 MED ORDER — SENNOSIDES-DOCUSATE SODIUM 8.6-50 MG PO TABS: 1.0000 | ORAL_TABLET | Freq: Every evening | ORAL | Status: DC | PRN

## 2015-09-18 MED ORDER — MAGNESIUM SULFATE 2 GM/50ML IV SOLN
2.0000 g | Freq: Once | INTRAVENOUS | Status: AC
  Administered 2015-09-18: 2 g via INTRAVENOUS
  Filled 2015-09-18: qty 50

## 2015-09-18 MED ORDER — LORAZEPAM 0.5 MG PO TABS
0.5000 mg | ORAL_TABLET | Freq: Once | ORAL | Status: DC
  Filled 2015-09-18: qty 1

## 2015-09-18 NOTE — Clinical Documentation Improvement (Signed)
Hospitalist   Would you please further specify term of bacteremia from staph aureus?  And was diagnosis present on admission?   Sepsis due to acute parotitis with growth of MRSA in one blood culture on 09/11/14  Sepsis due to acute parotitis with growth of MRSA in one blood culture on 09/11/14  Other  Clinically Undetermined  Document any associated diagnoses/conditions. - acute renal failure due to dehydration.   Supporting Information: On admission BP was 112/74, Pulse 94, Temp 97.5, resp 18; WBC 17.7.  On 1/13 WBC went to 25.2 and 15 on 1/14. Lactic acid was 1.18 on admission and went to 1.63   Please exercise your independent, professional judgment when responding. A specific answer is not anticipated or expected.   Thank You,  [place] [phone]

## 2015-09-18 NOTE — Progress Notes (Addendum)
PROGRESS NOTE  Autumn Turner [id number] DOB: 10/02/1960 DOA: 09/10/2015 PCP: Robyn Haber, MD  Autumn Turner is a 55 y.o. female was recently admitted to the hospital for cavitary pneumonia had followed up with Dr. Johnnye Sima in his clinic yesterday when patient was found to be very weak and was referred to the ER. Labs revealed acute renal failure with anemia with hemoglobin at her baseline. In addition patient is found to have a new rash involving the extremities. Denies any itching. There is some involvement of the oral mucosa but no congestion of the eyes. Patient was recently found to have increasing swelling of the left parotid over the last 2 days. The skin rash been coming off and on for last 1 week. Patient has history of gastric bypass and outlet obstruction and was admitted for nausea vomiting recently. During last admission patient's sputum grew MRSA and patient was placed on Zyvox and discharged on clindamycin. Patient during that stay also had thoracentesis done.  Found to have MRSA bacteremia.  In talks with palliative care regarding plan from this point forward.  Family was interested in speak with GI regarding J tube   Assessment/Plan: Staph aureus bacteremia -IV vanc 6 weeks per ID -ID following -echo-- family does not want TEE -blood cultures repeated and NGTD -place PICC  Acute renal failure probably from poor oral intake and dehydration -  d5IVF- decrease amount  Skin rash: ? 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 -patient is going to try to eat better -does NOT want NG tube -GI doc has mentioned J tube in past-  asked GI to speak with family - called in to [place] on 1/17  Albumin < 1  Elevated alkaline phosphatase levels - cause not known. Closely observe.  pseudomonas in urine culture -<10,000 colonies -no treatment  appreciate palliative care  Code Status: DNR Family Communication: patient/husband/daughter/son at beside 1/17 Disposition Plan: SNF with palliative   Consultants:  ID  Palliative care  PT  Procedures:      HPI/Subjective: Nausea better this AM Still not eating  Objective: Filed Vitals:   09/17/15 2245 09/18/15 0544  BP: 134/96 125/92  Pulse: 107 99  Temp: 98.1 F (36.7 C) 98 F (36.7 C)  Resp: 20 20    Intake/Output Summary (Last 24 hours) at 09/18/15 1257 Last data filed at 09/18/15 0646  Gross per 24 hour  Intake   4685 ml  Output   1250 ml  Net   3435 ml   Filed Weights   09/13/15 1406  Weight: 47.945 kg (105 lb 11.2 oz)    Exam:   General:  Awake, ill appearing  Cardiovascular: rrr  Respiratory: clear  Abdomen: thin, + BS  Musculoskeletal: + edema  Data Reviewed: Basic Metabolic Panel:  Recent Labs Lab 09/13/15 0308 09/14/15 0658 09/15/15 0605 09/16/15 0601 09/16/15 1840 09/17/15 0622 09/18/15 0814  NA 133* 135 135 141  --  142  --   K 3.4* 3.2* 3.5 2.8* 3.2* 3.1*  --   CL 99* 102 100* 108  --  112*  --   CO2 25 24 24 26   --  25  --   GLUCOSE 63*  38* 54* 77  --  70  --   BUN 40* 38* 32* 24*  --  20  --   CREATININE 1.62* 1.59* 1.58* 1.45*  --  1.46*  --   CALCIUM 7.9* 8.1* 8.2* 8.1*  --  8.1*  --   MG  --   --  1.4* 1.9  --  1.7 1.5*  PHOS  --   --  4.1 3.8  --  3.7 3.5   Liver Function Tests:  Recent Labs Lab 09/16/2015 1625 09/13/15 0308 09/14/15 0658 09/15/15 0605  AST 19 15 15 21   ALT 16 12* 14 13*  ALKPHOS 368* 291* 283* 287*  BILITOT 0.5 0.5 0.6 1.0  PROT 5.6* 4.4* 4.9* 4.6*  ALBUMIN 1.2* <1.0* <1.0* <1.0*    Recent Labs Lab 09/02/2015 1625  LIPASE 14   No results for input(s): AMMONIA in the  last 168 hours. CBC:  Recent Labs Lab 09/13/15 0308 09/14/15 0658 09/15/15 0605 09/16/15 0601 09/17/15 0622  WBC 17.3* 25.2* 15.0* 8.1 6.3  NEUTROABS 15.8*  --   --   --   --   HGB 6.1* 9.3* 8.6* 8.5* 8.4*  HCT 20.2* 27.4* 26.9* 26.2* 26.4*  MCV 82.8 81.5 84.3 84.5 84.1  PLT 437* 413* 375 322 339   Cardiac Enzymes:  Recent Labs Lab 10/02/2015 2242  CKTOTAL 15*   BNP (last 3 results) No results for input(s): BNP in the last 8760 hours.  ProBNP (last 3 results) No results for input(s): PROBNP in the last 8760 hours.  CBG:  Recent Labs Lab 09/14/15 0957 09/15/15 0847  GLUCAP 78 83    Recent Results (from the past 240 hour(s))  Culture, blood (Routine X 2) w Reflex to ID Panel     Status: None   Collection Time: 09/14/2015  9:17 PM  Result Value Ref Range Status   Specimen Description BLOOD RIGHT ARM  Final   Special Requests IN PEDIATRIC BOTTLE 2CC  Final   Culture NO GROWTH 5 DAYS  Final   Report Status 09/17/2015 FINAL  Final  Culture, blood (Routine X 2) w Reflex to ID Panel     Status: None   Collection Time: 09/29/2015  9:30 PM  Result Value Ref Range Status   Specimen Description BLOOD LEFT ARM  Final   Special Requests BOTTLES DRAWN AEROBIC AND ANAEROBIC 5CC  Final   Culture  Setup Time   Final    GRAM POSITIVE COCCI IN CLUSTERS AEROBIC BOTTLE ONLY CRITICAL RESULT CALLED TO, READ BACK BY AND VERIFIED WITHHenrietta Dine RN [id number] 09/13/15 A BROWNING    Culture METHICILLIN RESISTANT STAPHYLOCOCCUS AUREUS  Final   Report Status 09/15/2015 FINAL  Final   Organism ID, Bacteria METHICILLIN RESISTANT STAPHYLOCOCCUS AUREUS  Final      Susceptibility   Methicillin resistant staphylococcus aureus - MIC*    CIPROFLOXACIN <=0.5 SENSITIVE Sensitive     ERYTHROMYCIN >=8 RESISTANT Resistant     GENTAMICIN <=0.5 SENSITIVE Sensitive     OXACILLIN >=4 RESISTANT Resistant     TETRACYCLINE <=1 SENSITIVE Sensitive     VANCOMYCIN 1 SENSITIVE Sensitive     TRIMETH/SULFA <=10  SENSITIVE Sensitive     CLINDAMYCIN <=0.25 SENSITIVE Sensitive     RIFAMPIN <=0.5 SENSITIVE Sensitive     Inducible Clindamycin NEGATIVE Sensitive     * METHICILLIN RESISTANT STAPHYLOCOCCUS AUREUS  Urine culture     Status: None   Collection Time: 09/04/2015 10:22 PM  Result Value Ref Range Status  Specimen Description URINE, CATHETERIZED  Final   Special Requests NONE  Final   Culture 10,000 COLONIES/mL PSEUDOMONAS AERUGINOSA  Final   Report Status 09/15/2015 FINAL  Final   Organism ID, Bacteria PSEUDOMONAS AERUGINOSA  Final      Susceptibility   Pseudomonas aeruginosa - MIC*    CEFTAZIDIME 4 SENSITIVE Sensitive     CIPROFLOXACIN <=0.25 SENSITIVE Sensitive     GENTAMICIN 2 SENSITIVE Sensitive     IMIPENEM 2 SENSITIVE Sensitive     PIP/TAZO 8 SENSITIVE Sensitive     CEFEPIME 4 SENSITIVE Sensitive     * 10,000 COLONIES/mL PSEUDOMONAS AERUGINOSA  MRSA PCR Screening     Status: Abnormal   Collection Time: 09/13/15  2:21 PM  Result Value Ref Range Status   MRSA by PCR POSITIVE (A) NEGATIVE Final    Comment: RESULT CALLED TO, READ BACK BY AND VERIFIED WITH: Donnal Debar RN 17:25 09/13/15 ([email or web address])        The GeneXpert MRSA Assay (FDA approved for NASAL specimens only), is one component of a comprehensive MRSA colonization surveillance program. It is not intended to diagnose MRSA infection nor to guide or monitor treatment for MRSA infections.   Culture, blood (routine x 2)     Status: None (Preliminary result)   Collection Time: 09/14/15  2:36 PM  Result Value Ref Range Status   Specimen Description BLOOD LEFT HAND  Final   Special Requests BOTTLES DRAWN AEROBIC AND ANAEROBIC 10CC  Final   Culture NO GROWTH 4 DAYS  Final   Report Status PENDING  Incomplete  Culture, blood (routine x 2)     Status: None (Preliminary result)   Collection Time: 09/14/15  2:39 PM  Result Value Ref Range Status   Specimen Description BLOOD RIGHT HAND  Final   Special Requests BOTTLES DRAWN  AEROBIC AND ANAEROBIC 10CC  Final   Culture NO GROWTH 4 DAYS  Final   Report Status PENDING  Incomplete     Studies: No results found.  Scheduled Meds: . antiseptic oral rinse  7 mL Mouth Rinse BID  . buPROPion  300 mg Oral Daily  . Chlorhexidine Gluconate Cloth  6 each Topical Q0600  . feeding supplement  1 Container Oral BID BM  . feeding supplement (ENSURE ENLIVE)  237 mL Oral BID BM  . multivitamin with minerals  1 tablet Oral Daily  . pantoprazole  40 mg Oral Daily  . vancomycin  500 mg Intravenous Q24H  . vitamin B-12  1,000 mcg Oral Daily   Continuous Infusions: . dextrose 5 % and 0.9 % NaCl with KCl 20 mEq/L 50 mL/hr at 09/18/15 0937   Antibiotics Given (last 72 hours)    Date/Time Action Medication Dose Rate   09/15/15 1400 Given   piperacillin-tazobactam (ZOSYN) IVPB 3.375 g 3.375 g 12.5 mL/hr   09/15/15 2121 Given   vancomycin (VANCOCIN) 500 mg in sodium chloride 0.9 % 100 mL IVPB 500 mg 100 mL/hr   09/16/15 0017 Given   piperacillin-tazobactam (ZOSYN) IVPB 3.375 g 3.375 g 12.5 mL/hr   09/16/15 1353 Given   piperacillin-tazobactam (ZOSYN) IVPB 3.375 g 3.375 g 12.5 mL/hr   09/16/15 2136 Given   vancomycin (VANCOCIN) 500 mg in sodium chloride 0.9 % 100 mL IVPB 500 mg 100 mL/hr   09/17/15 2100 Given   vancomycin (VANCOCIN) 500 mg in sodium chloride 0.9 % 100 mL IVPB 500 mg 100 mL/hr      Principal Problem:   AKI (acute kidney injury) ([place]) Active Problems:  Hypokalemia   Protein-calorie malnutrition, severe   Pressure ulcer   Leukocytoclastic vasculitis (HCC)   Acute parotitis   ARF (acute renal failure) ([place])   Staphylococcus aureus bacteremia   Dehydration   Encounter for palliative care   Jaw pain   Goals of care, counseling/discussion    Time spent: 25 min    [facility] Pager [phone]. If 7PM-7AM, please contact night-coverage at [email or web address], password [facility] 09/18/2015, 12:57 PM  LOS: 6 days

## 2015-09-18 NOTE — Progress Notes (Signed)
PT Cancellation Note  Patient Details Name: Autumn Turner MRN: [id number] DOB: October 18, 1960   Cancelled Treatment:    Reason Eval/Treat Not Completed: Patient declined, no reason specified (Family reports she was vomiting yesterday, not up for it).  Try again tomorrow if pt able.   Ramond Dial 09/18/2015, 1:32 PM   Mee Hives, PT MS Acute Rehab Dept. Number: [facility] [id number] and [place] [phone]

## 2015-09-18 NOTE — Progress Notes (Signed)
Subjective: Sleepy and slightly confused. No new complaint. Left face is still tender.  Objective: Vital signs in last 24 hours: Temp:  [98 F (36.7 C)-98.1 F (36.7 C)] 98 F (36.7 C) (01/17 0544) Pulse Rate:  [88-107] 99 (01/17 0544) Resp:  [20-22] 20 (01/17 0544) BP: (125-150)/(90-96) 125/92 mmHg (01/17 0544) SpO2:  [96 %-100 %] 99 % (01/17 0544)  Physical Exam  Constitutional: She appears cachectic.  Head: Atraumatic.  Ears: Normal auricles and EACs. Nose/Mouth/Throat: Mucous membranes are dry.  Swelling, tenderness of the left parotid gland has improved. Eyes: Conjunctivae are normal. Right eye exhibits no discharge. Left eye exhibits no discharge.  Neck: Normal range of motion. Neck supple.  Cardiovascular: Normal rate, regular rhythm and normal heart sounds.  Pulmonary/Chest: Effort normal and breath sounds normal. No respiratory distress. She has no wheezes.  Skin: Skin is warm and dry.     Recent Labs  09/16/15 0601 09/17/15 0622  WBC 8.1 6.3  HGB 8.5* 8.4*  HCT 26.2* 26.4*  PLT 322 339    Recent Labs  09/16/15 0601 09/16/15 1840 09/17/15 0622  NA 141  --  142  K 2.8* 3.2* 3.1*  CL 108  --  112*  CO2 26  --  25  GLUCOSE 77  --  70  BUN 24*  --  20  CREATININE 1.45*  --  1.46*  CALCIUM 8.1*  --  8.1*    Medications:  I have reviewed the patient's current medications. Scheduled: . antiseptic oral rinse  7 mL Mouth Rinse BID  . buPROPion  300 mg Oral Daily  . Chlorhexidine Gluconate Cloth  6 each Topical Q0600  . feeding supplement  1 Container Oral BID BM  . feeding supplement (ENSURE ENLIVE)  237 mL Oral BID BM  . multivitamin with minerals  1 tablet Oral Daily  . pantoprazole  40 mg Oral Daily  . vancomycin  500 mg Intravenous Q24H  . vitamin B-12  1,000 mcg Oral Daily   [id number] **OR** acetaminophen, benzocaine, diphenhydrAMINE, loratadine, morphine injection, ondansetron **OR** ondansetron (ZOFRAN) IV, oxyCODONE,  promethazine, senna-docusate, traMADol  Assessment/Plan: Acute left parotitis, secondary to severe dehydration. WBC has normalized. Continue IV fluid and IV abx. No palpable abscess.   LOS: 6 days   Zonnique Norkus,SUI W 09/18/2015, 10:40 AM

## 2015-09-18 NOTE — NC FL2 (Signed)
[place] LEVEL OF CARE SCREENING TOOL     IDENTIFICATION  Patient Name: Autumn Turner Birthdate: May 12, 1961 Sex: female Admission Date (Current Location): 09/28/2015  [facility] and [place] Number:  Herbalist and Address:  [facility], [place] [address] [place], [place] [address]      Provider Number: [id number]  Attending Physician Name and Address:  Geradine Girt, DO  Relative Name and Phone Number:  [name], spouse, [phone]    Current Level of Care: Hospital Recommended Level of Care: [place] Prior Approval Number:    Date Approved/Denied:   PASRR Number:    Discharge Plan: SNF    Current Diagnoses: Patient Active Problem List   Diagnosis Date Noted  . Encounter for palliative care   . Jaw pain   . Goals of care, counseling/discussion   . Staphylococcus aureus bacteremia 09/14/2015  . Dehydration   . Leukocytoclastic vasculitis ([place]) 09/13/2015  . Acute parotitis 09/13/2015  . ARF (acute renal failure) ([place]) 09/13/2015  . Failure to thrive in adult   . Bulimia nervosa 09/07/2015  . AKI (acute kidney injury) ([place]) 09/07/2015  . Sialadenitis 09/11/2015  . Hypoalbuminemia 09/11/2015  . Weakness 09/10/2015  . 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 Self induced vomiting 08/24/2015  . Narcotic drug use 08/24/2015  . Edema 08/23/2015  . Non compliance with medical treatment 08/22/2015  . Cognitive impairment 08/22/2015  . Depression 08/21/2015  . GERD (gastroesophageal reflux disease) 08/21/2015  . Vitamin D deficiency 08/21/2015  . Pressure ulcer 08/20/2015  . Protein-calorie malnutrition, severe 08/17/2015  . Pneumonia of right lung due to methicillin resistant Staphylococcus aureus (MRSA) ([place]) 08/17/2015  . Acute respiratory failure with hypoxia ([place]) 08/14/2015  . Necrotizing pneumonia ([place]) 08/14/2015  . Microcytic anemia 08/14/2015  . Sepsis ([place]) 08/14/2015  . Pleural effusion   .  HCAP (healthcare-associated pneumonia) 08/13/2015  . Hypokalemia 08/13/2015  . Anemia 08/13/2015  . Abdominal pain 08/13/2015  . 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 Cellulitis of groin, right 12/16/2012  . Vulvar cancer ([place]) 11/18/2012    Orientation RESPIRATION BLADDER Height & Weight    Self, Time, Situation, Place  O2 (2L) Continent      BEHAVIORAL SYMPTOMS/MOOD NEUROLOGICAL BOWEL NUTRITION STATUS   (N/A)  (N/A) Continent  (Please See DC summary)  AMBULATORY STATUS COMMUNICATION OF NEEDS Skin   Extensive Assist Verbally PU Stage and Appropriate Care (Unstageable PU on coccyx; Stage II on buttocks)                       Personal Care Assistance Level of Assistance  Bathing, Feeding, Dressing Bathing Assistance: Maximum assistance Feeding assistance: Limited assistance Dressing Assistance: Limited assistance     Functional Limitations Info             SPECIAL CARE FACTORS FREQUENCY  PT (By licensed PT)     PT Frequency: min 3x/week              Contractures      Additional Factors Info  Code Status, Allergies, Isolation Precautions Code Status Info: DNR Allergies Info: Carafate, Ciprocinonide, Ciprofloxacin, Dust Mite Extract, Iohexol, Nsaids     Isolation Precautions Info: MRSA     Current Medications (09/18/2015):  This is the current hospital active medication list Current Facility-Administered Medications  Medication Dose Route Frequency Provider Last Rate Last Dose  . acetaminophen (TYLENOL) tablet 650 mg  650 mg Oral Q6H PRN Rise Patience,  MD       Or  . acetaminophen (TYLENOL) suppository 650 mg  650 mg Rectal Q6H PRN Rise Patience, MD      . antiseptic oral rinse (CPC / CETYLPYRIDINIUM CHLORIDE 0.05%) solution 7 mL  7 mL Mouth Rinse BID Geradine Girt, DO   7 mL at 09/18/15 0939  . benzocaine (ORAJEL) 10 % mucosal gel   Mouth/Throat QID PRN Loistine Chance, MD      . buPROPion (WELLBUTRIN XL) 24 hr tablet 300 mg  300 mg Oral Daily  Rise Patience, MD   300 mg at 09/18/15 0937  . Chlorhexidine Gluconate Cloth 2 % PADS 6 each  6 each Topical Q0600 Geradine Girt, DO   6 each at 09/18/15 0551  . dextrose 5 % and 0.9 % NaCl with KCl 20 mEq/L infusion   Intravenous Continuous Geradine Girt, DO 50 mL/hr at 09/18/15 [id number]    . diphenhydrAMINE (BENADRYL) 12.5 MG/5ML elixir 12.5 mg  12.5 mg Oral QID PRN Rise Patience, MD      . feeding supplement (BOOST / RESOURCE BREEZE) liquid 1 Container  1 Container Oral BID BM Domenick Bookbinder, RD   1 Container at 09/18/15 1359  . feeding supplement (ENSURE ENLIVE) (ENSURE ENLIVE) liquid 237 mL  237 mL Oral BID BM Jessica U Vann, DO   237 mL at 09/18/15 1400  . loratadine (CLARITIN) tablet 10 mg  10 mg Oral Daily PRN Rise Patience, MD      . morphine 2 MG/ML injection 1 mg  1 mg Intravenous Q3H PRN Geradine Girt, DO   1 mg at 09/18/15 1802  . multivitamin with minerals tablet 1 tablet  1 tablet Oral Daily Domenick Bookbinder, RD   1 tablet at 09/15/15 0944  . ondansetron (ZOFRAN) tablet 4 mg  4 mg Oral Q6H PRN Rise Patience, MD       Or  . ondansetron [facility]) injection 4 mg  4 mg Intravenous Q6H PRN Rise Patience, MD   4 mg at 09/18/15 1354  . oxyCODONE (ROXICODONE) 5 MG/5ML solution 5 mg  5 mg Oral Q6H PRN Mickel Baas A [place], PA-C      . pantoprazole (PROTONIX) EC tablet 40 mg  40 mg Oral Daily Rise Patience, MD   40 mg at 09/18/15 0936  . promethazine (PHENERGAN) injection 12.5 mg  12.5 mg Intravenous Q6H PRN Geradine Girt, DO   12.5 mg at 09/17/15 2051  . senna-docusate (Senokot-S) tablet 1 tablet  1 tablet Oral QHS PRN Loistine Chance, MD      . traMADol (ULTRAM) tablet 50 mg  50 mg Oral Q6H PRN Rise Patience, MD   50 mg at 09/14/15 1747  . vancomycin (VANCOCIN) 500 mg in sodium chloride 0.9 % 100 mL IVPB  500 mg Intravenous Q24H Rise Patience, MD   500 mg at 09/17/15 2100  . vitamin B-12 (CYANOCOBALAMIN) tablet 1,000 mcg  1,000 mcg Oral Daily Rise Patience, MD   1,000 mcg at 09/16/15 0909     Discharge Medications: Please see discharge summary for a list of discharge medications.  Relevant Imaging Results:  Relevant Lab Results:   Additional [place] Abhiram Criado, LCSWA

## 2015-09-18 NOTE — Care Management Important Message (Signed)
Important Message  Patient Details  Name: [name] MRN: [id number] Date of Birth: [birth year]   Medicare Important Message Given:  Yes    Carles Collet, RN 09/18/2015, 11:48 AMImportant Message  Patient Details  Name: [name] MRN: [id number] Date of Birth: 1961/03/01   Medicare Important Message Given:  Yes    Carles Collet, RN 09/18/2015, 11:48 AM

## 2015-09-18 NOTE — Progress Notes (Addendum)
Paged dr Eliseo Squires about PICC placement. Awaiting response  12:06 PM order for PICC recieved

## 2015-09-18 NOTE — Consult Note (Signed)
Referring Provider: Dr. Eliseo Squires Primary Care Physician:  Robyn Haber, MD Primary Gastroenterologist:  Dr. Watt Climes  Reason for Consultation:  Feeding difficulties  HPI: Autumn Turner is a 55 y.o. female who has a history of a gastric outlet stenosis in the setting of a gastric bypass in 2001 and revision in 2013. Revision was reportedly done due to anastomotic ulcers and stenosis at [facility] per her husband. On 06/29/15 she had balloon dilation of her anastomosis done to 14 mm and then on 07/17/15 she had repeat balloon dilation to 13.5 mm and then placement of a stent across her gastrojejunostomy using a 10 cm X 18 mm covered stent. Xray done on 07/30/16 showed that the stent was in place. Stent removed last month following her admission of pneumonia with worsened abdominal pain/N/V/weight loss. Husband reports that she has not had any good PO intake since early December. She reports having to puree her food following the stent placement. Treated with IV Vanco for Staph aureus bacteremia. Recent hospitalization last month for pneumonia. History aided by her husband.   Past Medical History  Diagnosis Date  . Abnormal Pap smear   . Vulvar lesion   . CIN II (cervical intraepithelial neoplasia II)   . Malnourished ([place])   . Sleep apnea     Loss weight with gastric bypass  . Cancer ([place])     vulvar  . Anemia     blood transfusion  . Hypertension     no longer on medication  . Anxiety   . Depression   . History of stomach ulcers     Past Surgical History  Procedure Laterality Date  . Cesarean section  1987; 1989  . Gastric bypass open  2001  . Laparoscopic cholecystectomy  2002  . Abdominoplasty  2004    "tummy tuck"  . Leep  06/2009  . Cholecystectomy    . Tonsillectomy and adenoidectomy  1967  . Vulvectomy Bilateral 12/07/2012    Procedure: VULVECTOMY AND BILATERAL INGUINAL LYMPH NODE DISSECTION, PAP SMEAR;  Surgeon: Janie Morning, MD;  Location: [facility] ORS;  Service: Gynecology;   Laterality: Bilateral;  . Revision gastric restrictive procedure for morbid obesity    . Esophagogastroduodenoscopy N/A 06/29/2015    Procedure: ESOPHAGOGASTRODUODENOSCOPY (EGD);  Surgeon: Clarene Essex, MD;  Location: [facility] ENDOSCOPY;  Service: Endoscopy;  Laterality: N/A;  . Balloon dilation N/A 06/29/2015    Procedure: BALLOON DILATION;  Surgeon: Clarene Essex, MD;  Location: [facility] ENDOSCOPY;  Service: Endoscopy;  Laterality: N/A;  . Esophagogastroduodenoscopy (egd) with propofol N/A 07/17/2015    Procedure: ESOPHAGOGASTRODUODENOSCOPY (EGD) WITH PROPOFOL;  Surgeon: Clarene Essex, MD;  Location: [facility] ENDOSCOPY;  Service: Endoscopy;  Laterality: N/A;  . Balloon dilation N/A 07/17/2015    Procedure: BALLOON DILATION;  Surgeon: Clarene Essex, MD;  Location: [facility] ENDOSCOPY;  Service: Endoscopy;  Laterality: N/A;  . Esophageal stent placement N/A 07/17/2015    Procedure: ESOPHAGEAL STENT PLACEMENT;  Surgeon: Clarene Essex, MD;  Location: [facility] ENDOSCOPY;  Service: Endoscopy;  Laterality: N/A;  . Esophagogastroduodenoscopy N/A 08/24/2015    Procedure: ESOPHAGOGASTRODUODENOSCOPY (EGD);  Surgeon: Clarene Essex, MD;  Location: [facility] ENDOSCOPY;  Service: Endoscopy;  Laterality: N/A;    Prior to Admission medications   Medication Sig Start Date End Date Taking? Authorizing Provider  buPROPion (WELLBUTRIN XL) 150 MG 24 hr tablet Take 300 mg by mouth daily.   Yes Historical Provider, MD  cholecalciferol (VITAMIN D) 1000 UNITS tablet Take 1,000 Units by mouth daily. Reported on 09/26/2015   Yes Historical Provider, MD  clidinium-chlordiazePOXIDE (LIBRAX) 5-2.5 MG capsule Take 1 capsule by mouth 4 (four) times daily -  before meals and at bedtime. 08/01/15  Yes Historical Provider, MD  clindamycin (CLEOCIN) 300 MG capsule Take 300 mg by mouth 3 (three) times daily.   Yes Historical Provider, MD  diphenhydrAMINE (BENADRYL) 12.5 MG chewable tablet Chew 12.5 mg by mouth 4 (four) times daily as needed for allergies.   Yes Historical Provider, MD   esomeprazole (NEXIUM) 20 MG capsule Take 20 mg by mouth daily.   Yes Historical Provider, MD  Multiple Vitamins-Minerals (MULTIVITAMIN PO) Take 1 tablet by mouth daily.    Yes Historical Provider, MD  saccharomyces boulardii (FLORASTOR) 250 MG capsule Take 250 mg by mouth 2 (two) times daily.   Yes Historical Provider, MD  traMADol (ULTRAM) 50 MG tablet TAKE 1 TABLET BY MOUTH THREE TIMES DAILY AS NEEDED 01/25/15  Yes Robyn Haber, MD  vitamin B-12 (CYANOCOBALAMIN) 1000 MCG tablet Take 1,000 mcg by mouth daily.   Yes Historical Provider, MD  loratadine (CLARITIN) 10 MG tablet Take 10 mg by mouth daily as needed for allergies or rhinitis.     Historical Provider, MD  oxycodone (OXY-IR) 5 MG capsule Take 5 mg by mouth every 6 (six) hours as needed.    Historical Provider, MD  protein supplement (UNJURY CHICKEN SOUP) POWD Take 7 g (2 oz total) by mouth daily. Patient not taking: Reported on 09/11/2015 08/20/15   Charlynne Cousins, MD  Tetrahydrozoline HCl (VISINE OP) Place 1-2 drops into both eyes as needed (for dry eyes). Reported on 09/21/2015    Historical Provider, MD    Scheduled Meds: . antiseptic oral rinse  7 mL Mouth Rinse BID  . buPROPion  300 mg Oral Daily  . Chlorhexidine Gluconate Cloth  6 each Topical Q0600  . feeding supplement  1 Container Oral BID BM  . feeding supplement (ENSURE ENLIVE)  237 mL Oral BID BM  . multivitamin with minerals  1 tablet Oral Daily  . pantoprazole  40 mg Oral Daily  . vancomycin  500 mg Intravenous Q24H  . vitamin B-12  1,000 mcg Oral Daily   Continuous Infusions: . dextrose 5 % and 0.9 % NaCl with KCl 20 mEq/L 50 mL/hr at 09/18/15 0937   PRN Meds:.acetaminophen **OR** acetaminophen, benzocaine, diphenhydrAMINE, loratadine, morphine injection, ondansetron **OR** ondansetron (ZOFRAN) IV, oxyCODONE, promethazine, senna-docusate, traMADol  Allergies as of 09/06/2015 - Review Complete 09/22/2015  Allergen Reaction Noted  . Carafate [sucralfate]  Other (See Comments) 09/13/2015  . Ciprocinonide [fluocinolone] Hives and Rash 08/26/2012  . Ciprofloxacin Other (See Comments) 12/20/2014  . Dust mite extract Itching 12/20/2014  . Iohexol Hives 08/27/2009  . Nsaids Other (See Comments) 11/17/2012    Family History  Problem Relation Age of Onset  . Cancer Father   . Hypertension Father   . Lung cancer Father     Social History   Social History  . Marital Status: Married    Spouse Name: N/A  . Number of Children: N/A  . Years of Education: N/A   Occupational History  . Not on file.   Social History Main Topics  . Smoking status: Former Smoker -- 1.00 packs/day for 30 years    Types: Cigarettes    Quit date: 07/26/2015  . Smokeless tobacco: Never Used  . Alcohol Use: No  . Drug Use: No  . Sexual Activity: No   Other Topics Concern  . Not on file   Social History Narrative    Review  of Systems: All negative except as stated above in HPI.  Physical Exam: Vital signs: Filed Vitals:   09/18/15 0544 09/18/15 1354  BP: 125/92 127/89  Pulse: 99 109  Temp: 98 F (36.7 C) 99.1 F (37.3 C)  Resp: 20 20   Last BM Date: 09/16/15 General:  Cachetic, lethargic, no acute distress HE: atraumatic, anicteric sclera, temporal wasting ENT: poor dentition Neck: supple, nontender Lungs:  Clear throughout to auscultation.   No wheezes, crackles, or rhonchi. No acute distress. Heart:  Regular rate and rhythm; no murmurs, clicks, rubs,  or gallops. Abdomen: diffusely tender (R > L) with guarding, flat, nondistended, +BS  Rectal:  Deferred Ext: no edema Skin: no rash  GI:  Lab Results:  Recent Labs  09/16/15 0601 09/17/15 0622  WBC 8.1 6.3  HGB 8.5* 8.4*  HCT 26.2* 26.4*  PLT 322 339   BMET  Recent Labs  09/16/15 0601 09/16/15 1840 09/17/15 0622  NA 141  --  142  K 2.8* 3.2* 3.1*  CL 108  --  112*  CO2 26  --  25  GLUCOSE 77  --  70  BUN 24*  --  20  CREATININE 1.45*  --  1.46*  CALCIUM 8.1*  --  8.1*    LFT No results for input(s): PROT, ALBUMIN, AST, ALT, ALKPHOS, BILITOT, BILIDIR, IBILI in the last 72 hours. PT/INR No results for input(s): LABPROT, INR in the last 72 hours.   Studies/Results: No results found.  Impression/Plan: Failure to thrive with gastric outlet stenosis and s/p recent gastric outlet stent that was removed last month following her pneumonia. Due to her altered anatomy and revision I do not think an endoscopic placement of a J-tube is possible or feasible. She is too malnourished to undergo surgical placement and recent bacteremia also complicates possible placement of a J-tube. Would recommend TNA/TPN at this time however not a good long-term option for her. Will discuss further with Dr. Watt Climes. Patient agreeable to TNA as is husband.    LOS: 6 days   [place] C.  09/18/2015, 6:01 PM  Pager [phone]  If no answer or after 5 PM call [phone]

## 2015-09-18 NOTE — Progress Notes (Signed)
Daily Progress Note   Patient Name: Autumn Turner       Date: 09/18/2015 DOB: 11-18-1960  Age: 55 y.o. MRN#: [id number] Attending Physician: Geradine Girt, DO Primary Care Physician: Robyn Haber, MD Admit Date: 09/10/2015  Reason for Consultation/Follow-up: Establishing goals of care  Subjective: Weak pale cachectic older than states age appearing lady, confused at times.   Interval Events: Overnight events noted, appreciate ID input and recommendations.  Discussed with Dr Eliseo Squires this am Call placed and discussed with husband Athea Heiliger:  He states patient is not eating, he is not sure a g or J tube can be placed, he does not think the patient would want that. He states he has discussed with Dr Eliseo Squires about her low albumin level of less than 1 gm/dl. Discussed how low serum albumin level is an independent predictor of mortality, discussed the patient's chronic severe protein-calorie malnutrition state.  Husband states patient is having nausea vomiting, she is now more confused and also has pain.  He states he is trying to put her "affairs in order", he is trying to find her living will.  He is not sure there is adequate home support to take the patient home with hospice support He wishes to continue with antibiotics, encourage PO, control/manage symptoms and then monitor how the patient does. Hence, we discussed snf rehab with palliative follow up on d/c, he is agreeable.   All questions answered to the best of my ability. Palliative provider will continue to follow up.   Length of Stay: 6 days  Current Medications: Scheduled Meds:  . antiseptic oral rinse  7 mL Mouth Rinse BID  . buPROPion  300 mg Oral Daily  . Chlorhexidine Gluconate Cloth  6 each Topical Q0600  . feeding  supplement  1 Container Oral BID BM  . feeding supplement (ENSURE ENLIVE)  237 mL Oral BID BM  . multivitamin with minerals  1 tablet Oral Daily  . pantoprazole  40 mg Oral Daily  . vancomycin  500 mg Intravenous Q24H  . vitamin B-12  1,000 mcg Oral Daily    Continuous Infusions: . dextrose 5 % and 0.9 % NaCl with KCl 20 mEq/L 50 mL/hr at 09/18/15 0937    PRN Meds: acetaminophen **OR** acetaminophen, benzocaine, diphenhydrAMINE, loratadine, morphine injection, ondansetron **OR** ondansetron (ZOFRAN) IV,  oxyCODONE, promethazine, traMADol  Physical Exam: Physical Exam             Weak frail lady s1 s2 Lungs diminished No edema Cachexia  Vital Signs: BP 125/92 mmHg  Pulse 99  Temp(Src) 98 F (36.7 C) (Oral)  Resp 20  Ht 5\' 4"  (1.626 m)  Wt 47.945 kg (105 lb 11.2 oz)  BMI 18.13 kg/m2  SpO2 99% SpO2: SpO2: 99 % O2 Device: O2 Device: Nasal Cannula O2 Flow Rate: O2 Flow Rate (L/min): 2 L/min  Intake/output summary:  Intake/Output Summary (Last 24 hours) at 09/18/15 0956 Last data filed at 09/18/15 [id number]  Gross per 24 hour  Intake   4925 ml  Output   1500 ml  Net   3425 ml   LBM: Last BM Date: 09/16/15 Baseline Weight: Weight: 47.945 kg (105 lb 11.2 oz) Most recent weight: Weight: 47.945 kg (105 lb 11.2 oz)       Palliative Assessment/Data: Flowsheet Rows        Most Recent Value   Intake Tab    Referral Department  Hospitalist   Unit at Time of Referral  Med/Surg Unit   Palliative Care Primary Diagnosis  Other (Comment)   Date Notified  09/15/15   Palliative Care Type  Return patient Palliative Care   Reason for referral  Non-pain Symptom, Pain, Clarify Goals of Care, End of Life Care Assistance   Date of Admission  09/19/2015   Date first seen by Palliative Care  09/16/15   # of days Palliative referral response time  1 Day(s)   # of days IP prior to Palliative referral  3   Clinical Assessment    Palliative Performance Scale Score  20%   Pain Max last 24 hours   4   Pain Min Last 24 hours  3   Dyspnea Max Last 24 Hours  4   Dyspnea Min Last 24 hours  3   Nausea Max Last 24 Hours  4   Nausea Min Last 24 Hours  3   Psychosocial & Spiritual Assessment    Palliative Care Outcomes    Patient/Family meeting held?  Yes   Who was at the meeting?  husband over the phone   Palliative Care Outcomes  Improved pain interventions, Clarified goals of care, Counseled regarding hospice   Palliative Care follow-up planned  Yes, Facility      Additional Data Reviewed: CBC    Component Value Date/Time   WBC 6.3 09/17/2015 0622   WBC 4.7 01/07/2015 1417   RBC 3.14* 09/17/2015 0622   RBC 3.59* 01/07/2015 1417   HGB 8.4* 09/17/2015 0622   HGB 9.0* 01/07/2015 1417   HCT 26.4* 09/17/2015 0622   HCT 29.6* 01/07/2015 1417   PLT 339 09/17/2015 0622   MCV 84.1 09/17/2015 0622   MCV 82.4 01/07/2015 1417   MCH 26.8 09/17/2015 0622   MCH 25.0* 01/07/2015 1417   MCHC 31.8 09/17/2015 0622   MCHC 30.4* 01/07/2015 1417   RDW 16.8* 09/17/2015 0622   LYMPHSABS 0.9 09/13/2015 0308   MONOABS 0.6 09/13/2015 0308   EOSABS 0.0 09/13/2015 0308   BASOSABS 0.0 09/13/2015 0308    CMP     Component Value Date/Time   NA 142 09/17/2015 0622   K 3.1* 09/17/2015 0622   CL 112* 09/17/2015 0622   CO2 25 09/17/2015 0622   GLUCOSE 70 09/17/2015 0622   BUN 20 09/17/2015 0622   CREATININE 1.46* 09/17/2015 0622   CREATININE 0.55 01/07/2015 1406  CALCIUM 8.1* 09/17/2015 0622   PROT 4.6* 09/15/2015 0605   ALBUMIN <1.0* 09/15/2015 0605   AST 21 09/15/2015 0605   ALT 13* 09/15/2015 0605   ALKPHOS 287* 09/15/2015 0605   BILITOT 1.0 09/15/2015 0605   GFRNONAA 39* 09/17/2015 0622   GFRAA 45* 09/17/2015 0622       Problem List:  Patient Active Problem List   Diagnosis Date Noted  . Encounter for palliative care   . Jaw pain   . Goals of care, counseling/discussion   . Staphylococcus aureus bacteremia 09/14/2015  . Dehydration   . 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 Acute parotitis 09/13/2015  . ARF (acute renal failure) ([place]) 09/13/2015  . Failure to thrive in adult   . Bulimia nervosa 09/07/2015  . AKI (acute kidney injury) ([place]) 09/06/2015  . Sialadenitis 09/11/2015  . Hypoalbuminemia 09/11/2015  . Weakness 09/10/2015  . 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 Self induced vomiting 08/24/2015  . Narcotic drug use 08/24/2015  . Edema 08/23/2015  . Non compliance with medical treatment 08/22/2015  . Cognitive impairment 08/22/2015  . Depression 08/21/2015  . GERD (gastroesophageal reflux disease) 08/21/2015  . Vitamin D deficiency 08/21/2015  . Pressure ulcer 08/20/2015  . Protein-calorie malnutrition, severe 08/17/2015  . Pneumonia of right lung due to methicillin resistant Staphylococcus aureus (MRSA) ([place]) 08/17/2015  . Acute respiratory failure with hypoxia ([place]) 08/14/2015  . Necrotizing pneumonia ([place]) 08/14/2015  . Microcytic anemia 08/14/2015  . Sepsis ([place]) 08/14/2015  . Pleural effusion   . HCAP (healthcare-associated pneumonia) 08/13/2015  . Hypokalemia 08/13/2015  . Anemia 08/13/2015  . Abdominal pain 08/13/2015  . 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 Cellulitis of groin, right 12/16/2012  . Vulvar cancer ([place]) 11/18/2012     Palliative Care Assessment & Plan    1.Code Status:  DNR    Code Status Orders        Start     Ordered   09/16/15 1121  Do not attempt resuscitation (DNR)   Continuous    Question Answer Comment  In the event of cardiac or respiratory ARREST Do not call a "code blue"   In the event of cardiac or respiratory ARREST Do not perform Intubation, CPR, defibrillation or ACLS   In the event of cardiac or respiratory ARREST Use medication by any route, position, wound care, and other measures to relive pain and suffering. May use oxygen, suction and manual treatment of airway obstruction as needed for comfort.      09/16/15 1120    Code Status History    Date Active Date Inactive  Code Status Order ID Comments User Context   09/13/2015  2:35 AM 09/16/2015 11:20 AM Full Code [id number]  Rise Patience, MD ED   08/13/2015  8:50 PM 08/20/2015  9:56 PM Full Code [id number]  Reubin Milan, MD ED   12/07/2012  4:35 PM 12/08/2012  4:33 PM Full Code [id number]  Lahoma Crocker, MD Inpatient       2. Goals of Care/Additional Recommendations:     Limitations on Scope of Treatment: dnr dni  Desire for further Chaplaincy support:no  Psycho-social Needs: Caregiving  Support/Resources  3. Symptom Management:      1. Morphine and oxy ir prn pain  2. zofran prn nausea   3. Add bowel regimen.   4. Palliative Prophylaxis:   Bowel Regimen  5. Prognosis: < 3 months  6. Discharge Planning:  [place] for rehab with Palliative care service follow-up  Care plan was discussed with  Patient's husband Analleli Coven over the phone, Dr Linus Salmons on 1-16, Dr Eliseo Squires this am.   Thank you for allowing the Palliative Medicine Team to assist in the care of this patient.   Time In: 0930 Time Out: 0955 Total Time 25 Prolonged Time Billed  no        [id number] Loistine Chance, MD  09/18/2015, 9:56 AM  Please contact Palliative Medicine Team phone at [phone] for questions and concerns.

## 2015-09-19 ENCOUNTER — Inpatient Hospital Stay (HOSPITAL_COMMUNITY): Payer: Medicare Other

## 2015-09-19 DIAGNOSIS — N179 Acute kidney failure, unspecified: Secondary | ICD-10-CM

## 2015-09-19 LAB — COMPREHENSIVE METABOLIC PANEL
ALK PHOS: 252 U/L — AB (ref 38–126)
ALT: 12 U/L — ABNORMAL LOW (ref 14–54)
ANION GAP: 7 (ref 5–15)
AST: 16 U/L (ref 15–41)
Albumin: <1 g/dL — ABNORMAL LOW (ref 3.5–5.0)
BILIRUBIN TOTAL: 0.2 mg/dL — AB (ref 0.3–1.2)
BUN: 17 mg/dL (ref 6–20)
CALCIUM: 8.2 mg/dL — AB (ref 8.9–10.3)
CO2: 22 mmol/L (ref 22–32)
Chloride: 113 mmol/L — ABNORMAL HIGH (ref 101–111)
Creatinine, Ser: 1.39 mg/dL — ABNORMAL HIGH (ref 0.44–1.00)
GFR, EST AFRICAN AMERICAN: 48 mL/min — AB (ref >60)
GFR, EST NON AFRICAN AMERICAN: 41 mL/min — AB (ref >60)
GLUCOSE: 113 mg/dL — AB (ref 65–99)
POTASSIUM: 3.6 mmol/L (ref 3.5–5.1)
Sodium: 142 mmol/L (ref 135–145)
TOTAL PROTEIN: 5 g/dL — AB (ref 6.5–8.1)

## 2015-09-19 LAB — BLOOD GAS, ARTERIAL
ACID-BASE DEFICIT: 4.4 mmol/L — AB (ref 0.0–2.0)
Bicarbonate: 19.6 mEq/L — ABNORMAL LOW (ref 20.0–24.0)
Drawn by: 449561
FIO2: 1
O2 SAT: 88.2 %
PATIENT TEMPERATURE: 98.6
PCO2 ART: 32.6 mmHg — AB (ref 35.0–45.0)
TCO2: 20.6 mmol/L (ref 0–100)
pH, Arterial: 7.397 (ref 7.350–7.450)
pO2, Arterial: 61 mmHg — ABNORMAL LOW (ref 80.0–100.0)

## 2015-09-19 LAB — CULTURE, BLOOD (ROUTINE X 2)
CULTURE: NO GROWTH
Culture: NO GROWTH

## 2015-09-19 LAB — CBC
HCT: 30.6 % — ABNORMAL LOW (ref 36.0–46.0)
Hemoglobin: 9.5 g/dL — ABNORMAL LOW (ref 12.0–15.0)
MCH: 26.5 pg (ref 26.0–34.0)
MCHC: 31 g/dL (ref 30.0–36.0)
MCV: 85.5 fL (ref 78.0–100.0)
PLATELETS: 333 10*3/uL (ref 150–400)
RBC: 3.58 MIL/uL — AB (ref 3.87–5.11)
RDW: 17.2 % — ABNORMAL HIGH (ref 11.5–15.5)
WBC: 14.9 10*3/uL — AB (ref 4.0–10.5)

## 2015-09-19 LAB — PHOSPHORUS: Phosphorus: 3.2 mg/dL (ref 2.5–4.6)

## 2015-09-19 LAB — MAGNESIUM: Magnesium: 1.9 mg/dL (ref 1.7–2.4)

## 2015-09-19 MED ORDER — LEVALBUTEROL HCL 0.63 MG/3ML IN NEBU: 0.6300 mg | INHALATION_SOLUTION | Freq: Four times a day (QID) | RESPIRATORY_TRACT | Status: DC | PRN

## 2015-09-19 MED ORDER — LORAZEPAM 2 MG/ML IJ SOLN
0.5000 mg | Freq: Once | INTRAMUSCULAR | Status: AC
  Administered 2015-09-19: 0.5 mg via INTRAVENOUS

## 2015-09-19 MED ORDER — TRACE MINERALS CR-CU-MN-SE-ZN 10-1000-500-60 MCG/ML IV SOLN
INTRAVENOUS | Status: DC
  Administered 2015-09-19: 18:00:00 via INTRAVENOUS
  Filled 2015-09-19: qty 480

## 2015-09-19 MED ORDER — LORAZEPAM 2 MG/ML IJ SOLN
0.5000 mg | Freq: Three times a day (TID) | INTRAMUSCULAR | Status: DC | PRN
  Administered 2015-09-19 – 2015-09-20 (×2): 0.5 mg via INTRAVENOUS
  Filled 2015-09-19 (×2): qty 1

## 2015-09-19 MED ORDER — INSULIN ASPART 100 UNIT/ML ~~LOC~~ SOLN: 0.0000 [IU] | Freq: Four times a day (QID) | SUBCUTANEOUS | Status: DC

## 2015-09-19 MED ORDER — PIPERACILLIN-TAZOBACTAM 3.375 G IVPB
3.3750 g | Freq: Three times a day (TID) | INTRAVENOUS | Status: DC
  Administered 2015-09-20 (×2): 3.375 g via INTRAVENOUS
  Filled 2015-09-19 (×4): qty 50

## 2015-09-19 MED ORDER — TRAMADOL HCL 50 MG PO TABS: 50.0000 mg | ORAL_TABLET | Freq: Four times a day (QID) | ORAL | Status: DC | PRN

## 2015-09-19 MED ORDER — PIPERACILLIN-TAZOBACTAM 3.375 G IVPB 30 MIN
3.3750 g | Freq: Once | INTRAVENOUS | Status: AC
  Administered 2015-09-19: 3.375 g via INTRAVENOUS
  Filled 2015-09-19: qty 50

## 2015-09-19 MED ORDER — LORAZEPAM 2 MG/ML IJ SOLN
INTRAMUSCULAR | Status: AC
  Filled 2015-09-19: qty 1

## 2015-09-19 MED ORDER — KCL IN DEXTROSE-NACL 20-5-0.9 MEQ/L-%-% IV SOLN
INTRAVENOUS | Status: DC
  Administered 2015-09-19: 400 mL via INTRAVENOUS
  Administered 2015-09-20: 10:00:00 via INTRAVENOUS
  Filled 2015-09-19: qty 1000

## 2015-09-19 MED ORDER — SODIUM CHLORIDE 0.9 % IJ SOLN
10.0000 mL | INTRAMUSCULAR | Status: DC | PRN
  Administered 2015-09-19: 10 mL
  Filled 2015-09-19: qty 40

## 2015-09-19 MED ORDER — FAT EMULSION 20 % IV EMUL
72.0000 mL | INTRAVENOUS | Status: DC
  Administered 2015-09-19: 72 mL via INTRAVENOUS
  Filled 2015-09-19: qty 100

## 2015-09-19 MED ORDER — FUROSEMIDE 10 MG/ML IJ SOLN
60.0000 mg | Freq: Once | INTRAMUSCULAR | Status: AC
  Administered 2015-09-19: 60 mg via INTRAVENOUS
  Filled 2015-09-19: qty 6

## 2015-09-19 NOTE — Progress Notes (Signed)
Daily Progress Note   Patient Name: Autumn Turner       Date: 09/19/2015 DOB: 19-Sep-1960  Age: 55 y.o. MRN#: [id number] Attending Physician: Reyne Dumas, MD Primary Care Physician: Robyn Haber, MD Admit Date: 09/24/2015  Reason for Consultation/Follow-up: Establishing goals of care  Subjective: Overnight events noted. PICC line placed this morning. Appreciate GI input.  Recommendation for TNA/TPN with understanding by family that this is not a good long-term option if she remains unable to regain ability to take parenteral nutrition.  I called and discussed again with her husband, Scott:  GI has seen patient and determined that she is not a good candidate for J tube.  Recommendation was for trial of TPN if Dr. Watt Climes agrees this would be appropriate.  We discussed again her acute decline since being of December with recent infections. He is hopeful that continued treatment with abx will allow her to regain the ability to maintain her nutrition parenterally.  He understands that it is very possible that this will not be the case.  We also discussed that she has had decrease in her nutrition, functional status, in cognition over the past several months even prior to this acute sickness. We talked about her low albumin level (less than 1 gm/dl). Discussed how low serum albumin level is an independent predictor of mortality, discussed the patient's chronic severe protein-calorie malnutrition state.   Her husband reports understanding that she is likely coming to the end of her life.  He is in agreement that if she is not benefiting from TPN and antibiotics that this is not something that she would want to continue indefinitealy.     He wishes to continue with antibiotics, pursue time limited trial of  TPN (GI has been clear with husband that this is not a long-term option), encourage PO, control/manage symptoms and then see how the she does.  She will benefit from palliative care f/u at SNF once her discharge is determined.  Length of Stay: 7 days  Current Medications: Scheduled Meds:  . antiseptic oral rinse  7 mL Mouth Rinse BID  . buPROPion  300 mg Oral Daily  . feeding supplement  1 Container Oral BID BM  . feeding supplement (ENSURE ENLIVE)  237 mL Oral BID BM  . LORazepam      . LORazepam  0.5 mg Oral Once  . multivitamin with minerals  1 tablet Oral Daily  . pantoprazole  40 mg Oral Daily  . vancomycin  500 mg Intravenous Q24H  . vitamin B-12  1,000 mcg Oral Daily    Continuous Infusions: . dextrose 5 % and 0.9 % NaCl with KCl 20 mEq/L 50 mL/hr at 09/18/15 0937    PRN Meds: acetaminophen **OR** acetaminophen, benzocaine, diphenhydrAMINE, loratadine, morphine injection, ondansetron **OR** ondansetron (ZOFRAN) IV, oxyCODONE, promethazine, senna-docusate, sodium chloride, traMADol  Physical Exam: Physical Exam             Weak frail lady s1 s2 Lungs diminished No edema Cachexia  Vital Signs: BP 120/91 mmHg  Pulse 102  Temp(Src) 97.5 F (36.4 C) (Oral)  Resp 19  Ht 5\' 4"  (1.626 m)  Wt 47.945 kg (105 lb 11.2 oz)  BMI 18.13 kg/m2  SpO2 97% SpO2: SpO2: 97 % O2 Device: O2 Device: Nasal Cannula O2 Flow Rate: O2 Flow Rate (L/min): 2 L/min  Intake/output summary:   Intake/Output Summary (Last 24 hours) at 09/19/15 1020 Last data filed at 09/19/15 0730  Gross per 24 hour  Intake 751.67 ml  Output    850 ml  Net -98.33 ml   LBM: Last BM Date: 09/15/15 Baseline Weight: Weight: 47.945 kg (105 lb 11.2 oz) Most recent weight: Weight: 47.945 kg (105 lb 11.2 oz)       Palliative Assessment/Data: Flowsheet Rows        Most Recent Value   Intake Tab    Referral Department  Hospitalist   Unit at Time of Referral  Med/Surg Unit   Palliative Care Primary Diagnosis   Other (Comment)   Date Notified  09/15/15   Palliative Care Type  Return patient Palliative Care   Reason for referral  Non-pain Symptom, Pain, Clarify Goals of Care, End of Life Care Assistance   Date of Admission  09/16/2015   Date first seen by Palliative Care  09/16/15   # of days Palliative referral response time  1 Day(s)   # of days IP prior to Palliative referral  3   Clinical Assessment    Palliative Performance Scale Score  20%   Pain Max last 24 hours  4   Pain Min Last 24 hours  3   Dyspnea Max Last 24 Hours  4   Dyspnea Min Last 24 hours  3   Nausea Max Last 24 Hours  4   Nausea Min Last 24 Hours  3   Psychosocial & Spiritual Assessment    Palliative Care Outcomes    Patient/Family meeting held?  Yes   Who was at the meeting?  husband over the phone   Palliative Care Outcomes  Improved pain interventions, Clarified goals of care, Counseled regarding hospice   Palliative Care follow-up planned  Yes, Facility      Additional Data Reviewed: CBC    Component Value Date/Time   WBC 14.9* 09/19/2015 0910   WBC 4.7 01/07/2015 1417   RBC 3.58* 09/19/2015 0910   RBC 3.59* 01/07/2015 1417   HGB 9.5* 09/19/2015 0910   HGB 9.0* 01/07/2015 1417   HCT 30.6* 09/19/2015 0910   HCT 29.6* 01/07/2015 1417   PLT 333 09/19/2015 0910   MCV 85.5 09/19/2015 0910   MCV 82.4 01/07/2015 1417   MCH 26.5 09/19/2015 0910   MCH 25.0* 01/07/2015 1417   MCHC 31.0 09/19/2015 0910   MCHC 30.4* 01/07/2015 1417   RDW 17.2* 09/19/2015 0910   LYMPHSABS  0.9 09/13/2015 0308   MONOABS 0.6 09/13/2015 0308   EOSABS 0.0 09/13/2015 0308   BASOSABS 0.0 09/13/2015 0308    CMP     Component Value Date/Time   NA 142 09/19/2015 0910   K 3.6 09/19/2015 0910   CL 113* 09/19/2015 0910   CO2 22 09/19/2015 0910   GLUCOSE 113* 09/19/2015 0910   BUN 17 09/19/2015 0910   CREATININE 1.39* 09/19/2015 0910   CREATININE 0.55 01/07/2015 1406   CALCIUM 8.2* 09/19/2015 0910   PROT 5.0* 09/19/2015 0910    ALBUMIN <1.0* 09/19/2015 0910   AST 16 09/19/2015 0910   ALT 12* 09/19/2015 0910   ALKPHOS 252* 09/19/2015 0910   BILITOT 0.2* 09/19/2015 0910   GFRNONAA 41* 09/19/2015 0910   GFRAA 48* 09/19/2015 0910       Problem List:  Patient Active Problem List   Diagnosis Date Noted  . Encounter for palliative care   . Jaw pain   . Goals of care, counseling/discussion   . Staphylococcus aureus bacteremia 09/14/2015  . Dehydration   . 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 Acute parotitis 09/13/2015  . ARF (acute renal failure) ([place]) 09/13/2015  . Failure to thrive in adult   . Bulimia nervosa 09/11/2015  . AKI (acute kidney injury) ([place]) 09/15/2015  . Sialadenitis 09/11/2015  . Hypoalbuminemia 09/11/2015  . Weakness 09/10/2015  . 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 Self induced vomiting 08/24/2015  . Narcotic drug use 08/24/2015  . Edema 08/23/2015  . Non compliance with medical treatment 08/22/2015  . Cognitive impairment 08/22/2015  . Depression 08/21/2015  . GERD (gastroesophageal reflux disease) 08/21/2015  . Vitamin D deficiency 08/21/2015  . Pressure ulcer 08/20/2015  . Protein-calorie malnutrition, severe 08/17/2015  . Pneumonia of right lung due to methicillin resistant Staphylococcus aureus (MRSA) ([place]) 08/17/2015  . Acute respiratory failure with hypoxia ([place]) 08/14/2015  . Necrotizing pneumonia ([place]) 08/14/2015  . Microcytic anemia 08/14/2015  . Sepsis ([place]) 08/14/2015  . Pleural effusion   . HCAP (healthcare-associated pneumonia) 08/13/2015  . Hypokalemia 08/13/2015  . Anemia 08/13/2015  . Abdominal pain 08/13/2015  . 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 Cellulitis of groin, right 12/16/2012  . Vulvar cancer ([place]) 11/18/2012     Palliative Care Assessment & Plan    1.Code Status:  DNR    Code Status Orders        Start     Ordered   09/16/15 1121  Do not attempt resuscitation (DNR)   Continuous    Question Answer Comment  In the  event of cardiac or respiratory ARREST Do not call a "code blue"   In the event of cardiac or respiratory ARREST Do not perform Intubation, CPR, defibrillation or ACLS   In the event of cardiac or respiratory ARREST Use medication by any route, position, wound care, and other measures to relive pain and suffering. May use oxygen, suction and manual treatment of airway obstruction as needed for comfort.      09/16/15 1120    Code Status History    Date Active Date Inactive Code Status Order ID Comments User Context   09/13/2015  2:35 AM 09/16/2015 11:20 AM Full Code [id number]  Rise Patience, MD ED   08/13/2015  8:50 PM 08/20/2015  9:56 PM Full Code [id number]  Reubin Milan, MD ED   12/07/2012  4:35 PM 12/08/2012  4:33 PM Full Code [id number]  Lahoma Crocker, MD Inpatient       2. Goals of Care/Additional Recommendations:  Limitations on Scope of Treatment: dnr dni  Desire for further Chaplaincy support:no  Psycho-social Needs: Caregiving  Support/Resources  3. Symptom Management:      1. Morphine and oxy ir prn pain  2. zofran prn nausea   3. Add bowel regimen.   4. Palliative Prophylaxis:   Bowel Regimen  5. Prognosis: < 3 months.    6. Discharge Planning:  [facility] for rehab with Palliative care service follow-up   Care plan was discussed with  Patient's husband Royal Ladino via phone, Social work   Thank you for allowing the Palliative Medicine Team to assist in the care of this patient.   Time In: 0930 Time Out: 1010 Total Time 40 Prolonged Time Billed  no        [id number] Micheline Rough, MD  09/19/2015, 10:20 AM  Please contact Palliative Medicine Team phone at [phone] for questions and concerns.

## 2015-09-19 NOTE — Progress Notes (Addendum)
Shift event: Paged by Lonn Georgia, RN because pt was desatting into the 34s. 5L O2 per [place] applied and O2 sat up to 80s. No increased WOB noted. Ordered ABG, Xopenex nebs, CXR, and NRB.  ABG showed normal pH, low PCO2 and slightly low PO2, but NRB had not been placed when drawn. CXR showed small pleural effusion with worsening PNA, multilobar.  Plan: 1. Hypoxia-secondary to PNA, possibly worsening, and pleural effusion. Lasix 60mg  IV now. Continue NRB. Add Zosyn back to Vanc. RN to call this NP in 2 hours with update or sooner if pt worse.  2. Goals of care-palliative has seen. Husband expressed no long term abx or TPN. Possible discharge to Hospice. DNR.  3. MRSA bacteremia-on Vanc.  Will follow. Clance Boll, NP [facility] Update: RN called back with update. Pt still on NRB. R/p ABG showed continued low PO2. NP to bedside.  Pt is lethargic and appears acutely ill.  Relayed results of CXR to husband and explained ABG and that PO2 is still low and next step would be bipap. After discussion, husband declines stating "she would just fight it" and he doesn't want her to be uncomfortable. Ordered additional dose of Lasix. Continue NRB.  KJKG, NP [facility]

## 2015-09-19 NOTE — Progress Notes (Signed)
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 08/2015 pt was hospitalized with PNA and MRSA. Lack of nurtrition and vomiting are chronic issues for this patient. Pt presents this admission with acute L parotitis.    PT Comments    Pt transferred bed to chair, squat pivot transfer with max A, +2 for safety. Pt progressing very slowly due to anxiety and fear of falling. Emotional support given. PT will continue to follow.   Follow Up Recommendations  SNF;Supervision/Assistance - 24 hour     Equipment Recommendations  None recommended by PT    Recommendations for Other Services       Precautions / Restrictions Precautions Precautions: Fall Precaution Comments: Pt very frail and weak Restrictions Weight Bearing Restrictions: No    Mobility  Bed Mobility Overal bed mobility: Needs Assistance;+2 for physical assistance Bed Mobility: Rolling;Supine to Sit Rolling: +2 for physical assistance;Mod assist   Supine to sit: Mod assist;+2 for physical assistance     General bed mobility comments: pt needing mod A +2 for getting fully onto side as well as elevating trunk from bed into sitting position. Pt very anxious as well as sore, so +2 assist needed partially to keep her comfortable during transfer  Transfers Overall transfer level: Needs assistance Equipment used: None Transfers: Squat Pivot Transfers     Squat pivot transfers: +2 safety/equipment;Max assist     General transfer comment: attempted SPT with bilateral HHA but pt was too anxious and she felt that she was too weak. Therefore, PT directly in front of pt with +2 assist for safety to side. Max A for power up and turn to chair.   Ambulation/Gait                  Stairs            Wheelchair Mobility    Modified Rankin (Stroke Patients Only)       Balance Overall balance assessment: Needs assistance Sitting-balance support: Feet supported;Bilateral upper extremity supported Sitting balance-Leahy Scale: Poor Sitting balance - Comments: min A to maintain sitting EOB Postural control: Posterior lean Standing balance support: Bilateral upper extremity supported Standing balance-Leahy Scale: Zero                      Cognition Arousal/Alertness: Awake/alert Behavior During Therapy: Anxious Overall Cognitive Status: Within Functional Limits for tasks assessed                      Exercises      General Comments General comments (skin integrity, edema, etc.): emotional support given throughout due to pt anxiety      Pertinent Vitals/Pain Pain Assessment: Faces Faces Pain Scale: Hurts even more Pain Location: achy all over Pain Descriptors / Indicators: Aching Pain Intervention(s): Limited activity within patient's tolerance;Monitored during session    Home Living Family/patient expects to be discharged to:: Skilled nursing facility Living Arrangements: Spouse/significant other             Additional Comments: prior to [employer], pt lived at home with spouse and his brother with Downs syndrome. She was independent at that time. Since being admitted in December with PNA, she has been at [facility]. She has not been  happy with the rehab there and feels that they are making her move even when she is terrified.     Prior Function Level of Independence: Needs assistance  Gait / Transfers Assistance Needed: she reports that she was transferring with therapy and reports that she was ambulating some with a RW "when they could find one" ADL's / Homemaking Assistance Needed: was needing assistance at SNF     PT Goals (current goals can now be found in the care plan section) Acute Rehab PT Goals Patient Stated  Goal: Feel better PT Goal Formulation: With patient/family Time For Goal Achievement: 09/30/15 Potential to Achieve Goals: Fair Progress towards PT goals: Progressing toward goals    Frequency  Min 3X/week    PT Plan Current plan remains appropriate    Co-evaluation             End of Session Equipment Utilized During Treatment: Gait belt;Oxygen Activity Tolerance: Treatment limited secondary to agitation Patient left: in chair;with call bell/phone within reach     Time: 0953-1020 PT Time Calculation (min) (ACUTE ONLY): 27 min  Charges:  $Therapeutic Activity: 23-37 mins                    G Codes:     Leighton Roach, PT  Acute Rehab Services  [phone]  Leighton Roach 09/19/2015, 11:42 AM

## 2015-09-19 NOTE — Progress Notes (Addendum)
PARENTERAL NUTRITION CONSULT NOTE - INITIAL  Pharmacy Consult for TPN Indication: Intolerance to enteral feedings and malnourishment  Allergies  Allergen Reactions  . Carafate [Sucralfate] Other (See Comments)    unspecified  . Ciprocinonide [Fluocinolone] Hives and Rash  . Ciprofloxacin Other (See Comments)    Blisters, boils  . Dust Mite Extract Itching  . Iohexol Hives       . Nsaids Other (See Comments)    Due to Hx of gastric bypass    Patient Measurements: Height: _0  (162.6 cm) Weight: 105 lb 11.2 oz (47.945 kg) IBW/kg (Calculated) : 54.7  Vital Signs: Temp: 97.5 F (36.4 C) (01/18 0611) Temp Source: Oral (01/18 0611) BP: 120/91 mmHg (01/18 0611) Pulse Rate: 102 (01/18 7915)  Medical History: Past Medical History  Diagnosis Date  . Abnormal Pap smear   . Vulvar lesion   . CIN II (cervical intraepithelial neoplasia II)   . Malnourished ([place])   . Sleep apnea     Loss weight with gastric bypass  . Cancer ([place])     vulvar  . Anemia     blood transfusion  . Hypertension     no longer on medication  . Anxiety   . Depression   . History of stomach ulcers     Insulin Requirements in the past 24 hours:  None  Nutritional Goals: per RD recs on 1/13 Kcal: 1500-1700 Protein: 60-75 g Fluid: 1.5-1.7 L  Goal TPN is Clinimix E5/15 at 51m/hr Goal IVFE is 167mhr  This will provide 72g of protein and 1502 kcal  Current Nutrition:  D5NS _1 /hr (Provides ~200 kcal) Boost resource Breeze PO BID (Provides 18 g of protein and 500 kcal) Ensure Enlive PO BID (Provides 40 g of protein and 700 kcal) **Does not seem like patient is drinking Breeze and Enlive even though being charted**  Assessment: [age]o F recently admitted to the hospital for cavitary PNA and was following up with ID in outpatient clinic when found to be very weak so sent to the ED. Nutrition focused physical exam done last month on 12/15 which revealed severe fat and muscle depletion. Now  found to have MRSA bacteremia and on IV vancomycin for that. 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 Husband reports she has had poor PO intake since early December and has been losing weight. Not a good candidate for J tube placement due to GI anatomy. Pharmacy consulted to start TPN for malnourishment and intolerance to enteral feeds. GI has talked to family and they understand this is just a short term option. Has severe malnutrition and is underweight. Seems to be high risk for refeeding syndrome.  Surgeries/Procedures: Has had multiple GI surgeries in the past. EGD w/ fb removal on 12/23, otherwise no recent procedures  GI: Due to altered anatomy and revision, GI does not think an endoscopic placement of a J-tube is possible or feasible. Even though Breeze and Ensure is being charted there is a lot of doubt that she is actually drinking any of them. Will plan to start and titrate up TPN as if patient is not receiving any other nutrition. Will start low due to high risk of refeeding syndrome. Albumin is severely low at < 1.0. Last BM was 1/14. PPI, vitamin B12  Endo: AM glucose checks have all been < 150. Will start SSI with TPN  Lytes: wnl exc K borderline low at 3.6. Phos and Mg ok. CoCa 10.7  Renal: SCr elevated at 1.39, CrCL ~75m/min. UOP low at 0.3 ml/kg/hr today.  Pulm: 2L of [place]  Cards: BP ok and HR tachy  Hepatobil: LFTs ok, Alk Phos slightly elevated at 252. Tbili 0.2.  Neuro: bupropion, lorazepam  ID: Day #7 of abx for MRSA bacteremia. Afebrile, but WBC back up to 14.9. Last VT therapeutic.  Blood cx 1/11 > 1/2 MRSA Blood cx 1/13 > ngF  Best Practices: SCDs  TPN Access:  PICC 1/18 >>  TPN start date: 1/18 >>  Plan:  Breeze and Ensure has been charted as given for the most part but unsure if patient is really drinking any of it  Start Clinimix E5/15 at 266mhr (will start low and titrate slowly due to risk of refeeding)   Start IVFE at 81m15mr Decrease D5NS w/ 20 KCl to 61m91m when TPN started This provides 24g of protein and ~600 kcal which meets ~33% of protein needs and ~38% of kcal needs Continue MVI and trace elements in TPN Stop daily MVI as will have in TPN bag Start sensitive SSI Q6 Order TPN labs  Currently has Phos and Mg ordered daily per MD so will need to reorder biweekly Mg and Phos if discontinued  NathElenor QuinonesarmD, BCPS Clinical Pharmacist Pager [phone] 12:41 PM

## 2015-09-19 NOTE — Progress Notes (Addendum)
PROGRESS NOTE  Autumn Turner [id number] DOB: 11/08/1960 DOA: 09/11/2015 PCP: Robyn Haber, MD    Autumn Turner is a 55 y.o. female was recently admitted to the hospital for cavitary pneumonia had followed up with Dr. Johnnye Sima in his clinic yesterday when patient was found to be very weak and was referred to the ER. Labs revealed acute renal failure with anemia with hemoglobin at her baseline. In addition patient is found to have a new rash involving the extremities.   Patient was recently found to have increasing swelling of the left parotid over the last 2 days. The skin rash been coming off and on for last 1 week. Patient has history of gastric bypass and outlet obstruction and was admitted for nausea vomiting recently. During last admission patient's sputum grew MRSA and patient was placed on Zyvox and discharged on clindamycin. Patient during that stay also had thoracentesis done.  Found to have MRSA bacteremia.  Patient has been evaluated by Mady Gemma, MD, who recommended TPN for malnourishment, she's not a candidate for endoscopic placement of a J-tube given recent bacteremia. Patient understands that TPN is not a long-term option.  Assessment/Plan: MRSA bacteremia -IV vanc 6 weeks per ID, request pharmacy to calculate stop date -ID following, last note by Dr. Linus Salmons  on 1/16 -echo-- family does not want TEE -blood cultures repeated and NGTD  patient has a PICC line  Acute left parotitis, secondary to dehydration, continue vancomycin  Acute renal failure probably from poor oral intake and dehydration -  now on TPN, slowly improving, but just potassium, magnesium along with TPN   Skin rash: ? Related to MRSA bacteremia -improving on hands faster than legs  Left Parotid swellinig - ENT following  currently on vancomycin, Zosyn discontinued  Leukocytosis 6.3>14.9  Hypokalemia -repleted   -monitor Mg and phos for re-feeding syndrome  Anemia - 8.4>9.5 Transfuse for  <7  Recent admission for cavitary pneumonia with MRSA  Protein calorie malnutrition with history of gastric bypass - nutrition consult. -patient is going to try to eat better -does NOT want NG tube -GI doc has mentioned J tube in past- asked GI to speak with family - called in to [facility] on 1/17  Albumin < 1  Elevated alkaline phosphatase levels - cause not known. Closely observe.  pseudomonas in urine culture -<10,000 colonies Zosyn discontinued 1/12-1/15   appreciate palliative care  Code Status: DNR Family Communication: patient/husband/daughter/son at beside 1/17 Disposition Plan: SNF with palliative   Consultants:  ID  Palliative care  PT  Procedures:   Anti-infectives    Start     Dose/Rate Route Frequency Ordered Stop   09/14/15 0100  piperacillin-tazobactam (ZOSYN) IVPB 3.375 g  Status:  Discontinued     3.375 g 12.5 mL/hr over 240 Minutes Intravenous Every 8 hours 09/13/15 2122 09/16/15 1809   09/13/15 2200  vancomycin (VANCOCIN) 500 mg in sodium chloride 0.9 % 100 mL IVPB     500 mg 100 mL/hr over 60 Minutes Intravenous Every 24 hours 09/13/15 0245     09/13/15 1530  acyclovir (ZOVIRAX) 250 mg in dextrose 5 % 100 mL IVPB  Status:  Discontinued     250 mg 105 mL/hr over 60 Minutes Intravenous Every 12 hours 09/13/15 1518 09/14/15 0903   09/13/15 0300  piperacillin-tazobactam (ZOSYN) IVPB 3.375 g  Status:  Discontinued     3.375 g 12.5 mL/hr over 240 Minutes Intravenous 3 times per day 09/13/15 0245 09/13/15 2119   09/16/2015 2315  vancomycin (  VANCOCIN) IVPB 1000 mg/200 mL premix     1,000 mg 200 mL/hr over 60 Minutes Intravenous NOW 09/13/2015 2303 09/13/15 0143        HPI/Subjective: Patient reports improvement in nausea, facial swelling and pain, very uncomfortable   Objective: Filed Vitals:   09/18/15 2134 09/19/15 0611  BP: 130/91 120/91  Pulse: 110 102  Temp: 98.3 F (36.8 C) 97.5 F (36.4 C)  Resp: 20 19    Intake/Output Summary (Last 24  hours) at 09/19/15 1127 Last data filed at 09/19/15 1056  Gross per 24 hour  Intake 851.67 ml  Output    850 ml  Net   1.67 ml   Filed Weights   09/13/15 1406  Weight: 47.945 kg (105 lb 11.2 oz)    Exam:   General:  Awake, ill appearing, enlarged left parotid gland  Cardiovascular: rrr  Respiratory: clear  Abdomen: thin, + BS  Musculoskeletal: + edema  Data Reviewed: Basic Metabolic Panel:  Recent Labs Lab 09/14/15 0658 09/15/15 0605 09/16/15 0601 09/16/15 1840 09/17/15 0622 09/18/15 0814 09/19/15 0910  NA 135 135 141  --  142  --  142  K 3.2* 3.5 2.8* 3.2* 3.1*  --  3.6  CL 102 100* 108  --  112*  --  113*  CO2 24 24 26   --  25  --  22  GLUCOSE 38* 54* 77  --  70  --  113*  BUN 38* 32* 24*  --  20  --  17  CREATININE 1.59* 1.58* 1.45*  --  1.46*  --  1.39*  CALCIUM 8.1* 8.2* 8.1*  --  8.1*  --  8.2*  MG  --  1.4* 1.9  --  1.7 1.5* 1.9  PHOS  --  4.1 3.8  --  3.7 3.5 3.2   Liver Function Tests:  Recent Labs Lab 09/27/2015 1625 09/13/15 0308 09/14/15 0658 09/15/15 0605 09/19/15 0910  AST 19 15 15 21 16   ALT 16 12* 14 13* 12*  ALKPHOS 368* 291* 283* 287* 252*  BILITOT 0.5 0.5 0.6 1.0 0.2*  PROT 5.6* 4.4* 4.9* 4.6* 5.0*  ALBUMIN 1.2* <1.0* <1.0* <1.0* <1.0*    Recent Labs Lab 09/19/2015 1625  LIPASE 14   No results for input(s): AMMONIA in the last 168 hours. CBC:  Recent Labs Lab 09/13/15 0308 09/14/15 [id number] 09/15/15 0605 09/16/15 0601 09/17/15 0622 09/19/15 0910  WBC 17.3* 25.2* 15.0* 8.1 6.3 14.9*  NEUTROABS 15.8*  --   --   --   --   --   HGB 6.1* 9.3* 8.6* 8.5* 8.4* 9.5*  HCT 20.2* 27.4* 26.9* 26.2* 26.4* 30.6*  MCV 82.8 81.5 84.3 84.5 84.1 85.5  PLT 437* 413* 375 322 339 333   Cardiac Enzymes:  Recent Labs Lab 09/23/2015 2242  CKTOTAL 15*   BNP (last 3 results) No results for input(s): BNP in the last 8760 hours.  ProBNP (last 3 results) No results for input(s): PROBNP in the last 8760 hours.  CBG:  Recent Labs Lab  09/14/15 0957 09/15/15 0847  GLUCAP 78 83    Recent Results (from the past 240 hour(s))  Culture, blood (Routine X 2) w Reflex to ID Panel     Status: None   Collection Time: 09/07/2015  9:17 PM  Result Value Ref Range Status   Specimen Description BLOOD RIGHT ARM  Final   Special Requests IN PEDIATRIC BOTTLE [place]  Final   Culture NO GROWTH 5 DAYS  Final  Report Status 09/17/2015 FINAL  Final  Culture, blood (Routine X 2) w Reflex to ID Panel     Status: None   Collection Time: 09/05/2015  9:30 PM  Result Value Ref Range Status   Specimen Description BLOOD LEFT ARM  Final   Special Requests BOTTLES DRAWN AEROBIC AND ANAEROBIC 5CC  Final   Culture  Setup Time   Final    GRAM POSITIVE COCCI IN CLUSTERS AEROBIC BOTTLE ONLY CRITICAL RESULT CALLED TO, READ BACK BY AND VERIFIED WITHHenrietta Dine RN 2246 09/13/15 A BROWNING    Culture METHICILLIN RESISTANT STAPHYLOCOCCUS AUREUS  Final   Report Status 09/15/2015 FINAL  Final   Organism ID, Bacteria METHICILLIN RESISTANT STAPHYLOCOCCUS AUREUS  Final      Susceptibility   Methicillin resistant staphylococcus aureus - MIC*    CIPROFLOXACIN <=0.5 SENSITIVE Sensitive     ERYTHROMYCIN >=8 RESISTANT Resistant     GENTAMICIN <=0.5 SENSITIVE Sensitive     OXACILLIN >=4 RESISTANT Resistant     TETRACYCLINE <=1 SENSITIVE Sensitive     VANCOMYCIN 1 SENSITIVE Sensitive     TRIMETH/SULFA <=10 SENSITIVE Sensitive     CLINDAMYCIN <=0.25 SENSITIVE Sensitive     RIFAMPIN <=0.5 SENSITIVE Sensitive     Inducible Clindamycin NEGATIVE Sensitive     * METHICILLIN RESISTANT STAPHYLOCOCCUS AUREUS  Urine culture     Status: None   Collection Time: 09/08/2015 10:22 PM  Result Value Ref Range Status   Specimen Description URINE, CATHETERIZED  Final   Special Requests NONE  Final   Culture 10,000 COLONIES/mL PSEUDOMONAS AERUGINOSA  Final   Report Status 09/15/2015 FINAL  Final   Organism ID, Bacteria PSEUDOMONAS AERUGINOSA  Final      Susceptibility   Pseudomonas  aeruginosa - MIC*    CEFTAZIDIME 4 SENSITIVE Sensitive     CIPROFLOXACIN <=0.25 SENSITIVE Sensitive     GENTAMICIN 2 SENSITIVE Sensitive     IMIPENEM 2 SENSITIVE Sensitive     PIP/TAZO 8 SENSITIVE Sensitive     CEFEPIME 4 SENSITIVE Sensitive     * 10,000 COLONIES/mL PSEUDOMONAS AERUGINOSA  MRSA PCR Screening     Status: Abnormal   Collection Time: 09/13/15  2:21 PM  Result Value Ref Range Status   MRSA by PCR POSITIVE (A) NEGATIVE Final    Comment: RESULT CALLED TO, READ BACK BY AND VERIFIED WITH: Autumn Debar RN 17:25 09/13/15 ([email or web address])        The GeneXpert MRSA Assay (FDA approved for NASAL specimens only), is one component of a comprehensive MRSA colonization surveillance program. It is not intended to diagnose MRSA infection nor to guide or monitor treatment for MRSA infections.   Culture, blood (routine x 2)     Status: None   Collection Time: 09/14/15  2:36 PM  Result Value Ref Range Status   Specimen Description BLOOD LEFT HAND  Final   Special Requests BOTTLES DRAWN AEROBIC AND ANAEROBIC 10CC  Final   Culture NO GROWTH 5 DAYS  Final   Report Status 09/19/2015 FINAL  Final  Culture, blood (routine x 2)     Status: None   Collection Time: 09/14/15  2:39 PM  Result Value Ref Range Status   Specimen Description BLOOD RIGHT HAND  Final   Special Requests BOTTLES DRAWN AEROBIC AND ANAEROBIC 10CC  Final   Culture NO GROWTH 5 DAYS  Final   Report Status 09/19/2015 FINAL  Final     Studies: No results found.  Scheduled Meds: . antiseptic oral rinse  7  mL Mouth Rinse BID  . buPROPion  300 mg Oral Daily  . feeding supplement  1 Container Oral BID BM  . feeding supplement (ENSURE ENLIVE)  237 mL Oral BID BM  . LORazepam      . LORazepam  0.5 mg Oral Once  . multivitamin with minerals  1 tablet Oral Daily  . pantoprazole  40 mg Oral Daily  . vancomycin  500 mg Intravenous Q24H  . vitamin B-12  1,000 mcg Oral Daily   Continuous Infusions: . 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 If 7PM-7AM, please contact night-coverage at [email or web address], password [email or web address] 09/19/2015, 11:27 AM  LOS: 7 days

## 2015-09-19 NOTE — Progress Notes (Signed)
Called report to Baltazar Najjar, NP regarding results of ABG. See new orders. Ok to leave patient on NRB overnight and wean if patient can tolerate

## 2015-09-19 NOTE — Progress Notes (Signed)
Peripherally Inserted Central Catheter/Midline Placement  The IV Nurse has discussed with the patient and/or persons authorized to consent for the patient, the purpose of this procedure and the potential benefits and risks involved with this procedure.  The benefits include less needle sticks, lab draws from the catheter and patient may be discharged home with the catheter.  Risks include, but not limited to, infection, bleeding, blood clot (thrombus formation), and puncture of an artery; nerve damage and irregular heat beat.  Alternatives to this procedure were also discussed.  PICC/Midline Placement Documentation        Autumn Turner 09/19/2015, 8:13 AM

## 2015-09-19 NOTE — Progress Notes (Signed)
Patient placed on nonrebreather - O2 sats came up to 91%. CXR obtained. Stat ABGs in progress.

## 2015-09-19 NOTE — Progress Notes (Signed)
Subjective: Patient reports improvement in facial swelling and pain.  Objective: Vital signs in last 24 hours: Temp:  [97.5 F (36.4 C)-99.1 F (37.3 C)] 97.5 F (36.4 C) (01/18 0611) Pulse Rate:  [102-110] 102 (01/18 0611) Resp:  [19-20] 19 (01/18 0611) BP: (120-130)/(89-91) 120/91 mmHg (01/18 0611) SpO2:  [96 %-100 %] 97 % (01/18 [id number])  Physical Exam  Constitutional: She appears cachectic.  Head: Atraumatic.  Ears: Normal auricles and EACs. Nose/Mouth/Throat: Mucous membranes are dry.  Swelling, tenderness of the left parotid gland has significantly improved. Eyes: Conjunctivae are normal. Right eye exhibits no discharge. Left eye exhibits no discharge.  Neck: Normal range of motion. Neck supple.  Cardiovascular: Normal rate, regular rhythm and normal heart sounds.  Pulmonary/Chest: Effort normal and breath sounds normal. No respiratory distress. She has no wheezes.  Skin: Skin is warm and dry.    Recent Labs  09/17/15 0622  WBC 6.3  HGB 8.4*  HCT 26.4*  PLT 339    Recent Labs  09/16/15 1840 09/17/15 0622  NA  --  142  K 3.2* 3.1*  CL  --  112*  CO2  --  25  GLUCOSE  --  70  BUN  --  20  CREATININE  --  1.46*  CALCIUM  --  8.1*    Medications:  I have reviewed the patient's current medications. Scheduled: . antiseptic oral rinse  7 mL Mouth Rinse BID  . buPROPion  300 mg Oral Daily  . feeding supplement  1 Container Oral BID BM  . feeding supplement (ENSURE ENLIVE)  237 mL Oral BID BM  . LORazepam  0.5 mg Oral Once  . multivitamin with minerals  1 tablet Oral Daily  . pantoprazole  40 mg Oral Daily  . vancomycin  500 mg Intravenous Q24H  . vitamin B-12  1,000 mcg Oral Daily   [id number] **OR** acetaminophen, benzocaine, diphenhydrAMINE, loratadine, morphine injection, ondansetron **OR** ondansetron (ZOFRAN) IV, oxyCODONE, promethazine, senna-docusate, traMADol  Assessment/Plan: Acute left parotitis, secondary to severe dehydration --  much improved. WBC has normalized. Continue to encourage oral liquid intake. Continue abx.    LOS: 7 days   Gabryelle Whitmoyer,SUI W 09/19/2015, 7:34 AM

## 2015-09-19 NOTE — Progress Notes (Signed)
ANTIBIOTIC CONSULT NOTE - INITIAL  Pharmacy Consult for Zosyn Indication: worsening cavitary PNA  Allergies  Allergen Reactions  . Carafate [Sucralfate] Other (See Comments)    unspecified  . Ciprocinonide [Fluocinolone] Hives and Rash  . Ciprofloxacin Other (See Comments)    Blisters, boils  . Dust Mite Extract Itching  . Iohexol Hives       . Nsaids Other (See Comments)    Due to Hx of gastric bypass   Labs:  Recent Labs  09/17/15 0622 09/19/15 0910  WBC 6.3 14.9*  HGB 8.4* 9.5*  PLT 339 333  CREATININE 1.46* 1.39*   Microbiology: Recent Results (from the past 720 hour(s))  Culture, blood (Routine X 2) w Reflex to ID Panel     Status: None   Collection Time: 09/28/2015  9:17 PM  Result Value Ref Range Status   Specimen Description BLOOD RIGHT ARM  Final   Special Requests IN PEDIATRIC BOTTLE 2CC  Final   Culture NO GROWTH 5 DAYS  Final   Report Status 09/17/2015 FINAL  Final  Culture, blood (Routine X 2) w Reflex to ID Panel     Status: None   Collection Time: 09/10/2015  9:30 PM  Result Value Ref Range Status   Specimen Description BLOOD LEFT ARM  Final   Special Requests BOTTLES DRAWN AEROBIC AND ANAEROBIC 5CC  Final   Culture  Setup Time   Final    GRAM POSITIVE COCCI IN CLUSTERS AEROBIC BOTTLE ONLY CRITICAL RESULT CALLED TO, READ BACK BY AND VERIFIED WITHHenrietta Dine RN 2246 09/13/15 A BROWNING    Culture METHICILLIN RESISTANT STAPHYLOCOCCUS AUREUS  Final   Report Status 09/15/2015 FINAL  Final   Organism ID, Bacteria METHICILLIN RESISTANT STAPHYLOCOCCUS AUREUS  Final      Susceptibility   Methicillin resistant staphylococcus aureus - MIC*    CIPROFLOXACIN <=0.5 SENSITIVE Sensitive     ERYTHROMYCIN >=8 RESISTANT Resistant     GENTAMICIN <=0.5 SENSITIVE Sensitive     OXACILLIN >=4 RESISTANT Resistant     TETRACYCLINE <=1 SENSITIVE Sensitive     VANCOMYCIN 1 SENSITIVE Sensitive     TRIMETH/SULFA <=10 SENSITIVE Sensitive     CLINDAMYCIN <=0.25 SENSITIVE  Sensitive     RIFAMPIN <=0.5 SENSITIVE Sensitive     Inducible Clindamycin NEGATIVE Sensitive     * METHICILLIN RESISTANT STAPHYLOCOCCUS AUREUS  Urine culture     Status: None   Collection Time: 10/01/2015 10:22 PM  Result Value Ref Range Status   Specimen Description URINE, CATHETERIZED  Final   Special Requests NONE  Final   Culture 10,000 COLONIES/mL PSEUDOMONAS AERUGINOSA  Final   Report Status 09/15/2015 FINAL  Final   Organism ID, Bacteria PSEUDOMONAS AERUGINOSA  Final      Susceptibility   Pseudomonas aeruginosa - MIC*    CEFTAZIDIME 4 SENSITIVE Sensitive     CIPROFLOXACIN <=0.25 SENSITIVE Sensitive     GENTAMICIN 2 SENSITIVE Sensitive     IMIPENEM 2 SENSITIVE Sensitive     PIP/TAZO 8 SENSITIVE Sensitive     CEFEPIME 4 SENSITIVE Sensitive     * 10,000 COLONIES/mL PSEUDOMONAS AERUGINOSA  MRSA PCR Screening     Status: Abnormal   Collection Time: 09/13/15  2:21 PM  Result Value Ref Range Status   MRSA by PCR POSITIVE (A) NEGATIVE Final    Comment: RESULT CALLED TO, READ BACK BY AND VERIFIED WITH: Donnal Debar RN 17:25 09/13/15 ([email or web address])        The GeneXpert MRSA Assay (FDA approved for  NASAL specimens only), is one component of a comprehensive MRSA colonization surveillance program. It is not intended to diagnose MRSA infection nor to guide or monitor treatment for MRSA infections.   Culture, blood (routine x 2)     Status: None   Collection Time: 09/14/15  2:36 PM  Result Value Ref Range Status   Specimen Description BLOOD LEFT HAND  Final   Special Requests BOTTLES DRAWN AEROBIC AND ANAEROBIC 10CC  Final   Culture NO GROWTH 5 DAYS  Final   Report Status 09/19/2015 FINAL  Final  Culture, blood (routine x 2)     Status: None   Collection Time: 09/14/15  2:39 PM  Result Value Ref Range Status   Specimen Description BLOOD RIGHT HAND  Final   Special Requests BOTTLES DRAWN AEROBIC AND ANAEROBIC 10CC  Final   Culture NO GROWTH 5 DAYS  Final   Report Status 09/19/2015  FINAL  Final     Assessment/Plan:  55yo female desat to 63s, no inc'd WOB, on vanc for MRSA bacteremia, CXR shows worsening interstitial and airspace dz throughout lungs but greater extent on right, concern for severe PNA, to broaden IV ABX.  Will start Zosyn 3.375g IV Q8H and monitor CBC and Cx.  Wynona Neat, PharmD, BCPS  09/19/2015,10:56 PM

## 2015-09-19 NOTE — Progress Notes (Signed)
Notified on call of patient's 02 saturations 80%, tachycardia, tachypneic. Patient in no visible distress at this time. On 5L [place] will continue to monitor.

## 2015-09-20 LAB — COMPREHENSIVE METABOLIC PANEL
ALK PHOS: 247 U/L — AB (ref 38–126)
ALT: 12 U/L — AB (ref 14–54)
ANION GAP: 8 (ref 5–15)
AST: 16 U/L (ref 15–41)
Albumin: <1 g/dL — ABNORMAL LOW (ref 3.5–5.0)
BILIRUBIN TOTAL: 0.4 mg/dL (ref 0.3–1.2)
BUN: 21 mg/dL — ABNORMAL HIGH (ref 6–20)
CALCIUM: 8.2 mg/dL — AB (ref 8.9–10.3)
CO2: 20 mmol/L — AB (ref 22–32)
CREATININE: 1.62 mg/dL — AB (ref 0.44–1.00)
Chloride: 116 mmol/L — ABNORMAL HIGH (ref 101–111)
GFR calc non Af Amer: 34 mL/min — ABNORMAL LOW (ref >60)
GFR, EST AFRICAN AMERICAN: 40 mL/min — AB (ref >60)
Glucose, Bld: 152 mg/dL — ABNORMAL HIGH (ref 65–99)
Potassium: 3.7 mmol/L (ref 3.5–5.1)
SODIUM: 144 mmol/L (ref 135–145)
TOTAL PROTEIN: 5.1 g/dL — AB (ref 6.5–8.1)

## 2015-09-20 LAB — BLOOD GAS, ARTERIAL
ACID-BASE DEFICIT: 4.9 mmol/L — AB (ref 0.0–2.0)
BICARBONATE: 19.8 meq/L — AB (ref 20.0–24.0)
Drawn by: 449561
FIO2: 1
O2 Saturation: 87.3 %
PATIENT TEMPERATURE: 98.6
PCO2 ART: 37.2 mmHg (ref 35.0–45.0)
PO2 ART: 62.2 mmHg — AB (ref 80.0–100.0)
TCO2: 20.9 mmol/L (ref 0–100)
pH, Arterial: 7.346 — ABNORMAL LOW (ref 7.350–7.450)

## 2015-09-20 LAB — CBC
HEMATOCRIT: 30 % — AB (ref 36.0–46.0)
HEMOGLOBIN: 9.4 g/dL — AB (ref 12.0–15.0)
MCH: 27.5 pg (ref 26.0–34.0)
MCHC: 31.3 g/dL (ref 30.0–36.0)
MCV: 87.7 fL (ref 78.0–100.0)
Platelets: 343 10*3/uL (ref 150–400)
RBC: 3.42 MIL/uL — ABNORMAL LOW (ref 3.87–5.11)
RDW: 17.7 % — AB (ref 11.5–15.5)
WBC: 24.8 10*3/uL — ABNORMAL HIGH (ref 4.0–10.5)

## 2015-09-20 LAB — DIFFERENTIAL
Basophils Absolute: 0 10*3/uL (ref 0.0–0.1)
Basophils Relative: 0 %
EOS PCT: 0 %
Eosinophils Absolute: 0 10*3/uL (ref 0.0–0.7)
LYMPHS ABS: 1.4 10*3/uL (ref 0.7–4.0)
Lymphocytes Relative: 6 %
MONO ABS: 0.8 10*3/uL (ref 0.1–1.0)
MONOS PCT: 3 %
NEUTROS ABS: 22.5 10*3/uL — AB (ref 1.7–7.7)
Neutrophils Relative %: 91 %

## 2015-09-20 LAB — PREALBUMIN: Prealbumin: 5.5 mg/dL — ABNORMAL LOW (ref 18–38)

## 2015-09-20 LAB — PHOSPHORUS: Phosphorus: 4.3 mg/dL (ref 2.5–4.6)

## 2015-09-20 LAB — GLUCOSE, CAPILLARY
GLUCOSE-CAPILLARY: 190 mg/dL — AB (ref 65–99)
Glucose-Capillary: 205 mg/dL — ABNORMAL HIGH (ref 65–99)

## 2015-09-20 LAB — MAGNESIUM: Magnesium: 1.8 mg/dL (ref 1.7–2.4)

## 2015-09-20 LAB — TRIGLYCERIDES: TRIGLYCERIDES: 121 mg/dL (ref <150)

## 2015-09-20 MED ORDER — FUROSEMIDE 10 MG/ML IJ SOLN
60.0000 mg | Freq: Once | INTRAMUSCULAR | Status: AC
  Administered 2015-09-20: 60 mg via INTRAVENOUS
  Filled 2015-09-20: qty 6

## 2015-09-20 MED ORDER — FAT EMULSION 20 % IV EMUL
144.0000 mL | INTRAVENOUS | Status: DC
  Filled 2015-09-20: qty 200

## 2015-09-20 MED ORDER — GLYCOPYRROLATE 0.2 MG/ML IJ SOLN
0.2000 mg | INTRAMUSCULAR | Status: DC | PRN
  Filled 2015-09-20: qty 1

## 2015-09-20 MED ORDER — INSULIN ASPART 100 UNIT/ML ~~LOC~~ SOLN
0.0000 [IU] | SUBCUTANEOUS | Status: DC
  Administered 2015-09-20: 2 [IU] via SUBCUTANEOUS

## 2015-09-20 MED ORDER — HALOPERIDOL LACTATE 5 MG/ML IJ SOLN: 0.5000 mg | INTRAMUSCULAR | Status: DC | PRN

## 2015-09-20 MED ORDER — DEXTROSE-NACL 5-0.9 % IV SOLN: INTRAVENOUS | Status: DC

## 2015-09-20 MED ORDER — LORAZEPAM 2 MG/ML IJ SOLN: 1.0000 mg | INTRAMUSCULAR | Status: DC | PRN

## 2015-09-20 MED ORDER — LORAZEPAM 1 MG PO TABS: 1.0000 mg | ORAL_TABLET | ORAL | Status: DC | PRN

## 2015-09-20 MED ORDER — BISACODYL 10 MG RE SUPP: 10.0000 mg | Freq: Every day | RECTAL | Status: DC | PRN

## 2015-09-20 MED ORDER — LORAZEPAM 2 MG/ML PO CONC: 1.0000 mg | ORAL | Status: DC | PRN

## 2015-09-20 MED ORDER — GLYCOPYRROLATE 1 MG PO TABS
1.0000 mg | ORAL_TABLET | ORAL | Status: DC | PRN
  Filled 2015-09-20: qty 1

## 2015-09-20 MED ORDER — MORPHINE SULFATE 25 MG/ML IV SOLN
1.0000 mg/h | INTRAVENOUS | Status: DC
  Administered 2015-09-20: 1 mg/h via INTRAVENOUS
  Filled 2015-09-20: qty 10

## 2015-09-20 MED ORDER — DIPHENHYDRAMINE HCL 50 MG/ML IJ SOLN: 12.5000 mg | INTRAMUSCULAR | Status: DC | PRN

## 2015-09-20 MED ORDER — MORPHINE BOLUS VIA INFUSION
2.0000 mg | INTRAVENOUS | Status: DC | PRN
Start: 2015-09-20 — End: 2015-09-20
  Administered 2015-09-20: 2 mg via INTRAVENOUS
  Filled 2015-09-20 (×2): qty 2

## 2015-09-20 MED ORDER — HALOPERIDOL LACTATE 2 MG/ML PO CONC
0.5000 mg | ORAL | Status: DC | PRN
  Filled 2015-09-20: qty 0.3

## 2015-09-20 MED ORDER — HALOPERIDOL 0.5 MG PO TABS
0.5000 mg | ORAL_TABLET | ORAL | Status: DC | PRN
  Filled 2015-09-20: qty 1

## 2015-09-20 MED ORDER — TRACE MINERALS CR-CU-MN-SE-ZN 10-1000-500-60 MCG/ML IV SOLN
INTRAVENOUS | Status: DC
  Filled 2015-09-20: qty 960

## 2015-09-26 LAB — AFB CULTURE WITH SMEAR (NOT AT ARMC): Acid Fast Smear: NONE SEEN

## 2015-09-29 ENCOUNTER — Encounter: Payer: Self-pay | Admitting: Internal Medicine

## 2015-10-03 NOTE — Progress Notes (Signed)
Attempted to wean patient off NRB. Patient desaturated to 60's on 6L [place]

## 2015-10-03 NOTE — Progress Notes (Signed)
ANTIBIOTIC CONSULT NOTE -  Follow up  Pharmacy Consult for Vancomycin and Zosyn  Indication:  Staph aureus bacteremia; worsening cavitary pneumonia   Allergies  Allergen Reactions  . Carafate [Sucralfate] Other (See Comments)    unspecified  . Ciprocinonide [Fluocinolone] Hives and Rash  . Ciprofloxacin Other (See Comments)    Blisters, boils  . Dust Mite Extract Itching  . Iohexol Hives       . Nsaids Other (See Comments)    Due to Hx of gastric bypass    Patient Measurements: Height: 5\' 4"  (162.6 cm) Weight: 105 lb 11.2 oz (47.945 kg) IBW/kg (Calculated) : 54.7  Vital Signs: Temp: 98.2 F (36.8 C) (01/19 0524) Temp Source: Oral (01/19 0524) BP: 130/92 mmHg (01/19 0524) Pulse Rate: 115 (01/19 0524) Intake/Output from previous day: 01/18 0701 - 01/19 0700 In: 692 [P.O.:130; I.V.:562] Out: 900 [Urine:900] Intake/Output from this shift:    Labs:  Recent Labs  09/19/15 0910 October 08, 2015 0459  WBC 14.9* 24.8*  HGB 9.5* 9.4*  PLT 333 343  CREATININE 1.39* 1.62*   Estimated Creatinine Clearance: 29.3 mL/min (by C-G formula based on Cr of 1.62). No results for input(s): VANCOTROUGH, VANCOPEAK, VANCORANDOM, GENTTROUGH, GENTPEAK, GENTRANDOM, TOBRATROUGH, TOBRAPEAK, TOBRARND, AMIKACINPEAK, AMIKACINTROU, AMIKACIN in the last 72 hours.   Microbiology: Recent Results (from the past 720 hour(s))  Culture, blood (Routine X 2) w Reflex to ID Panel     Status: None   Collection Time: 10/01/2015  9:17 PM  Result Value Ref Range Status   Specimen Description BLOOD RIGHT ARM  Final   Special Requests IN PEDIATRIC BOTTLE 2CC  Final   Culture NO GROWTH 5 DAYS  Final   Report Status 09/17/2015 FINAL  Final  Culture, blood (Routine X 2) w Reflex to ID Panel     Status: None   Collection Time: 10/02/2015  9:30 PM  Result Value Ref Range Status   Specimen Description BLOOD LEFT ARM  Final   Special Requests BOTTLES DRAWN AEROBIC AND ANAEROBIC 5CC  Final   Culture  Setup Time   Final     GRAM POSITIVE COCCI IN CLUSTERS AEROBIC BOTTLE ONLY CRITICAL RESULT CALLED TO, READ BACK BY AND VERIFIED WITHHenrietta Dine RN 2246 09/13/15 A BROWNING    Culture METHICILLIN RESISTANT STAPHYLOCOCCUS AUREUS  Final   Report Status 09/15/2015 FINAL  Final   Organism ID, Bacteria METHICILLIN RESISTANT STAPHYLOCOCCUS AUREUS  Final      Susceptibility   Methicillin resistant staphylococcus aureus - MIC*    CIPROFLOXACIN <=0.5 SENSITIVE Sensitive     ERYTHROMYCIN >=8 RESISTANT Resistant     GENTAMICIN <=0.5 SENSITIVE Sensitive     OXACILLIN >=4 RESISTANT Resistant     TETRACYCLINE <=1 SENSITIVE Sensitive     VANCOMYCIN 1 SENSITIVE Sensitive     TRIMETH/SULFA <=10 SENSITIVE Sensitive     CLINDAMYCIN <=0.25 SENSITIVE Sensitive     RIFAMPIN <=0.5 SENSITIVE Sensitive     Inducible Clindamycin NEGATIVE Sensitive     * METHICILLIN RESISTANT STAPHYLOCOCCUS AUREUS  Urine culture     Status: None   Collection Time: 09/04/2015 10:22 PM  Result Value Ref Range Status   Specimen Description URINE, CATHETERIZED  Final   Special Requests NONE  Final   Culture 10,000 COLONIES/mL PSEUDOMONAS AERUGINOSA  Final   Report Status 09/15/2015 FINAL  Final   Organism ID, Bacteria PSEUDOMONAS AERUGINOSA  Final      Susceptibility   Pseudomonas aeruginosa - MIC*    CEFTAZIDIME 4 SENSITIVE Sensitive  CIPROFLOXACIN <=0.25 SENSITIVE Sensitive     GENTAMICIN 2 SENSITIVE Sensitive     IMIPENEM 2 SENSITIVE Sensitive     PIP/TAZO 8 SENSITIVE Sensitive     CEFEPIME 4 SENSITIVE Sensitive     * 10,000 COLONIES/mL PSEUDOMONAS AERUGINOSA  MRSA PCR Screening     Status: Abnormal   Collection Time: 09/13/15  2:21 PM  Result Value Ref Range Status   MRSA by PCR POSITIVE (A) NEGATIVE Final    Comment: RESULT CALLED TO, READ BACK BY AND VERIFIED WITH: RTarry Kos RN 17:25 09/13/15 ([email or web address])        The GeneXpert MRSA Assay (FDA approved for NASAL specimens only), is one component of a comprehensive MRSA  colonization surveillance program. It is not intended to diagnose MRSA infection nor to guide or monitor treatment for MRSA infections.   Culture, blood (routine x 2)     Status: None   Collection Time: 09/14/15  2:36 PM  Result Value Ref Range Status   Specimen Description BLOOD LEFT HAND  Final   Special Requests BOTTLES DRAWN AEROBIC AND ANAEROBIC 10CC  Final   Culture NO GROWTH 5 DAYS  Final   Report Status 09/19/2015 FINAL  Final  Culture, blood (routine x 2)     Status: None   Collection Time: 09/14/15  2:39 PM  Result Value Ref Range Status   Specimen Description BLOOD RIGHT HAND  Final   Special Requests BOTTLES DRAWN AEROBIC AND ANAEROBIC 10CC  Final   Culture NO GROWTH 5 DAYS  Final   Report Status 09/19/2015 FINAL  Final    Medical History: Past Medical History  Diagnosis Date  . Abnormal Pap smear   . Vulvar lesion   . CIN II (cervical intraepithelial neoplasia II)   . Malnourished ([place])   . Sleep apnea     Loss weight with gastric bypass  . Cancer ([place])     vulvar  . Anemia     blood transfusion  . Hypertension     no longer on medication  . Anxiety   . Depression   . History of stomach ulcers     Medications:  Wellbutrin  Ultram  Librax  Cleocin  Nexium  Claritin  MVI  Assessment: 55yo female desat to 45s, no inc'd WOB, on vanc for MRSA bacteremia, CXR shows worsening interstitial and airspace dz throughout lungs but greater extent on right, concern for severe PNA, to broaden IV ABX. Started on Zosyn 3.375g IV Q8H and monitor CBC and Cx. ID recommends 6 weeks of vancomycin therapy  SCr has trended up today. Vanc trough was supposed to be collected yesterday but dose was hung before level was drawn. Given that SCr has trended up today, will attempt a recollect of Vanc trough tonight.   1/12 Zosyn >>1/15; 1/19>> 1/12 Vanc >> 2/22 (for 6 total weeks of therapy) 1/12 Acyclovir (herpes skin infxn) >>1 /13  1/15 VT = 20 mcg/mL on 500mg  q24 >> no  change  1/11 BCx x2 - MRSA (1 of 2) 1/11 UCx - Pseusomonas (pan sens, asymptomatic) 1/13 MRSA PCR - positive 1/13 BCx x2 - NG   Goal of Therapy:  Vancomycin trough level 15-20 mcg/ml  Plan:  - Vanc 500mg  IV Q24H. Stop date 2/22  - VT tonight - Monitor renal fx and clinical progress closely  Albertina Parr, PharmD., BCPS Clinical Pharmacist Pager [phone]

## 2015-10-03 NOTE — Progress Notes (Signed)
1754 Patient has expired. Family at the bedside. Morphine IV drip wasted with Ginger Gleason, over 26ml wasted. MD notified of passing.

## 2015-10-03 NOTE — Progress Notes (Signed)
Daily Progress Note   Patient Name: Autumn Turner       Date: 2015-09-25 DOB: [birth year]  Age: 55 y.o. MRN#: [id number] Attending Physician: Reyne Dumas, MD Primary Care Physician: Robyn Haber, MD Admit Date: 09/28/2015  Reason for Consultation/Follow-up: Establishing goals of care  Subjective: Overnight events noted. Patient on non-rebreather this morning. She is awake and interactive but difficult to understand.  I believe that some level of confusion is contributing to this.    She was started on TPN yesterday. She then decompensated overnight.  Length of Stay: 8 days  Current Medications: Scheduled Meds:  . antiseptic oral rinse  7 mL Mouth Rinse BID  . buPROPion  300 mg Oral Daily  . feeding supplement  1 Container Oral BID BM  . feeding supplement (ENSURE ENLIVE)  237 mL Oral BID BM  . insulin aspart  0-9 Units Subcutaneous 6 times per day  . LORazepam  0.5 mg Oral Once  . pantoprazole  40 mg Oral Daily  . piperacillin-tazobactam (ZOSYN)  IV  3.375 g Intravenous Q8H  . vancomycin  500 mg Intravenous Q24H  . vitamin B-12  1,000 mcg Oral Daily    Continuous Infusions: . dextrose 5 % and 0.9 % NaCl with KCl 20 mEq/L 400 mL (09/19/15 1751)  . dextrose 5 % and 0.9% NaCl    . Marland KitchenTPN (CLINIMIX-E) Adult     And  . fat emulsion    . Marland KitchenTPN (CLINIMIX-E) Adult 20 mL/hr at 09/19/15 1741   And  . fat emulsion 72 mL (09/19/15 1740)    PRN Meds: acetaminophen **OR** acetaminophen, benzocaine, diphenhydrAMINE, levalbuterol, loratadine, LORazepam, morphine injection, ondansetron **OR** ondansetron (ZOFRAN) IV, oxyCODONE, promethazine, senna-docusate, sodium chloride, traMADol  Physical Exam: Physical Exam             Weak frail lady s1 s2 Lungs diminished No  edema Cachexia  Vital Signs: BP 130/92 mmHg  Pulse 115  Temp(Src) 98.2 F (36.8 C) (Oral)  Resp 22  Ht 5\' 4"  (1.626 m)  Wt 47.945 kg (105 lb 11.2 oz)  BMI 18.13 kg/m2  SpO2 86% SpO2: SpO2: (!) 86 % O2 Device: O2 Device: NRB O2 Flow Rate: O2 Flow Rate (L/min): 4 L/min  Intake/output summary:   Intake/Output Summary (Last 24 hours) at September 25, 2015 0955 Last data filed at 25-Sep-2015  0616  Gross per 24 hour  Intake    692 ml  Output    400 ml  Net    292 ml   LBM: Last BM Date: 09/19/15 Baseline Weight: Weight: 47.945 kg (105 lb 11.2 oz) Most recent weight: Weight: 47.945 kg (105 lb 11.2 oz)       Palliative Assessment/Data: Flowsheet Rows        Most Recent Value   Intake Tab    Referral Department  Hospitalist   Unit at Time of Referral  Med/Surg Unit   Palliative Care Primary Diagnosis  Other (Comment)   Date Notified  09/15/15   Palliative Care Type  Return patient Palliative Care   Reason for referral  Non-pain Symptom, Pain, Clarify Goals of Care, End of Life Care Assistance   Date of Admission  09/14/2015   Date first seen by Palliative Care  09/16/15   # of days Palliative referral response time  1 Day(s)   # of days IP prior to Palliative referral  3   Clinical Assessment    Palliative Performance Scale Score  20%   Pain Max last 24 hours  4   Pain Min Last 24 hours  3   Dyspnea Max Last 24 Hours  4   Dyspnea Min Last 24 hours  3   Nausea Max Last 24 Hours  4   Nausea Min Last 24 Hours  3   Psychosocial & Spiritual Assessment    Palliative Care Outcomes    Patient/Family meeting held?  Yes   Who was at the meeting?  husband over the phone   Palliative Care Outcomes  Improved pain interventions, Clarified goals of care, Counseled regarding hospice   Palliative Care follow-up planned  Yes, Facility      Additional Data Reviewed: CBC    Component Value Date/Time   WBC 24.8* 10/14/15 0459   WBC 4.7 01/07/2015 1417   RBC 3.42* 2015-10-14 0459   RBC 3.59*  01/07/2015 1417   HGB 9.4* 10-14-15 0459   HGB 9.0* 01/07/2015 1417   HCT 30.0* 2015/10/14 0459   HCT 29.6* 01/07/2015 1417   PLT 343 10/14/15 0459   MCV 87.7 14-Oct-2015 0459   MCV 82.4 01/07/2015 1417   MCH 27.5 2015-10-14 0459   MCH 25.0* 01/07/2015 1417   MCHC 31.3 14-Oct-2015 0459   MCHC 30.4* 01/07/2015 1417   RDW 17.7* 2015-10-14 0459   LYMPHSABS 1.4 2015-10-14 0459   MONOABS 0.8 10/14/15 0459   EOSABS 0.0 October 14, 2015 0459   BASOSABS 0.0 14-Oct-2015 0459    CMP     Component Value Date/Time   NA 144 Oct 14, 2015 0459   K 3.7 10-14-2015 0459   CL 116* 2015-10-14 0459   CO2 20* 10/14/2015 0459   GLUCOSE 152* 10-14-15 0459   BUN 21* October 14, 2015 0459   CREATININE 1.62* Oct 14, 2015 0459   CREATININE 0.55 01/07/2015 1406   CALCIUM 8.2* 2015/10/14 0459   PROT 5.1* 2015/10/14 0459   ALBUMIN <1.0* 10/14/15 0459   AST 16 2015-10-14 0459   ALT 12* October 14, 2015 0459   ALKPHOS 247* 14-Oct-2015 0459   BILITOT 0.4 2015/10/14 0459   GFRNONAA 34* October 14, 2015 0459   GFRAA 40* 10/14/15 0459       Problem List:  Patient Active Problem List   Diagnosis Date Noted  . Encounter for palliative care   . Jaw pain   . Goals of care, counseling/discussion   . Staphylococcus aureus bacteremia 09/14/2015  . Dehydration   .  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 Acute parotitis 09/13/2015  . ARF (acute renal failure) ([place]) 09/13/2015  . Failure to thrive in adult   . Bulimia nervosa 09/24/2015  . AKI (acute kidney injury) ([place]) 09/22/2015  . Sialadenitis 09/11/2015  . Hypoalbuminemia 09/11/2015  . Weakness 09/10/2015  . 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 Self induced vomiting 08/24/2015  . Narcotic drug use 08/24/2015  . Edema 08/23/2015  . Non compliance with medical treatment 08/22/2015  . Cognitive impairment 08/22/2015  . Depression 08/21/2015  . GERD (gastroesophageal reflux disease) 08/21/2015  . Vitamin D deficiency 08/21/2015  . Pressure ulcer  08/20/2015  . Protein-calorie malnutrition, severe 08/17/2015  . Pneumonia of right lung due to methicillin resistant Staphylococcus aureus (MRSA) ([place]) 08/17/2015  . Acute respiratory failure with hypoxia ([place]) 08/14/2015  . Necrotizing pneumonia ([place]) 08/14/2015  . Microcytic anemia 08/14/2015  . Sepsis ([place]) 08/14/2015  . Pleural effusion   . HCAP (healthcare-associated pneumonia) 08/13/2015  . Hypokalemia 08/13/2015  . Anemia 08/13/2015  . Abdominal pain 08/13/2015  . 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 Cellulitis of groin, right 12/16/2012  . 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 May use oxygen, suction and manual treatment of airway obstruction as needed for comfort.      09/16/15 1120    Code Status History    Date Active Date Inactive Code Status Order ID Comments User Context   09/13/2015  2:35 AM 09/16/2015 11:20 AM Full Code [id number]  Rise Patience, MD ED   08/13/2015  8:50 PM 08/20/2015  9:56 PM Full Code [id number]  Reubin Milan, MD ED   12/07/2012  4:35 PM 12/08/2012  4:33 PM Full Code [id number]  Lahoma Crocker, MD Inpatient       2. Goals of Care/Additional Recommendations:  Reviewed and noted that after she had worsening respiratory status last night, her husband spoke with on-call provider and no plans for escalation to BiPAP.  I called and left a message for her husband, [name], to call me back to discuss plan moving forward. With this  acute decompensation on top of her already frail state, I'm concerned that this will be a terminal event regardless of interventions moving forward. I believe the two most likely options will be to continue with current therapy the next 24-48 hours to see if she improves (and likely transition to comfort care at that time she does not) versus transition to comfort care at this time.  I will discuss these with her husband when he calls back.   Limitations on Scope of Treatment: dnr dni  Desire for further Chaplaincy support:no  Psycho-social Needs: Caregiving  Support/Resources  3. Symptom Management:      1. Morphine and oxy ir prn pain  2. zofran prn nausea   3. Add bowel regimen.   4. Palliative Prophylaxis:   Bowel Regimen  5. Prognosis: Unable to determine.  She has become acutely worse, and I think there is high likelihood that she will not recover from this event.  6. Discharge Planning:  To be determined   Care plan was discussed with Dr. Allyson Sabal.  Await return call from patient's husband, [name]   Thank you for allowing the Palliative Medicine Team to assist in the care of this patient.   Time In: 0945 Time Out: 1000 Total Time 15 Prolonged Time Billed  no        Micheline Rough, MD  03-Oct-2015, 9:55 AM  Please contact Palliative Medicine Team phone at [phone] for questions and concerns.

## 2015-10-03 NOTE — Progress Notes (Signed)
PARENTERAL NUTRITION CONSULT NOTE - FOLLOW UP  Pharmacy Consult for TPN Indication: Intolerance to TF and malnourishment  Allergies  Allergen Reactions  . Carafate [Sucralfate] Other (See Comments)    unspecified  . Ciprocinonide [Fluocinolone] Hives and Rash  . Ciprofloxacin Other (See Comments)    Blisters, boils  . Dust Mite Extract Itching  . Iohexol Hives       . Nsaids Other (See Comments)    Due to Hx of gastric bypass    Patient Measurements: Height: _0  (162.6 cm) Weight: 105 lb 11.2 oz (47.945 kg) IBW/kg (Calculated) : 54.7 Adjusted Body Weight:  Usual Weight:   Vital Signs: Temp: 98.2 F (36.8 C) (01/19 0524) Temp Source: Oral (01/19 0524) BP: 130/92 mmHg (01/19 0524) Pulse Rate: 115 (01/19 0524) Intake/Output from previous day: 01/18 0701 - 01/19 0700 In: 692 [P.O.:130; I.V.:562] Out: 900 [Urine:900] Intake/Output from this shift:    Labs:  Recent Labs  09/19/15 0910 09/21/15 0459  WBC 14.9* 24.8*  HGB 9.5* 9.4*  HCT 30.6* 30.0*  PLT 333 343     Recent Labs  09/18/15 0814 09/19/15 0910 2015-09-21 0459  NA  --  142 144  K  --  3.6 3.7  CL  --  113* 116*  CO2  --  22 20*  GLUCOSE  --  113* 152*  BUN  --  17 21*  CREATININE  --  1.39* 1.62*  CALCIUM  --  8.2* 8.2*  MG 1.5* 1.9 1.8  PHOS 3.5 3.2 4.3  PROT  --  5.0* 5.1*  ALBUMIN  --  <1.0* <1.0*  AST  --  16 16  ALT  --  12* 12*  ALKPHOS  --  252* 247*  BILITOT  --  0.2* 0.4  PREALBUMIN  --   --  5.5*  TRIG  --   --  121   Estimated Creatinine Clearance: 29.3 mL/min (by C-G formula based on Cr of 1.62).    Recent Labs  21-Sep-2015 0830  GLUCAP 205*    Medications:  Scheduled:  . antiseptic oral rinse  7 mL Mouth Rinse BID  . buPROPion  300 mg Oral Daily  . feeding supplement  1 Container Oral BID BM  . feeding supplement (ENSURE ENLIVE)  237 mL Oral BID BM  . insulin aspart  0-9 Units Subcutaneous Q6H  . LORazepam  0.5 mg Oral Once  . pantoprazole  40 mg Oral Daily  .  piperacillin-tazobactam (ZOSYN)  IV  3.375 g Intravenous Q8H  . vancomycin  500 mg Intravenous Q24H  . vitamin B-12  1,000 mcg Oral Daily    Insulin Requirements in the past 24 hours:  Sens SSI q6, received 0 units.  Nutritional Goals: per RD recs on 1/13 Kcal: 1500-1700 Protein: 60-75 g Fluid: 1.5-1.7 L  Goal TPN is Clinimix E5/15 at 58m/hr Goal IVFE is 145mhr  This will provide 72g of protein and 1502 kcal  Current Nutrition:  Clinimix-E 5/15 at 2072mr, providing 24g of protein & ~600 kcal- meeting ~33% of protein and ~38% of kcal needs D5NS _1 /hr (Provides ~200 kcal) Boost resource Breeze PO BID (Provides 18 g of protein and 500 kcal) Ensure Enlive PO BID (Provides 40 g of protein and 700 kcal) **Does not seem like patient is drinking Breeze and Enlive even though being charted**  Assessment: [age] F recently admitted to the hospital for cavitary PNA and was following up with ID in outpatient clinic when found to be very weak  so sent to the ED. Nutrition focused physical exam done last month on 12/15 which revealed severe fat and muscle depletion. Now found to have MRSA bacteremia and on IV vancomycin for that. 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 Husband reports she has had poor PO intake since early December and has been losing weight. Not a good candidate for J tube placement due to GI anatomy. Pharmacy consulted to start TPN for malnourishment and intolerance to enteral feeds. GI has talked to family and they understand this is just a short term option. Has severe malnutrition and is underweight. Seems to be high risk for refeeding syndrome.  Surgeries/Procedures: Has had multiple GI surgeries in the past. EGD w/ fb removal on 12/23, otherwise no recent procedures  GI:  Due to altered anatomy and revision, GI does not think an endoscopic placement of a J-tube is possible or feasible. Even though Breeze and Ensure is being  charted there is a lot of doubt that she is actually drinking any of them. Will plan to start and titrate up TPN as if patient is not receiving any other nutrition & advance cautiously due to high risk of refeeding syndrome. Albumin is severely low at < 1.0.  Palb 5.5- indicating sig nutritional depletion.  Last BM was 1/14. PPI, vitamin B12  Endo:  CBG 152 last night & 205 this AM.   Lytes:  K 3.7, Phos 4.3 and Mg 1.8. CoCa ~10.7, Ca-Phos product 46.  Renal:  Cr up to 1.62 this AM, CrCl ~104m/min. UOP 0.837mkg/hr yest, an improvement (received furosemide x 1).  Pulm:  Sig desats on Farina-6L o/n, now on NRB.  Husband does not want BiPAP as pt will fight it  Cards: BP ok and HR tachy  Hepatobil: LFTs ok, Alk Phos slightly elevated at 252. Tbili 0.2.  TG 121  Neuro: bupropion, lorazepam  ID:  Abx adjusted last PM:  MRSA bacteremia, acute L-parotitis, and worsening cavitary pna . Afebrile, WBC with sig jump to 24.8. Last VT therapeutic.  Vancomycin 1/12 >> Zosyn 1/18 >>  Blood cx 1/11 > 1/2 MRSA Blood cx 1/13 > ngF  Best Practices: SCDs  TPN Access: PICC 1/18 >>  TPN start date: 1/18 >>  Plan:  Inc Clinimix E5/15 to 4034mr (titrating slowly due to risk of refeeding)  Inc IVFE to 6ml83m Decrease D5NS w/ 20 KCl to KVO when TPN started (total IVF 50ml60m Continue MVI and trace elements in TPN Stop daily MVI po, as will have in TPN bag Change SSI to q4 TPN labs qMon/Thurs BMEt in AM Currently has Phos and Mg ordered daily per MD so will need to reorder biweekly Mg and Phos if discontinued   KendrGracy BruinsrmPeletier Hospital

## 2015-10-03 NOTE — Progress Notes (Signed)
I stopped by to check on Ms. Holdsworth again this afternoon. She has had a dramatic change in her clinical condition and is no longer responsive.  She appears to be actively dying.  I called and spoke with her husband, [name] who reported that he and Ms. Boston's children are on their way to the hospital.  I met with her husband [name]), her son, her daughter, and her son-in-law. We talked about her clinical course to this point in time as well as the dramatic change that they have noticed from when they visited with her this morning.  Her family is in agreement that her wishes moving forward would be to be focus on comfort care.  After discussion with family, plan for is discontinuation of interventions not related to comfort, including antibiotics and TPN and addition of any interventions that will add to her comfort.  Orders for comfort care were updated using comfort care order set. I discussed with bedside nurse.  Will plan for initiation of morphine continuous infusion.  Will also plan for transition from NRB to nasal cannula.  I expect her prognosis to be a matter of hours to days and this will likely be a terminal admission.  Dr. Allyson Sabal notified via text page.  Micheline Rough, MD [place] Team [phone]

## 2015-10-03 NOTE — Discharge Summary (Addendum)
Death note  1754 Patient   Expired after being placed on morphine gtt by palliative care . Family at the bedside   HPI Autumn Turner is a 55 y.o. female was recently admitted to the hospital for cavitary pneumonia had followed up with Dr. Johnnye Sima in his clinic yesterday when patient was found to be very weak and was referred to the ER. Labs revealed acute renal failure with anemia with hemoglobin at her baseline. In addition patient is found to have a new rash involving the extremities. Patient was recently found to have increasing swelling of the left parotid over the last 2 days. The skin rash been coming off and on for last 1 week. Patient has history of gastric bypass and outlet obstruction and was admitted for nausea vomiting recently. During last admission patient's sputum grew MRSA and patient was placed on Zyvox and discharged on clindamycin. Patient during that stay also had thoracentesis done. Found to have MRSA bacteremia. Patient has been evaluated by Mady Gemma, MD, who recommended TPN for malnourishment, she's not a candidate for endoscopic placement of a J-tube given recent bacteremia. Patient understands that TPN is not a long-term option.  Assessment/Plan: Acute hypoxemic respiratory failure, HCAP Chest x-ray shows marked worsening of bilateral interstitial opacities, suspected  Aspiration, dramatic change in her clinical condition and is no longer responsive. She appears to be actively dying Patient has been on vancomycin and Zosyn since 1/12 Currently on a nonrebreather, patient has been made npo Palliative care following, no plans to place patient on BiPAP at this time, Micheline Rough, MD  met with her husband Autumn Turner), her son, her daughter, and her son-in-law. We talked about her clinical course to this point in time as well as the dramatic change that they have noticed from when they visited with her this morning. Her family is in agreement that her wishes moving  forward would be to be focus on comfort care.  MRSA bacteremia -IV vanc 6 weeks per ID,   -ID following, last note by Dr. Linus Salmons on 1/16 -echo-- family does not want TEE -blood cultures repeated and NGTD patient had a PICC line placed 1/18  Acute left parotitis, secondary to dehydration, continue vancomycin, ENT following   Acute renal failure probably from poor oral intake and dehydration -  now on TPN, slowly improving, but just potassium, magnesium along with TPN Renal function worsening   Skin rash: ? Related to MRSA bacteremia -improving on hands faster than legs  Left Parotid swellinig - ENT following currently on vancomycin, Zosyn   Leukocytosis 6.3>14.9> 24.8  Hypokalemia -repleted  -monitor Mg and phos for re-feeding syndrome  Anemia - 8.4>9.5 Transfuse for <7  Recent admission for cavitary pneumonia with MRSA  Severe Protein calorie malnutrition with history of gastric bypass -  -refused NG tube.-GI consulted for J tube candidacy   - GI recommended to start patient on TPN, Albumin < 1  Elevated alkaline phosphatase levels - cause not known. Closely observe.

## 2015-10-03 NOTE — Progress Notes (Signed)
Subjective: On O2 mask. Event last night noted.  Objective: Vital signs in last 24 hours: Temp:  [98.1 F (36.7 C)-98.3 F (36.8 C)] 98.2 F (36.8 C) (01/19 0524) Pulse Rate:  [87-121] 115 (01/19 0524) Resp:  [20-24] 22 (01/19 0524) BP: (119-130)/(78-92) 130/92 mmHg (01/19 0524) SpO2:  [73 %-95 %] 86 % (01/19 0524)  Physical Exam  Constitutional: She appears cachectic.  Head: Atraumatic.  Ears: Normal auricles and EACs. Nose/Mouth/Throat: Mucous membranes are dry.  Swelling, tenderness of the left parotid gland have significantly improved. Eyes: Conjunctivae are normal. Right eye exhibits no discharge. Left eye exhibits no discharge.  Neck: Normal range of motion. Neck supple.  Skin: Skin is warm and dry.    Recent Labs  09/19/15 0910 2015/10/06 0459  WBC 14.9* 24.8*  HGB 9.5* 9.4*  HCT 30.6* 30.0*  PLT 333 343    Recent Labs  09/19/15 0910 2015-10-06 0459  NA 142 144  K 3.6 3.7  CL 113* 116*  CO2 22 20*  GLUCOSE 113* 152*  BUN 17 21*  CREATININE 1.39* 1.62*  CALCIUM 8.2* 8.2*    Medications:  I have reviewed the patient's current medications. Scheduled: . antiseptic oral rinse  7 mL Mouth Rinse BID  . buPROPion  300 mg Oral Daily  . feeding supplement  1 Container Oral BID BM  . feeding supplement (ENSURE ENLIVE)  237 mL Oral BID BM  . insulin aspart  0-9 Units Subcutaneous Q6H  . LORazepam  0.5 mg Oral Once  . pantoprazole  40 mg Oral Daily  . piperacillin-tazobactam (ZOSYN)  IV  3.375 g Intravenous Q8H  . vancomycin  500 mg Intravenous Q24H  . vitamin B-12  1,000 mcg Oral Daily   [id number] **OR** acetaminophen, benzocaine, diphenhydrAMINE, levalbuterol, loratadine, LORazepam, morphine injection, ondansetron **OR** ondansetron (ZOFRAN) IV, oxyCODONE, promethazine, senna-docusate, sodium chloride, traMADol  Assessment/Plan: Acute left parotitis, secondary to severe dehydration -- much improved. Now with possible pneumonia. Continue IV fluid  and abx.    LOS: 8 days   Autumn Turner,SUI W October 06, 2015, 8:05 AM

## 2015-10-03 NOTE — Progress Notes (Signed)
PROGRESS NOTE  Autumn Turner [id number] DOB: Apr 25, 1961 DOA: 09/15/2015 PCP: Robyn Haber, MD    Autumn Turner is a 55 y.o. female was recently admitted to the hospital for cavitary pneumonia had followed up with Dr. Johnnye Sima in his clinic yesterday when patient was found to be very weak and was referred to the ER. Labs revealed acute renal failure with anemia with hemoglobin at her baseline. In addition patient is found to have a new rash involving the extremities.   Patient was recently found to have increasing swelling of the left parotid over the last 2 days. The skin rash been coming off and on for last 1 week. Patient has history of gastric bypass and outlet obstruction and was admitted for nausea vomiting recently. During last admission patient's sputum grew MRSA and patient was placed on Zyvox and discharged on clindamycin. Patient during that stay also had thoracentesis done.  Found to have MRSA bacteremia.  Patient has been evaluated by Mady Gemma, MD, who recommended TPN for malnourishment, she's not a candidate for endoscopic placement of a J-tube given recent bacteremia. Patient understands that TPN is not a long-term option.  Assessment/Plan: Acute hypoxemic respiratory failure, HCAP Chest x-ray shows marked worsening of bilateral interstitial opacities, suspect aspiration Patient has been on vancomycin and Zosyn since 1/12 Currently on a nonrebreather, patient has been made npo Palliative care following, no plans to place patient on BiPAP at this time, Continue current therapy for next 24-48 hours versus transition to comfort care  MRSA bacteremia -IV vanc 6 weeks per ID, request pharmacy to calculate stop date -ID following, last note by Dr. Linus Salmons  on 1/16 -echo-- family does not want TEE -blood cultures repeated and NGTD  patient has a PICC line  Acute left parotitis, secondary to dehydration, continue vancomycin  Acute renal failure probably from poor oral  intake and dehydration -  now on TPN, slowly improving, but just potassium, magnesium along with TPN Renal function worsening  Skin rash: ? Related to MRSA bacteremia -improving on hands faster than legs  Left Parotid swellinig - ENT following  currently on vancomycin, Zosyn    Leukocytosis 6.3>14.9> 24.8  Hypokalemia -repleted   -monitor Mg and phos for re-feeding syndrome  Anemia - 8.4>9.5 Transfuse for <7  Recent admission for cavitary pneumonia with MRSA  Protein calorie malnutrition with history of gastric bypass - nutrition consult. -does NOT want NG tube -GI doc has mentioned J tube in past-  GI recommended to start patient on TPN Albumin < 1  Elevated alkaline phosphatase levels - cause not known. Closely observe.  pseudomonas in urine culture -<10,000 colonies    appreciate palliative care  Code Status: DNR Family Communication: patient/husband/daughter/son at beside 1/17 Disposition Plan: not  stable for discharge, comfort care versus continue current medical treatment without escalation   Consultants:  ID  Palliative care  PT  Procedures:   Anti-infectives    Start     Dose/Rate Route Frequency Ordered Stop   Oct 04, 2015 0600  piperacillin-tazobactam (ZOSYN) IVPB 3.375 g     3.375 g 12.5 mL/hr over 240 Minutes Intravenous Every 8 hours 09/19/15 2303     09/19/15 2315  piperacillin-tazobactam (ZOSYN) IVPB 3.375 g     3.375 g 100 mL/hr over 30 Minutes Intravenous  Once 09/19/15 2303 10-04-2015 0006   09/14/15 0100  piperacillin-tazobactam (ZOSYN) IVPB 3.375 g  Status:  Discontinued     3.375 g 12.5 mL/hr over 240 Minutes Intravenous Every 8 hours 09/13/15 2122 09/16/15  1809   09/13/15 2200  vancomycin (VANCOCIN) 500 mg in sodium chloride 0.9 % 100 mL IVPB     500 mg 100 mL/hr over 60 Minutes Intravenous Every 24 hours 09/13/15 0245     09/13/15 1530  acyclovir (ZOVIRAX) 250 mg in dextrose 5 % 100 mL IVPB  Status:  Discontinued     250 mg 105  mL/hr over 60 Minutes Intravenous Every 12 hours 09/13/15 1518 09/14/15 0903   09/13/15 0300  piperacillin-tazobactam (ZOSYN) IVPB 3.375 g  Status:  Discontinued     3.375 g 12.5 mL/hr over 240 Minutes Intravenous 3 times per day 09/13/15 0245 09/13/15 2119   09/30/2015 2315  vancomycin (VANCOCIN) IVPB 1000 mg/200 mL premix     1,000 mg 200 mL/hr over 60 Minutes Intravenous NOW 09/18/2015 2303 09/13/15 0143        HPI/Subjective:  Overnight events noted. Patient on non-rebreather this morning. She is awake and interactive but difficult to understand. I believe that some level of confusion is contributing to this.  Objective: Filed Vitals:   09-23-2015 0021 09/23/2015 0524  BP:  130/92  Pulse: 121 115  Temp:  98.2 F (36.8 C)  Resp:  22    Intake/Output Summary (Last 24 hours) at 09/23/15 1148 Last data filed at 23-Sep-2015 0616  Gross per 24 hour  Intake    592 ml  Output    400 ml  Net    192 ml   Filed Weights   09/13/15 1406  Weight: 47.945 kg (105 lb 11.2 oz)    Exam:   General:  Awake, ill appearing, enlarged left parotid gland  Cardiovascular: rrr  Respiratory: clear  Abdomen: thin, + BS  Musculoskeletal: + edema  Data Reviewed: Basic Metabolic Panel:  Recent Labs Lab 09/15/15 0605 09/16/15 0601 09/16/15 1840 09/17/15 0622 09/18/15 0814 09/19/15 0910 09/23/2015 0459  NA 135 141  --  142  --  142 144  K 3.5 2.8* 3.2* 3.1*  --  3.6 3.7  CL 100* 108  --  112*  --  113* 116*  CO2 24 26  --  25  --  22 20*  GLUCOSE 54* 77  --  70  --  113* 152*  BUN 32* 24*  --  20  --  17 21*  CREATININE 1.58* 1.45*  --  1.46*  --  1.39* 1.62*  CALCIUM 8.2* 8.1*  --  8.1*  --  8.2* 8.2*  MG 1.4* 1.9  --  1.7 1.5* 1.9 1.8  PHOS 4.1 3.8  --  3.7 3.5 3.2 4.3   Liver Function Tests:  Recent Labs Lab 09/14/15 0658 09/15/15 0605 09/19/15 0910 09-23-2015 0459  AST 15 21 16 16   ALT 14 13* 12* 12*  ALKPHOS 283* 287* 252* 247*  BILITOT 0.6 1.0 0.2* 0.4  PROT 4.9* 4.6*  5.0* 5.1*  ALBUMIN <1.0* <1.0* <1.0* <1.0*   No results for input(s): LIPASE, AMYLASE in the last 168 hours. No results for input(s): AMMONIA in the last 168 hours. CBC:  Recent Labs Lab 09/15/15 0605 09/16/15 0601 09/17/15 0622 09/19/15 0910 09-23-15 0459  WBC 15.0* 8.1 6.3 14.9* 24.8*  NEUTROABS  --   --   --   --  22.5*  HGB 8.6* 8.5* 8.4* 9.5* 9.4*  HCT 26.9* 26.2* 26.4* 30.6* 30.0*  MCV 84.3 84.5 84.1 85.5 87.7  PLT 375 322 339 333 343   Cardiac Enzymes: No results for input(s): CKTOTAL, CKMB, CKMBINDEX, TROPONINI in the last  168 hours. BNP (last 3 results) No results for input(s): BNP in the last 8760 hours.  ProBNP (last 3 results) No results for input(s): PROBNP in the last 8760 hours.  CBG:  Recent Labs Lab 09/14/15 0957 09/15/15 0847 Oct 04, 2015 0830  GLUCAP 78 83 205*    Recent Results (from the past 240 hour(s))  Culture, blood (Routine X 2) w Reflex to ID Panel     Status: None   Collection Time: 09/05/2015  9:17 PM  Result Value Ref Range Status   Specimen Description BLOOD RIGHT ARM  Final   Special Requests IN PEDIATRIC BOTTLE 2CC  Final   Culture NO GROWTH 5 DAYS  Final   Report Status 09/17/2015 FINAL  Final  Culture, blood (Routine X 2) w Reflex to ID Panel     Status: None   Collection Time: 09/11/2015  9:30 PM  Result Value Ref Range Status   Specimen Description BLOOD LEFT ARM  Final   Special Requests BOTTLES DRAWN AEROBIC AND ANAEROBIC 5CC  Final   Culture  Setup Time   Final    GRAM POSITIVE COCCI IN CLUSTERS AEROBIC BOTTLE ONLY CRITICAL RESULT CALLED TO, READ BACK BY AND VERIFIED WITHHenrietta Dine RN [id number] 09/13/15 A BROWNING    Culture METHICILLIN RESISTANT STAPHYLOCOCCUS AUREUS  Final   Report Status 09/15/2015 FINAL  Final   Organism ID, Bacteria METHICILLIN RESISTANT STAPHYLOCOCCUS AUREUS  Final      Susceptibility   Methicillin resistant staphylococcus aureus - MIC*    CIPROFLOXACIN <=0.5 SENSITIVE Sensitive     ERYTHROMYCIN >=8  RESISTANT Resistant     GENTAMICIN <=0.5 SENSITIVE Sensitive     OXACILLIN >=4 RESISTANT Resistant     TETRACYCLINE <=1 SENSITIVE Sensitive     VANCOMYCIN 1 SENSITIVE Sensitive     TRIMETH/SULFA <=10 SENSITIVE Sensitive     CLINDAMYCIN <=0.25 SENSITIVE Sensitive     RIFAMPIN <=0.5 SENSITIVE Sensitive     Inducible Clindamycin NEGATIVE Sensitive     * METHICILLIN RESISTANT STAPHYLOCOCCUS AUREUS  Urine culture     Status: None   Collection Time: 09/06/2015 10:22 PM  Result Value Ref Range Status   Specimen Description URINE, CATHETERIZED  Final   Special Requests NONE  Final   Culture 10,000 COLONIES/mL PSEUDOMONAS AERUGINOSA  Final   Report Status 09/15/2015 FINAL  Final   Organism ID, Bacteria PSEUDOMONAS AERUGINOSA  Final      Susceptibility   Pseudomonas aeruginosa - MIC*    CEFTAZIDIME 4 SENSITIVE Sensitive     CIPROFLOXACIN <=0.25 SENSITIVE Sensitive     GENTAMICIN 2 SENSITIVE Sensitive     IMIPENEM 2 SENSITIVE Sensitive     PIP/TAZO 8 SENSITIVE Sensitive     CEFEPIME 4 SENSITIVE Sensitive     * 10,000 COLONIES/mL PSEUDOMONAS AERUGINOSA  MRSA PCR Screening     Status: Abnormal   Collection Time: 09/13/15  2:21 PM  Result Value Ref Range Status   MRSA by PCR POSITIVE (A) NEGATIVE Final    Comment: RESULT CALLED TO, READ BACK BY AND VERIFIED WITH: Donnal Debar RN 17:25 09/13/15 ([email or web address])        The GeneXpert MRSA Assay (FDA approved for NASAL specimens only), is one component of a comprehensive MRSA colonization surveillance program. It is not intended to diagnose MRSA infection nor to guide or monitor treatment for MRSA infections.   Culture, blood (routine x 2)     Status: None   Collection Time: 09/14/15  2:36 PM  Result Value Ref Range Status  Specimen Description BLOOD LEFT HAND  Final   Special Requests BOTTLES DRAWN AEROBIC AND ANAEROBIC 10CC  Final   Culture NO GROWTH 5 DAYS  Final   Report Status 09/19/2015 FINAL  Final  Culture, blood (routine x 2)      Status: None   Collection Time: 09/14/15  2:39 PM  Result Value Ref Range Status   Specimen Description BLOOD RIGHT HAND  Final   Special Requests BOTTLES DRAWN AEROBIC AND ANAEROBIC 10CC  Final   Culture NO GROWTH 5 DAYS  Final   Report Status 09/19/2015 FINAL  Final     Studies: Dg Chest Port 1 View  09/19/2015  CLINICAL DATA:  55 year old female with severe shortness of breath and dry cough starting today. EXAM: PORTABLE CHEST 1 VIEW COMPARISON:  Chest x-ray 09/11/2015. FINDINGS: There is a right upper extremity PICC with tip terminating in the superior cavoatrial junction. Widespread interstitial and airspace opacities throughout the lungs bilaterally, slightly asymmetric involving the left lung to a greater extent than the right. Moderate right and small left pleural effusions. Chronic scarring and large cavitary area in the right upper lobe is again noted. Pulmonary vasculature is obscured. Heart size appears borderline enlarged, likely accentuated by portable AP technique. Upper mediastinal contours are within normal limits. Surgical clips projecting over the left upper quadrant of the abdomen. IMPRESSION: 1. Marked interval worsening of widespread interstitial and airspace disease throughout the lungs bilaterally. Given the borderline cardiac enlargement, this could represent congestive heart failure, however, the appearance is somewhat asymmetric involving the left lung to a greater extent than the right, which may favor severe multilobar pneumonia. Clinical correlation is recommended. 2. Moderate right and small left pleural effusions. Electronically Signed   By: Vinnie Langton M.D.   On: 09/19/2015 22:11    Scheduled Meds: . antiseptic oral rinse  7 mL Mouth Rinse BID  . buPROPion  300 mg Oral Daily  . feeding supplement  1 Container Oral BID BM  . feeding supplement (ENSURE ENLIVE)  237 mL Oral BID BM  . insulin aspart  0-9 Units Subcutaneous 6 times per day  . LORazepam  0.5 mg  Oral Once  . pantoprazole  40 mg Oral Daily  . piperacillin-tazobactam (ZOSYN)  IV  3.375 g Intravenous Q8H  . vancomycin  500 mg Intravenous Q24H  . vitamin B-12  1,000 mcg Oral Daily   Continuous Infusions: . dextrose 5 % and 0.9 % NaCl with KCl 20 mEq/L 30 mL/hr at 10/19/15 0958  . dextrose 5 % and 0.9% NaCl    . Marland KitchenTPN (CLINIMIX-E) Adult     And  . fat emulsion    . Marland KitchenTPN (CLINIMIX-E) Adult 20 mL/hr at 09/19/15 1741   And  . fat emulsion 72 mL (09/19/15 1740)     Principal Problem:   AKI (acute kidney injury) ([place]) Active Problems:   Hypokalemia   Protein-calorie malnutrition, severe   Pressure ulcer   Leukocytoclastic vasculitis ([place])   Acute parotitis   ARF (acute renal failure) ([place])   Staphylococcus aureus bacteremia   Dehydration   Encounter for palliative care   Jaw pain   Goals of care, counseling/discussion    Time spent: 25 min    [facility] Pager [phone]. If 7PM-7AM, please contact night-coverage at [email or web address], password [facility] 19-Oct-2015, 11:48 AM  LOS: 8 days

## 2015-10-03 NOTE — Progress Notes (Signed)
Pt. Is now comfort care via MD. Morphine drip started earlier, nasal cannula for comfort. Family informed about comfort care, will reposition per family request. Patient appears to be resting comfortably at this time. Family at bedside at this time.

## 2015-10-03 NOTE — Progress Notes (Signed)
Simon Rhein, NP on patient.

## 2015-10-03 NOTE — Progress Notes (Signed)
Nutrition Follow-up  DOCUMENTATION CODES:   Severe malnutrition in context of chronic illness, Underweight  INTERVENTION:   -TPN management per pharmacy  NUTRITION DIAGNOSIS:   Inadequate oral intake related to altered GI function as evidenced by per patient/family report.  Ongoing  GOAL:   Patient will meet greater than or equal to 90% of their needs  Progressing  MONITOR:   Diet advancement, Labs, Weight trends, Skin, I & O's  REASON FOR ASSESSMENT:   Malnutrition Screening Tool    ASSESSMENT:   Autumn Turner is a 55 y.o. female was recently admitted to the hospital for cavitary pneumonia had followed up with Dr. Johnnye Sima in his clinic yesterday when patient was found to be very weak and was referred to the ER. Labs revealed acute renal failure with anemia with hemoglobin at her baseline. In addition patient is found to have a new rash involving the extremities  Pt admitted with acute left parotitis, likely secondary to dehydration. She has been receiving treatment via IVF and IV antibiotics.   Pt's intake continues to be poor; is now NPO. GI consulted for potential j-tube placement, however, pt was deemed not a candidate for enteral nutrition due to malnourishment and high surgical risk. PICC placed on 1/181/7. TPN was initiated on 09/19/15, as a short term option to optimize nutrition; pt currently receiving Clinimix E5/15 at 72ml/hr and IVFE at 110ml/hr, which provides 24g of protein and ~600 kcal which meets ~33% of protein needs and ~38% of kcal needs). Per pharmacy note, plan to increase slowly due to high refeeding risk.   Palliative care team following for [place]. Pt has been experiencing declining respiratory status. Per PCT note on 19-Oct-2015, this will likely be a terminal event with transition to comfort care. MD has reached out to family to discuss plans moving forward.   Labs reviewed.   Diet Order:  TPN (CLINIMIX-E) Adult Diet NPO time specified TPN (CLINIMIX-E)  Adult  Skin:  Wound (see comment) (4 full thickness wounds across bilateral buttocks)  Last BM:  09/13/15  Height:   Ht Readings from Last 1 Encounters:  09/13/15 5\' 4"  (1.626 m)    Weight:   Wt Readings from Last 1 Encounters:  09/13/15 105 lb 11.2 oz (47.945 kg)    Ideal Body Weight:  54.5 kg  BMI:  Body mass index is 18.13 kg/(m^2).  Estimated Nutritional Needs:   Kcal:  1500-1700  Protein:  60-75 grams  Fluid:  1.5-1.7 L  EDUCATION NEEDS:   No education needs identified at this time  Ahnaf Caponi A. Jimmye Norman, RD, LDN, CDE Pager: [phone] After hours Pager: [phone]

## 2015-10-03 DEATH — deceased

## 2017-02-11 NOTE — Telephone Encounter (Signed)
error 

## 2017-08-24 IMAGING — DX DG CHEST 2V
2 series · 2 of 2 positions shown · non-contrast
Comparison: Most recent prior chest x-ray 12/03/2012 ; abdominal
radiograph including the lower lungs 07/31/2015

CLINICAL DATA: 54-year-old female with productive cough and recent
fevers

EXAM:
CHEST  2 VIEW

[w chest lat]
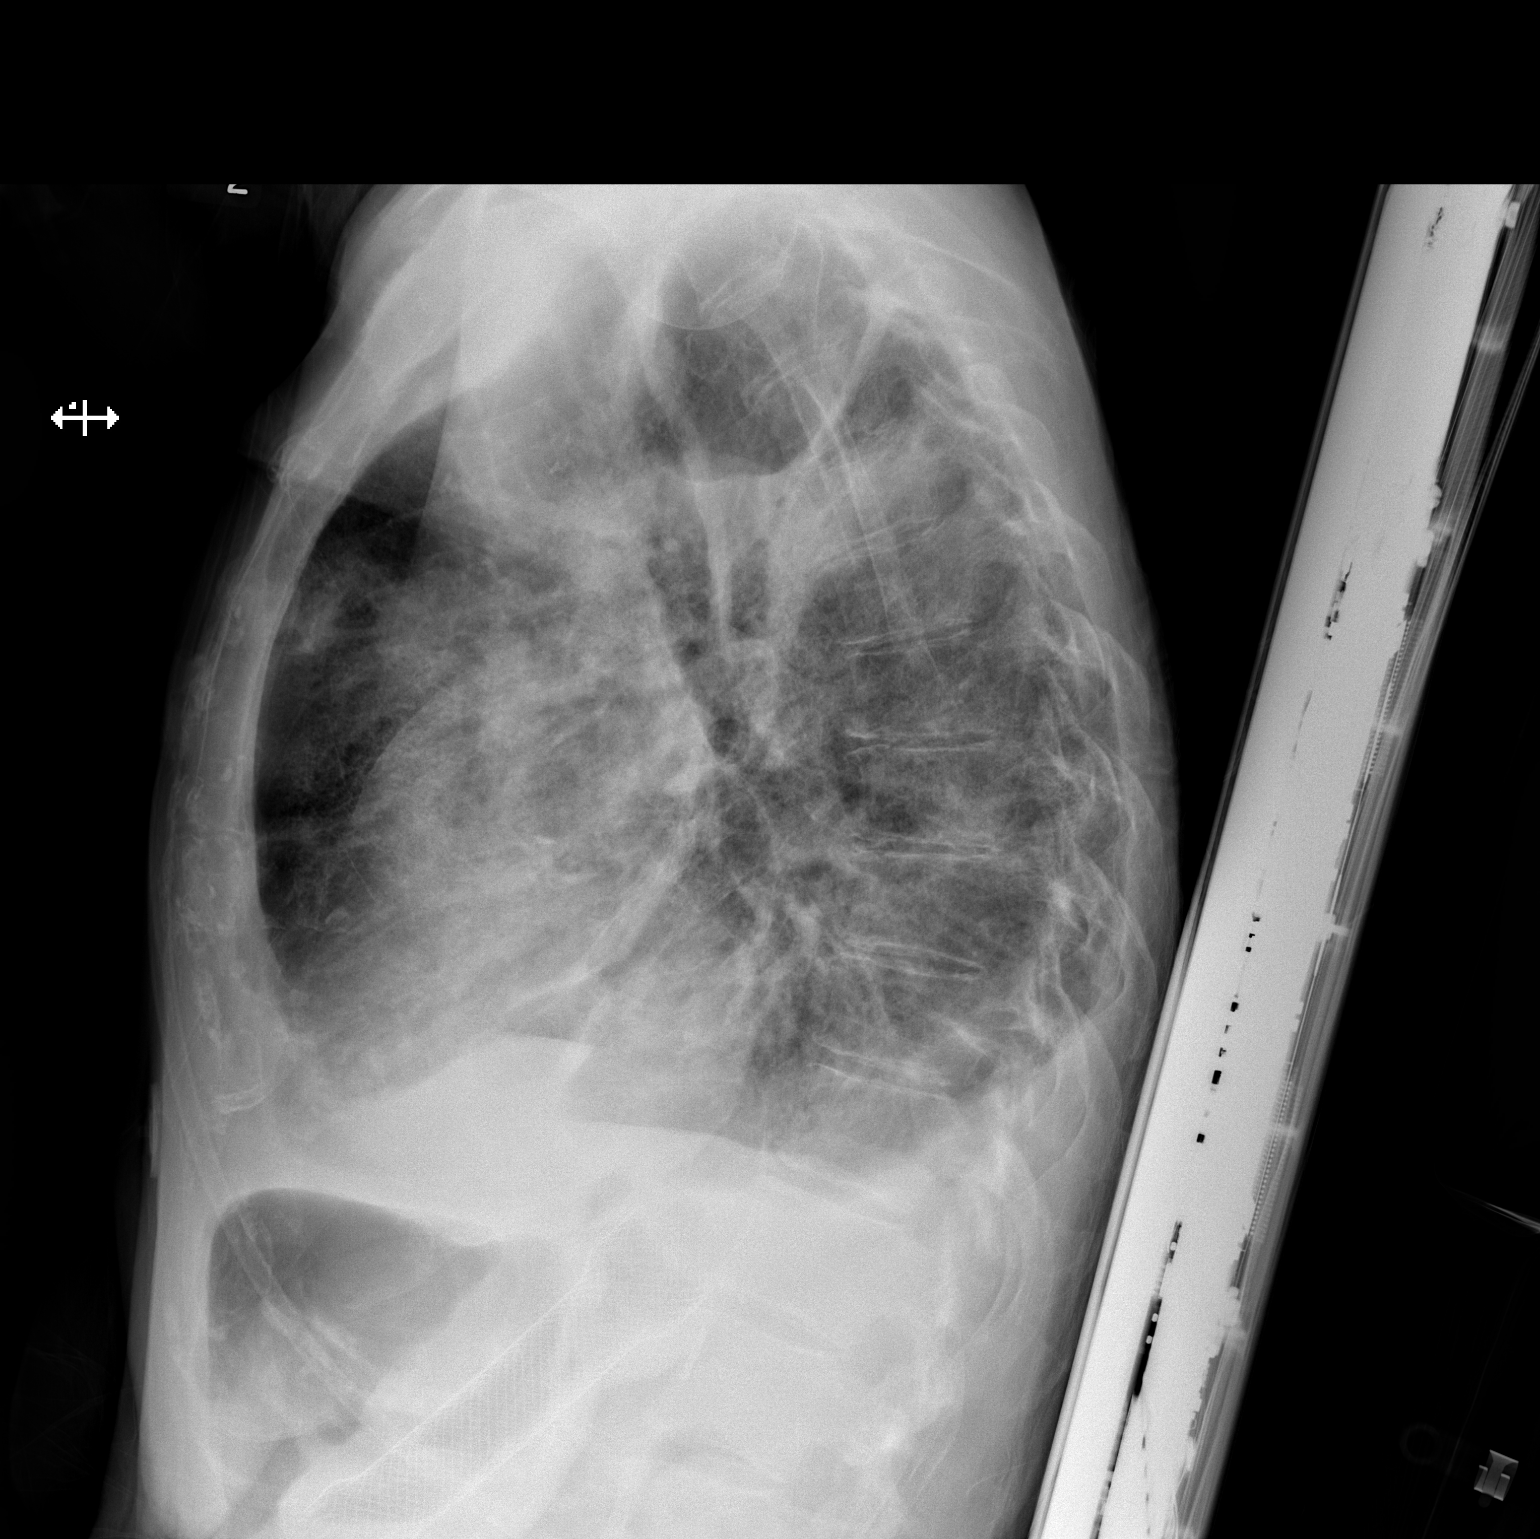

[x chest ap]
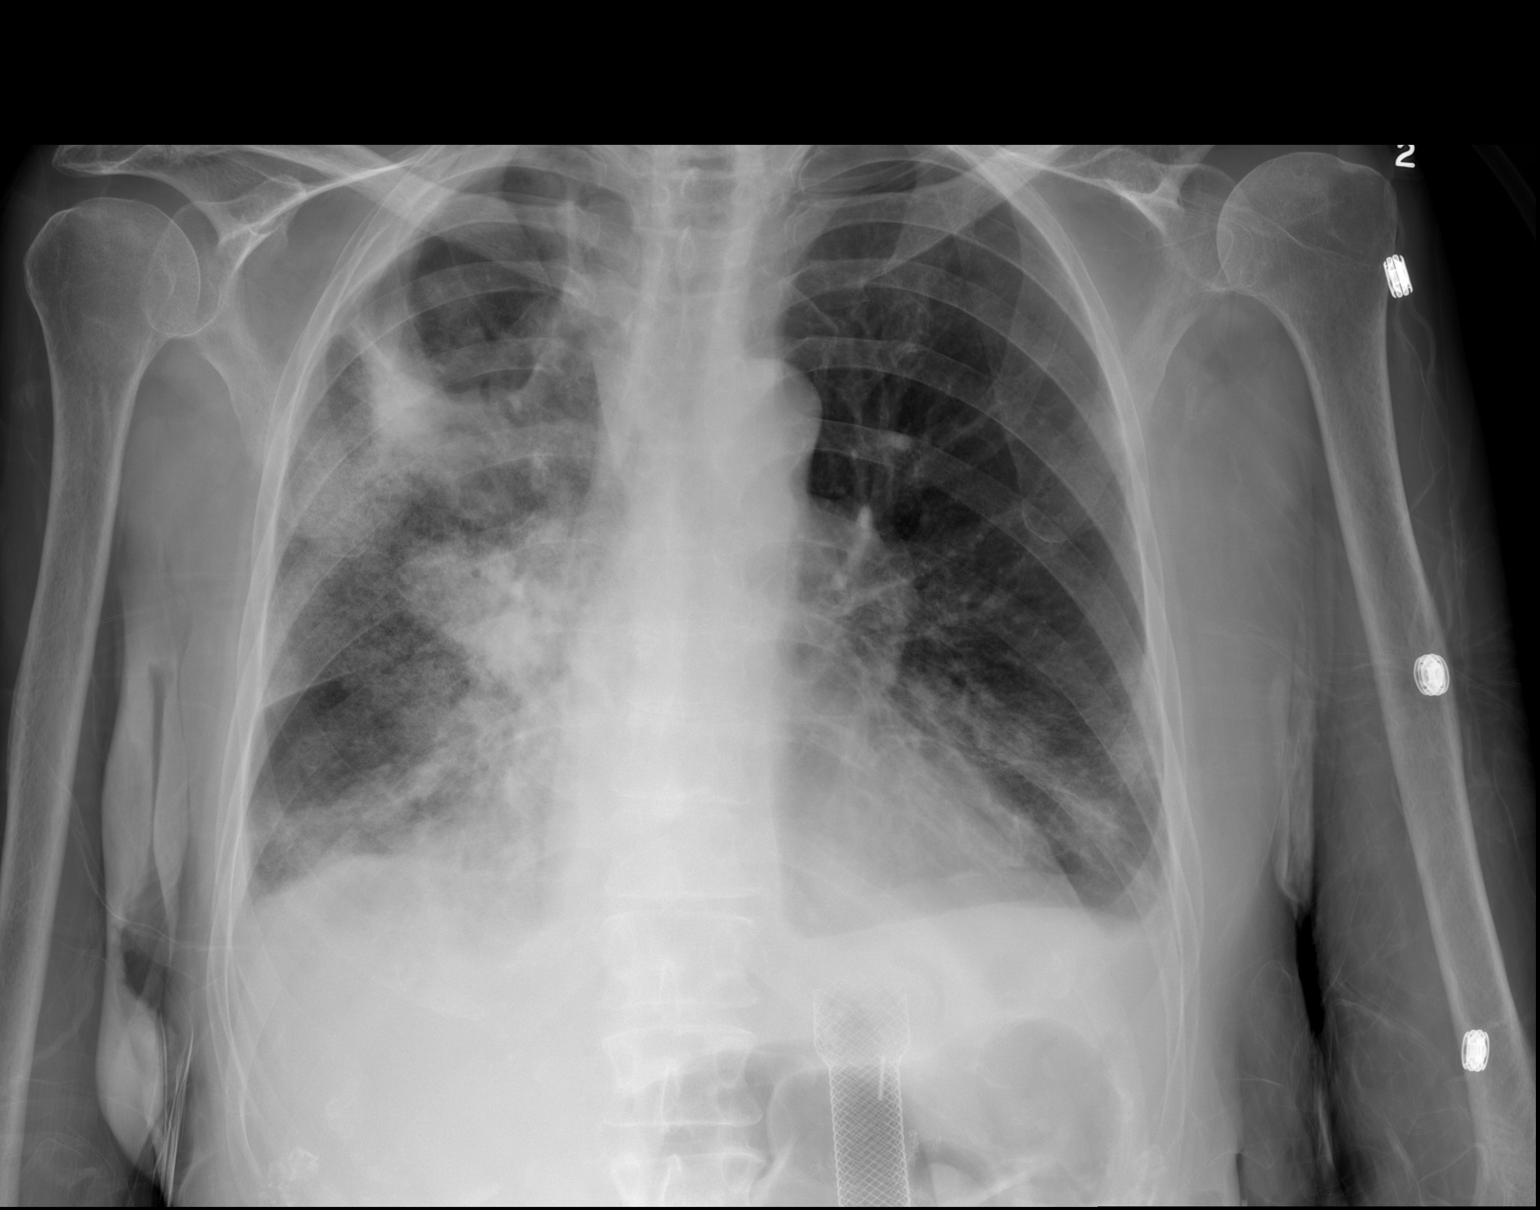

[2 of 2 positions shown; findings below may reference images not displayed]

FINDINGS: Interval development of multifocal patchy ground-glass attenuation
opacity with areas of increased confluence in the right upper, right
middle and right lower lobes. More mild patchy airspace opacity is
also present within the left lower lobe. There are small bilateral
pleural effusions. A rounded lucency in the right lung apex raises
concern for underlying cavitation or bulla formation. The
parenchymal findings represent a dramatic change compared to the
relatively recent abdominal radiographs dated 07/31/2015.
Incompletely imaged stent again noted over the left upper abdomen.
No acute osseous abnormality.
IMPRESSION: 1. Interval development of multifocal airspace consolidation
throughout the right lung, and to a lesser extent in the left lower
lobe compared to 07/31/2015. Findings are concerning for multi lobar
pneumonia, or possibly aspiration given the presence of the
esophageal stent.
2. A rounded lucency overlying the right lung apex may represent a
geographic region of relatively spared lung, or possibly developing
cavitation or bulla formation. Recommend attention on follow-up
chest radiographs. If the abnormality persists, CT scan of the chest
may become warranted.
3. Small bilateral pleural effusions.

## 2017-08-25 IMAGING — CR DG CHEST 1V PORT
1 series · 1 of 1 positions shown · non-contrast
Comparison: 08/13/2015

CLINICAL DATA: Status post right-sided thoracentesis

EXAM:
PORTABLE CHEST - 1 VIEW

[AP]
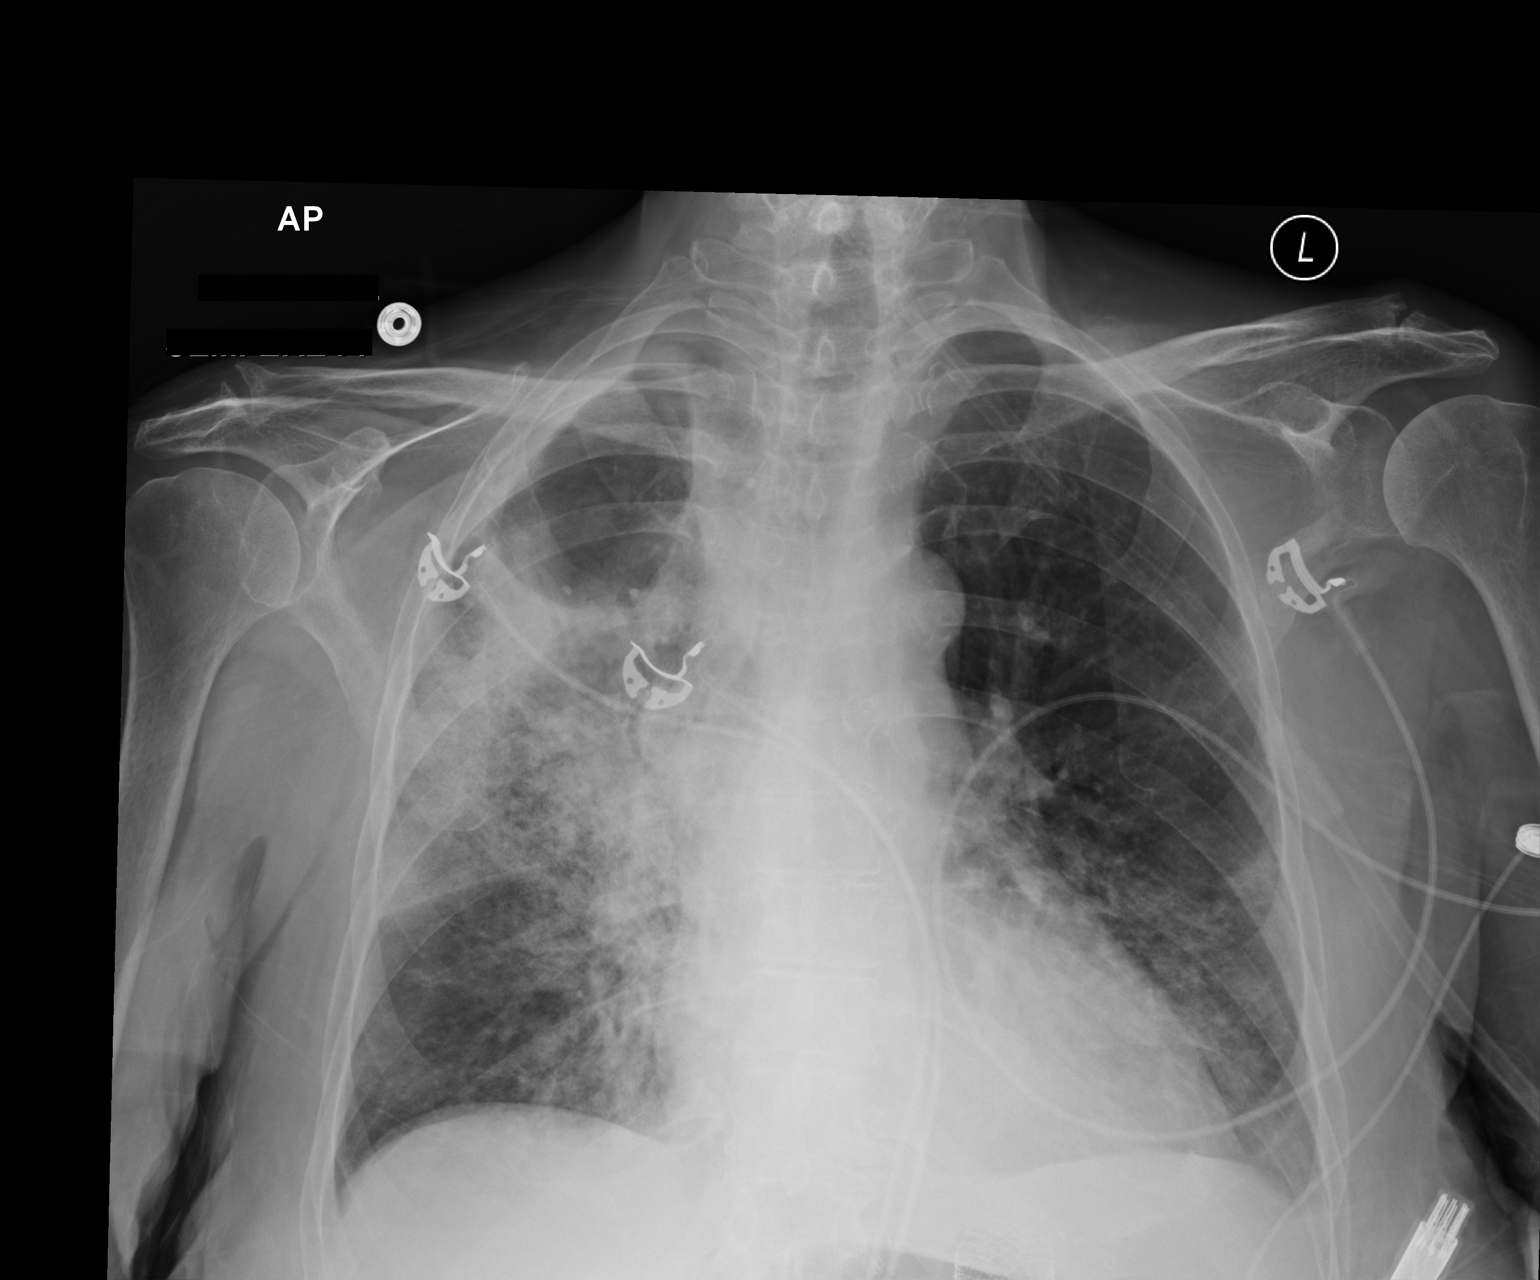

[1 of 1 positions shown; findings below may reference images not displayed]

FINDINGS: Cardiac shadow is stable. The lungs are well aerated bilaterally.
Persistent infiltrate is noted throughout the mid right lung with
cavitary area superiorly similar to that noted on prior
examinations. No pneumothorax is noted following thoracentesis. Note
is made of a previously placed esophageal stent which now lies
within the stomach.
IMPRESSION: Persistent infiltrative change and cavitary changes in the right
lung. No pneumothorax is noted following thoracentesis.

## 2017-08-27 IMAGING — CR DG CHEST 2V
2 series · 2 of 2 positions shown · non-contrast
Comparison: Portable chest x-ray August 14, 2015

CLINICAL DATA: Cough, shortness of breath, pneumonia.

EXAM:
CHEST  2 VIEW

[chest lat]
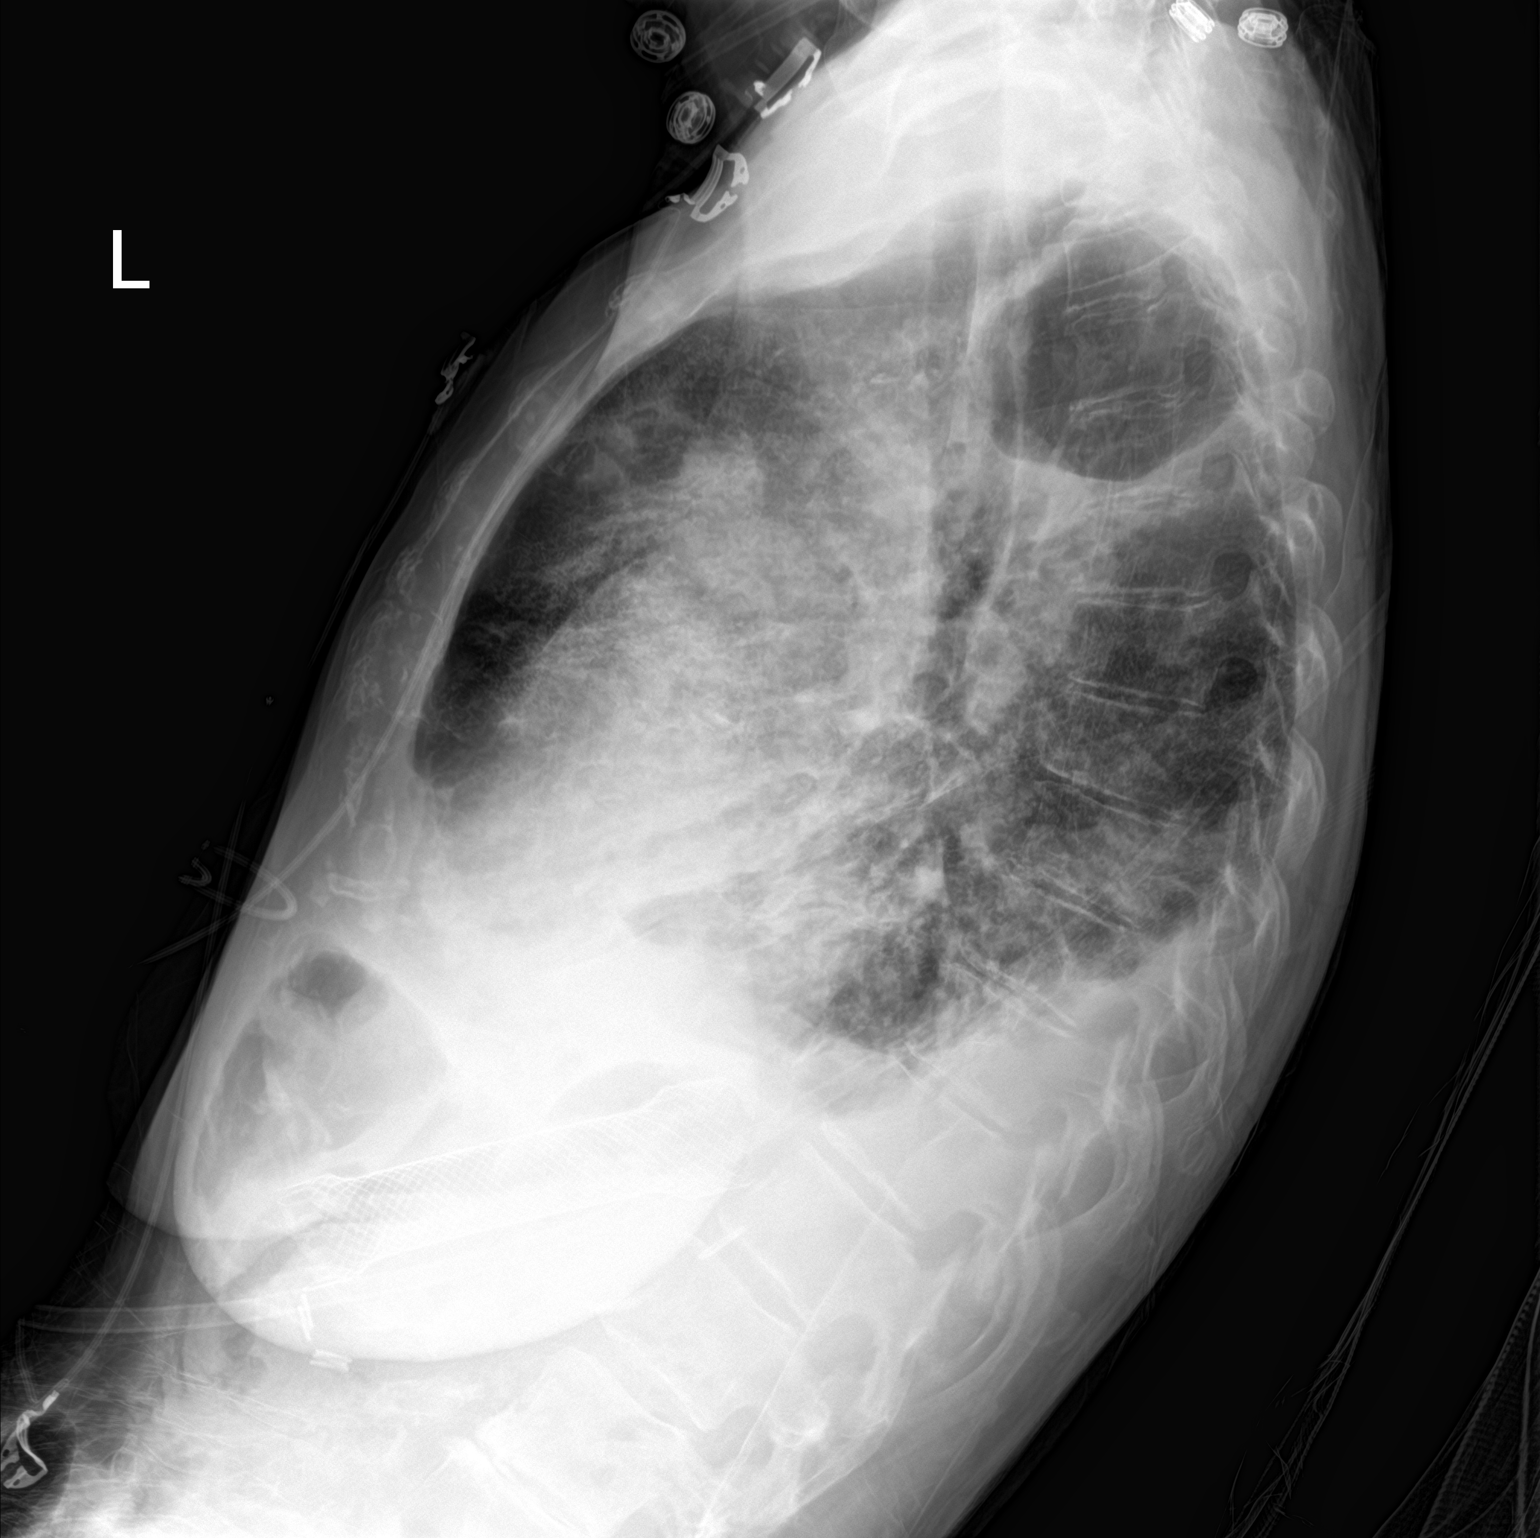

[chest ap]
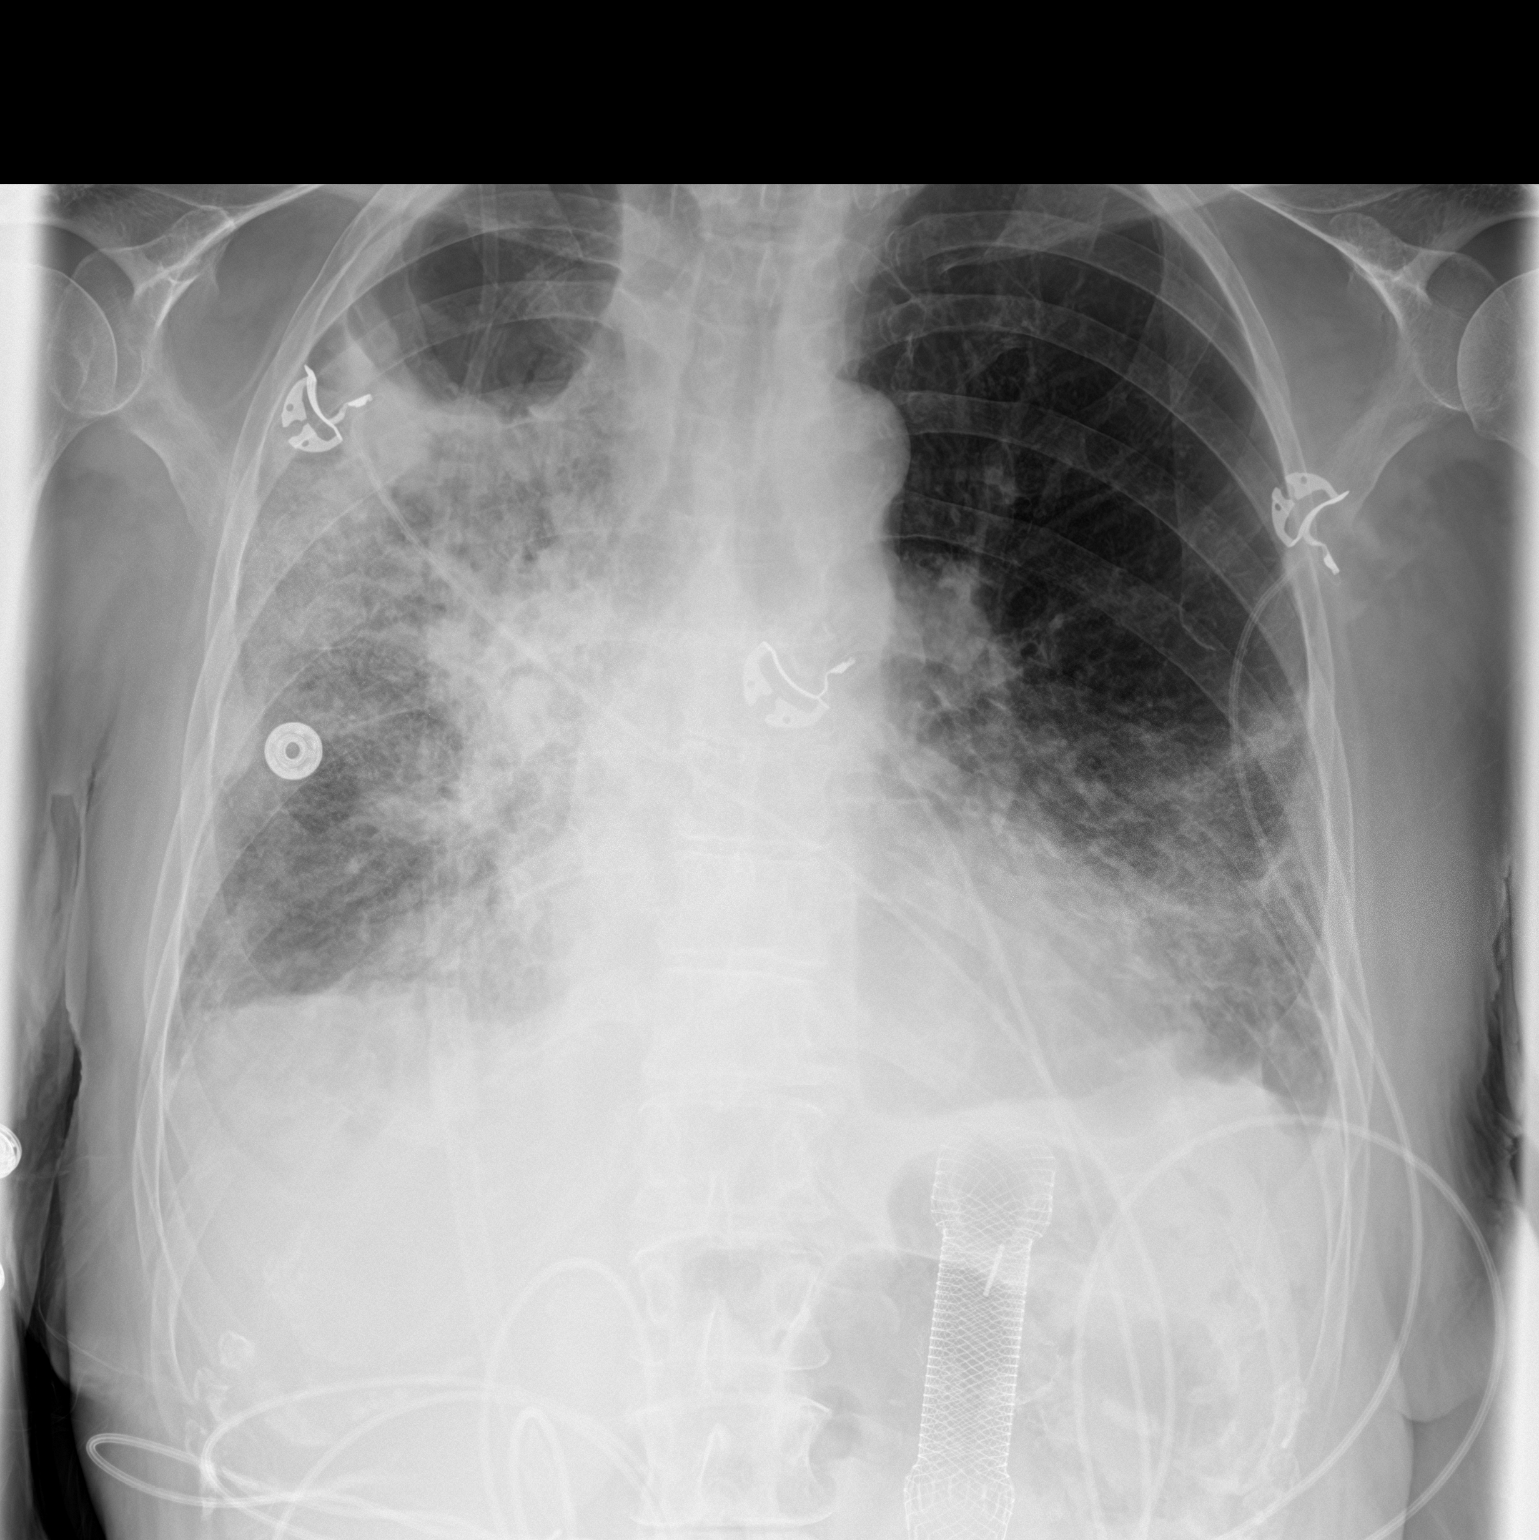

[2 of 2 positions shown; findings below may reference images not displayed]

FINDINGS: On the right the confluent interstitial and alveolar opacities in
the mid and lower lung are stable. There is a stable cavity in the
right upper lobe. There are slightly increased interstitial
densities in the left mid and lower lung. There is a small left
pleural effusion. The heart is not enlarged. There is stable right
hilar prominence. A stent like device is visible in the left upper
quadrant of the abdomen and its positioning has been described on
the previous CT scan.
IMPRESSION: 1. Stable airspace opacity on the right consistent with pneumonia. A
known necrotic cavitary mass in the right upper lobe is stable.
Slight interval increase in interstitial densities in the left mid
and lower lung may reflect progressive pneumonia here.
2. There is no pulmonary vascular congestion. There is a small left
pleural effusion.
